# Patient Record
Sex: Female | Born: 1950 | Race: White | Hispanic: No | Smoking: Current every day smoker
Health system: Southern US, Community
[De-identification: ages and names within clinical notes are randomized; demographics above are authoritative.]

## PROBLEM LIST (undated history)

## (undated) DIAGNOSIS — I10 Essential (primary) hypertension: Secondary | ICD-10-CM

## (undated) DIAGNOSIS — E785 Hyperlipidemia, unspecified: Secondary | ICD-10-CM

## (undated) DIAGNOSIS — F419 Anxiety disorder, unspecified: Secondary | ICD-10-CM

## (undated) DIAGNOSIS — F329 Major depressive disorder, single episode, unspecified: Secondary | ICD-10-CM

## (undated) DIAGNOSIS — F32A Depression, unspecified: Secondary | ICD-10-CM

## (undated) DIAGNOSIS — M1990 Unspecified osteoarthritis, unspecified site: Secondary | ICD-10-CM

## (undated) DIAGNOSIS — F4321 Adjustment disorder with depressed mood: Secondary | ICD-10-CM

## (undated) HISTORY — PX: CHOLECYSTECTOMY: SHX55

---

## 2003-03-27 ENCOUNTER — Emergency Department (HOSPITAL_COMMUNITY): Admission: EM | Admit: 2003-03-27 | Discharge: 2003-03-27 | Payer: Self-pay | Admitting: Emergency Medicine

## 2006-02-13 ENCOUNTER — Encounter: Admission: RE | Admit: 2006-02-13 | Discharge: 2006-02-13 | Payer: Self-pay | Admitting: Specialist

## 2006-03-06 ENCOUNTER — Encounter: Admission: RE | Admit: 2006-03-06 | Discharge: 2006-03-06 | Payer: Self-pay | Admitting: Family Medicine

## 2007-01-22 ENCOUNTER — Encounter: Admission: RE | Admit: 2007-01-22 | Discharge: 2007-01-22 | Payer: Self-pay | Admitting: Family Medicine

## 2007-04-12 ENCOUNTER — Encounter: Admission: RE | Admit: 2007-04-12 | Discharge: 2007-04-12 | Payer: Self-pay | Admitting: Family Medicine

## 2007-04-30 ENCOUNTER — Encounter: Admission: RE | Admit: 2007-04-30 | Discharge: 2007-04-30 | Payer: Self-pay | Admitting: Family Medicine

## 2007-08-13 ENCOUNTER — Inpatient Hospital Stay (HOSPITAL_COMMUNITY): Admission: EM | Admit: 2007-08-13 | Discharge: 2007-08-20 | Payer: Self-pay | Admitting: Emergency Medicine

## 2007-09-13 ENCOUNTER — Encounter: Admission: RE | Admit: 2007-09-13 | Discharge: 2007-09-13 | Payer: Self-pay | Admitting: Family Medicine

## 2008-04-24 ENCOUNTER — Emergency Department (HOSPITAL_COMMUNITY): Admission: EM | Admit: 2008-04-24 | Discharge: 2008-04-24 | Payer: Self-pay | Admitting: Emergency Medicine

## 2008-05-06 ENCOUNTER — Encounter: Admission: RE | Admit: 2008-05-06 | Discharge: 2008-05-06 | Payer: Self-pay | Admitting: Family Medicine

## 2008-10-20 ENCOUNTER — Encounter: Admission: RE | Admit: 2008-10-20 | Discharge: 2008-10-20 | Payer: Self-pay | Admitting: Family Medicine

## 2008-11-19 ENCOUNTER — Encounter: Admission: RE | Admit: 2008-11-19 | Discharge: 2008-11-19 | Payer: Self-pay | Admitting: Family Medicine

## 2008-11-19 ENCOUNTER — Inpatient Hospital Stay (HOSPITAL_COMMUNITY): Admission: EM | Admit: 2008-11-19 | Discharge: 2008-11-22 | Payer: Self-pay | Admitting: Emergency Medicine

## 2008-11-19 ENCOUNTER — Ambulatory Visit: Payer: Self-pay | Admitting: Cardiology

## 2008-11-21 ENCOUNTER — Encounter (INDEPENDENT_AMBULATORY_CARE_PROVIDER_SITE_OTHER): Payer: Self-pay | Admitting: Internal Medicine

## 2008-12-12 ENCOUNTER — Encounter: Admission: RE | Admit: 2008-12-12 | Discharge: 2008-12-12 | Payer: Self-pay | Admitting: Family Medicine

## 2009-07-08 ENCOUNTER — Encounter: Admission: RE | Admit: 2009-07-08 | Discharge: 2009-07-08 | Payer: Self-pay | Admitting: Family Medicine

## 2009-11-09 ENCOUNTER — Emergency Department (HOSPITAL_COMMUNITY): Admission: EM | Admit: 2009-11-09 | Discharge: 2009-11-09 | Payer: Self-pay | Admitting: Emergency Medicine

## 2010-02-28 ENCOUNTER — Encounter: Payer: Self-pay | Admitting: Family Medicine

## 2010-03-01 ENCOUNTER — Encounter: Payer: Self-pay | Admitting: Family Medicine

## 2010-05-13 LAB — HEPATIC FUNCTION PANEL
ALT: 28 U/L (ref 0–35)
AST: 40 U/L — ABNORMAL HIGH (ref 0–37)
Albumin: 3.6 g/dL (ref 3.5–5.2)
Alkaline Phosphatase: 20 U/L — ABNORMAL LOW (ref 39–117)
Bilirubin, Direct: <0.1 mg/dL (ref 0.0–0.3)
Total Bilirubin: 0.3 mg/dL (ref 0.3–1.2)
Total Protein: 6.4 g/dL (ref 6.0–8.3)

## 2010-05-13 LAB — COMPREHENSIVE METABOLIC PANEL
ALT: 26 U/L (ref 0–35)
ALT: 28 U/L (ref 0–35)
AST: 32 U/L (ref 0–37)
AST: 41 U/L — ABNORMAL HIGH (ref 0–37)
Albumin: 3.5 g/dL (ref 3.5–5.2)
Albumin: 3.8 g/dL (ref 3.5–5.2)
Alkaline Phosphatase: 17 U/L — ABNORMAL LOW (ref 39–117)
Alkaline Phosphatase: 22 U/L — ABNORMAL LOW (ref 39–117)
BUN: 7 mg/dL (ref 6–23)
BUN: 8 mg/dL (ref 6–23)
CO2: 27 mEq/L (ref 19–32)
CO2: 30 mEq/L (ref 19–32)
Calcium: 9.2 mg/dL (ref 8.4–10.5)
Calcium: 9.2 mg/dL (ref 8.4–10.5)
Chloride: 92 mEq/L — ABNORMAL LOW (ref 96–112)
Chloride: 97 mEq/L (ref 96–112)
Creatinine, Ser: 0.83 mg/dL (ref 0.4–1.2)
Creatinine, Ser: 1.02 mg/dL (ref 0.4–1.2)
GFR calc Af Amer: >60 mL/min (ref >60)
GFR calc Af Amer: >60 mL/min (ref >60)
GFR calc non Af Amer: 55 mL/min — ABNORMAL LOW (ref >60)
GFR calc non Af Amer: >60 mL/min (ref >60)
Glucose, Bld: 111 mg/dL — ABNORMAL HIGH (ref 70–99)
Glucose, Bld: 115 mg/dL — ABNORMAL HIGH (ref 70–99)
Potassium: 4 mEq/L (ref 3.5–5.1)
Potassium: 4.1 mEq/L (ref 3.5–5.1)
Sodium: 129 mEq/L — ABNORMAL LOW (ref 135–145)
Sodium: 135 mEq/L (ref 135–145)
Total Bilirubin: 0.4 mg/dL (ref 0.3–1.2)
Total Bilirubin: 0.4 mg/dL (ref 0.3–1.2)
Total Protein: 6.3 g/dL (ref 6.0–8.3)
Total Protein: 6.6 g/dL (ref 6.0–8.3)

## 2010-05-13 LAB — CBC
HCT: 37.1 % (ref 36.0–46.0)
HCT: 38.7 % (ref 36.0–46.0)
HCT: 39.1 % (ref 36.0–46.0)
Hemoglobin: 12.6 g/dL (ref 12.0–15.0)
Hemoglobin: 13.3 g/dL (ref 12.0–15.0)
Hemoglobin: 13.4 g/dL (ref 12.0–15.0)
MCHC: 34 g/dL (ref 30.0–36.0)
MCHC: 34.3 g/dL (ref 30.0–36.0)
MCHC: 34.3 g/dL (ref 30.0–36.0)
MCV: 93.6 fL (ref 78.0–100.0)
MCV: 94 fL (ref 78.0–100.0)
MCV: 94.5 fL (ref 78.0–100.0)
Platelets: 207 10*3/uL (ref 150–400)
Platelets: 213 10*3/uL (ref 150–400)
Platelets: 238 10*3/uL (ref 150–400)
RBC: 3.93 MIL/uL (ref 3.87–5.11)
RBC: 4.11 MIL/uL (ref 3.87–5.11)
RBC: 4.18 MIL/uL (ref 3.87–5.11)
RDW: 12.6 % (ref 11.5–15.5)
RDW: 12.7 % (ref 11.5–15.5)
RDW: 13.5 % (ref 11.5–15.5)
WBC: 11.1 10*3/uL — ABNORMAL HIGH (ref 4.0–10.5)
WBC: 7.1 10*3/uL (ref 4.0–10.5)
WBC: 7.5 10*3/uL (ref 4.0–10.5)

## 2010-05-13 LAB — GLUCOSE, CAPILLARY
Glucose-Capillary: 102 mg/dL — ABNORMAL HIGH (ref 70–99)
Glucose-Capillary: 97 mg/dL (ref 70–99)
Glucose-Capillary: 110 mg/dL — ABNORMAL HIGH (ref 70–99)
Glucose-Capillary: 114 mg/dL — ABNORMAL HIGH (ref 70–99)
Glucose-Capillary: 117 mg/dL — ABNORMAL HIGH (ref 70–99)
Glucose-Capillary: 118 mg/dL — ABNORMAL HIGH (ref 70–99)
Glucose-Capillary: 119 mg/dL — ABNORMAL HIGH (ref 70–99)
Glucose-Capillary: 125 mg/dL — ABNORMAL HIGH (ref 70–99)
Glucose-Capillary: 126 mg/dL — ABNORMAL HIGH (ref 70–99)
Glucose-Capillary: 127 mg/dL — ABNORMAL HIGH (ref 70–99)
Glucose-Capillary: 127 mg/dL — ABNORMAL HIGH (ref 70–99)
Glucose-Capillary: 92 mg/dL (ref 70–99)

## 2010-05-13 LAB — CK TOTAL AND CKMB (NOT AT ARMC)
CK, MB: 3.5 ng/mL (ref 0.3–4.0)
CK, MB: 4.2 ng/mL — ABNORMAL HIGH (ref 0.3–4.0)
CK, MB: 6.2 ng/mL — ABNORMAL HIGH (ref 0.3–4.0)
Relative Index: 1.6 (ref 0.0–2.5)
Relative Index: 1.9 (ref 0.0–2.5)
Relative Index: 2.1 (ref 0.0–2.5)
Total CK: 214 U/L — ABNORMAL HIGH (ref 7–177)
Total CK: 217 U/L — ABNORMAL HIGH (ref 7–177)
Total CK: 294 U/L — ABNORMAL HIGH (ref 7–177)

## 2010-05-13 LAB — URINALYSIS, ROUTINE W REFLEX MICROSCOPIC
Bilirubin Urine: NEGATIVE
Glucose, UA: NEGATIVE mg/dL
Hgb urine dipstick: NEGATIVE
Ketones, ur: NEGATIVE mg/dL
Nitrite: NEGATIVE
Protein, ur: NEGATIVE mg/dL
Specific Gravity, Urine: 1.008 (ref 1.005–1.030)
Urobilinogen, UA: 0.2 mg/dL (ref 0.0–1.0)
pH: 6 (ref 5.0–8.0)

## 2010-05-13 LAB — BASIC METABOLIC PANEL
BUN: 4 mg/dL — ABNORMAL LOW (ref 6–23)
CO2: 30 mEq/L (ref 19–32)
Calcium: 9 mg/dL (ref 8.4–10.5)
Chloride: 99 mEq/L (ref 96–112)
Creatinine, Ser: 0.8 mg/dL (ref 0.4–1.2)
GFR calc Af Amer: >60 mL/min (ref >60)
GFR calc non Af Amer: >60 mL/min (ref >60)
Glucose, Bld: 115 mg/dL — ABNORMAL HIGH (ref 70–99)
Potassium: 4.6 mEq/L (ref 3.5–5.1)
Sodium: 136 mEq/L (ref 135–145)

## 2010-05-13 LAB — DIFFERENTIAL
Basophils Absolute: 0.1 10*3/uL (ref 0.0–0.1)
Basophils Absolute: 0.1 10*3/uL (ref 0.0–0.1)
Basophils Relative: 1 % (ref 0–1)
Basophils Relative: 1 % (ref 0–1)
Eosinophils Absolute: 0.2 10*3/uL (ref 0.0–0.7)
Eosinophils Absolute: 0.2 10*3/uL (ref 0.0–0.7)
Eosinophils Relative: 2 % (ref 0–5)
Eosinophils Relative: 2 % (ref 0–5)
Lymphocytes Relative: 42 % (ref 12–46)
Lymphocytes Relative: 52 % — ABNORMAL HIGH (ref 12–46)
Lymphs Abs: 3.9 10*3/uL (ref 0.7–4.0)
Lymphs Abs: 4.7 10*3/uL — ABNORMAL HIGH (ref 0.7–4.0)
Monocytes Absolute: 0.5 10*3/uL (ref 0.1–1.0)
Monocytes Absolute: 0.6 10*3/uL (ref 0.1–1.0)
Monocytes Relative: 6 % (ref 3–12)
Monocytes Relative: 6 % (ref 3–12)
Neutro Abs: 2.9 10*3/uL (ref 1.7–7.7)
Neutro Abs: 5.5 10*3/uL (ref 1.7–7.7)
Neutrophils Relative %: 39 % — ABNORMAL LOW (ref 43–77)
Neutrophils Relative %: 50 % (ref 43–77)

## 2010-05-13 LAB — LIPID PANEL
Cholesterol: 68 mg/dL (ref 0–200)
HDL: 25 mg/dL — ABNORMAL LOW (ref >39)
LDL Cholesterol: 22 mg/dL (ref 0–99)
Total CHOL/HDL Ratio: 2.7 RATIO
Triglycerides: 103 mg/dL (ref <150)
VLDL: 21 mg/dL (ref 0–40)

## 2010-05-13 LAB — VITAMIN B12: Vitamin B-12: 502 pg/mL (ref 211–911)

## 2010-05-13 LAB — TSH: TSH: 2.588 u[IU]/mL (ref 0.350–4.500)

## 2010-05-13 LAB — CREATININE, SERUM
Creatinine, Ser: 0.93 mg/dL (ref 0.4–1.2)
GFR calc Af Amer: >60 mL/min (ref >60)
GFR calc non Af Amer: >60 mL/min (ref >60)

## 2010-05-13 LAB — APTT: aPTT: 32 seconds (ref 24–37)

## 2010-05-13 LAB — MAGNESIUM
Magnesium: 1.5 mg/dL (ref 1.5–2.5)
Magnesium: 2.2 mg/dL (ref 1.5–2.5)

## 2010-05-13 LAB — CORTISOL: Cortisol, Plasma: 2 ug/dL

## 2010-05-13 LAB — COPPER, URINE, 24 HOUR
Copper,Urine (24 Hr): 5 mcg/L (ref 2–30)
Creatinine, Urine mg/day-CUURI: 0.83 g/(24.h) (ref 0.63–2.50)
Total Volume - CURRI: 3300 mL

## 2010-05-13 LAB — CERULOPLASMIN: Ceruloplasmin: 30 mg/dL (ref 21–63)

## 2010-05-13 LAB — SODIUM, URINE, RANDOM: Sodium, Ur: <10 mEq/L

## 2010-05-13 LAB — CALCIUM: Calcium: 9 mg/dL (ref 8.4–10.5)

## 2010-05-13 LAB — URINE MICROSCOPIC-ADD ON

## 2010-05-13 LAB — TROPONIN I
Troponin I: 0.01 ng/mL (ref 0.00–0.06)
Troponin I: 0.02 ng/mL (ref 0.00–0.06)
Troponin I: 0.02 ng/mL (ref 0.00–0.06)

## 2010-05-13 LAB — ETHANOL: Alcohol, Ethyl (B): <5 mg/dL (ref 0–10)

## 2010-05-13 LAB — FOLATE RBC: RBC Folate: 806 ng/mL — ABNORMAL HIGH (ref 180–600)

## 2010-05-13 LAB — CREATININE, URINE, RANDOM: Creatinine, Urine: 59.7 mg/dL

## 2010-05-13 LAB — PHOSPHORUS: Phosphorus: 4.6 mg/dL (ref 2.3–4.6)

## 2010-05-13 LAB — PROTIME-INR
INR: 0.96 (ref 0.00–1.49)
Prothrombin Time: 12.7 seconds (ref 11.6–15.2)

## 2010-05-20 LAB — COMPREHENSIVE METABOLIC PANEL
ALT: 25 U/L (ref 0–35)
AST: 28 U/L (ref 0–37)
Albumin: 4 g/dL (ref 3.5–5.2)
Alkaline Phosphatase: 23 U/L — ABNORMAL LOW (ref 39–117)
BUN: 13 mg/dL (ref 6–23)
CO2: 25 mEq/L (ref 19–32)
Calcium: 9.3 mg/dL (ref 8.4–10.5)
Chloride: 98 mEq/L (ref 96–112)
Creatinine, Ser: 1.07 mg/dL (ref 0.4–1.2)
GFR calc Af Amer: >60 mL/min (ref >60)
GFR calc non Af Amer: 52 mL/min — ABNORMAL LOW (ref >60)
Glucose, Bld: 95 mg/dL (ref 70–99)
Potassium: 4 mEq/L (ref 3.5–5.1)
Sodium: 132 mEq/L — ABNORMAL LOW (ref 135–145)
Total Bilirubin: 0.6 mg/dL (ref 0.3–1.2)
Total Protein: 7.4 g/dL (ref 6.0–8.3)

## 2010-05-20 LAB — DIFFERENTIAL
Basophils Absolute: 0.1 10*3/uL (ref 0.0–0.1)
Basophils Relative: 1 % (ref 0–1)
Eosinophils Absolute: 0.3 10*3/uL (ref 0.0–0.7)
Eosinophils Relative: 4 % (ref 0–5)
Lymphocytes Relative: 44 % (ref 12–46)
Lymphs Abs: 3.6 10*3/uL (ref 0.7–4.0)
Monocytes Absolute: 0.4 10*3/uL (ref 0.1–1.0)
Monocytes Relative: 5 % (ref 3–12)
Neutro Abs: 3.8 10*3/uL (ref 1.7–7.7)
Neutrophils Relative %: 46 % (ref 43–77)

## 2010-05-20 LAB — URINALYSIS, ROUTINE W REFLEX MICROSCOPIC
Bilirubin Urine: NEGATIVE
Glucose, UA: NEGATIVE mg/dL
Hgb urine dipstick: NEGATIVE
Ketones, ur: NEGATIVE mg/dL
Nitrite: NEGATIVE
Protein, ur: NEGATIVE mg/dL
Specific Gravity, Urine: 1.011 (ref 1.005–1.030)
Urobilinogen, UA: 0.2 mg/dL (ref 0.0–1.0)
pH: 5.5 (ref 5.0–8.0)

## 2010-05-20 LAB — CBC
HCT: 41 % (ref 36.0–46.0)
Hemoglobin: 13.9 g/dL (ref 12.0–15.0)
MCHC: 33.9 g/dL (ref 30.0–36.0)
MCV: 92.7 fL (ref 78.0–100.0)
Platelets: 213 10*3/uL (ref 150–400)
RBC: 4.42 MIL/uL (ref 3.87–5.11)
RDW: 14 % (ref 11.5–15.5)
WBC: 8.2 10*3/uL (ref 4.0–10.5)

## 2010-05-20 LAB — GLUCOSE, CAPILLARY: Glucose-Capillary: 97 mg/dL (ref 70–99)

## 2010-06-22 NOTE — Discharge Summary (Signed)
NAME:  Alexandra Richardson, Alexandra Richardson NO.:  [id number]   MEDICAL RECORD NO.:  [id number]          PATIENT TYPE:  INP   LOCATION:  5532                         FACILITY:  [facility]   PHYSICIAN:  Hillery Aldo, M.D.   DATE OF BIRTH:  April 08, 1950   DATE OF ADMISSION:  08/13/2007  DATE OF DISCHARGE:  08/20/2007                               DISCHARGE SUMMARY   PRIMARY CARE PHYSICIAN:  Burnell Blanks, MD   DISCHARGE DIAGNOSES:  1. Left lower lobe pneumonia.  2. Diabetes mellitus.  3. Tobacco abuse.  4. Hyponatremia.  5. Obesity  6. Anxiety.   DISCHARGE MEDICATIONS:  1. Avelox 400 mg x3 more days.  2. Albuterol 2 puffs q.4 h p.r.n.  3. Mucinex 600 mg b.i.d. p.r.n.  4. Xanax 2 mg t.i.d., taper as tolerated.  5. Percocet 10 mg t.i.d., taper as tolerated.  6. Furosemide daily at preadmission dose.  7. Tricor 145 mg daily.  8. Metformin at preadmission dose daily.  9. Amitriptyline 75 mg nightly.  10.Klor-Con 10 mg daily.  11.Meloxicam 7.5 mg daily p.r.n.   CONSULTATIONS:  None.   BRIEF ADMISSION HPI:  The patient is a 60 year old female who presented  to the hospital with chief complaint of fever and cough.  She was found  to have evidence of pneumonia on initial evaluation, therefore was  admitted for IV antibiotic therapy.  For the full details, please see  the dictated report done by Dr. Orvan Falconer.   PROCEDURES AND DIAGNOSTIC STUDIES:  1. Chest x-ray on August 13, 2007, showed left lower lobe pneumonia.  2. Two views of the chest on August 13, 2007, showed left lower lobe      consolidation likely representative of pneumonia with a component      of atelectasis.  3. Two views of the chest on August 15, 2007 ,showed left lower lobe      pneumonia and probable small left parapneumonic effusion.  4. Chest x-ray on August 16, 2007, showed continued infiltrative density      and pleural and parenchymal opacities with atelectasis and      infiltrate involving the inferior left  hemithorax.   DISCHARGE LABORATORY VALUES:  H1, N1, PCR testing was negative.  CBC  done on August 18, 2007, showed a white blood cell count of 4.5,  hemoglobin 10.9, hematocrit 31.6, platelets 290.  Chemistries were  completely normal.  Blood cultures were negative.   HOSPITAL COURSE:  1. Left lower lobe pneumonia:  The patient was empirically put on      Avelox.  She was maintained on droplet precautions and H1, N1, PCR      swabs were sent off.  She was empirically treated with Tamiflu.      Nebulized bronchodilator therapy was provided due to her dyspnea.      She rapidly defervesced over the first 24-48 hours of her      admission, but continued to feel extremely lethargic.  She was      transitioned over to p.o. Avelox and gradually improved.  At this      point, she is medically stable and  has completed 6 days of therapy      with Avelox.  She will complete an additional 3 days of treatment      with antibiotics.  It has recommended that she have a follow-up      chest x-ray done as an outpatient to ensure resolution.  2. Diabetes:  The patient was well maintained on insulin during her      hospital stay.  She is instructed to resume her outpatient regimen      after discharge.  A hemoglobin A1c was checked and found to be 7.1%      indicating reasonable outpatient control.  3. Tobacco abuse:  The patient was counseled on the course of      cessation.  Nicotine patch was provided while she was in the      hospital.  4. Hyponatremia:  This resolved without any specific intervention      other than IV fluid rehydration.  5. Obesity:  The patient was maintained on a carbohydrate modified      diet and seen in consultation with the dietician.   DISPOSITION:  The patient is medically stable for discharge home.      Hillery Aldo, M.D.  Electronically Signed     CR/MEDQ  D:  08/20/2007  T:  08/21/2007  Job:  [id number]   cc:   Burnell Blanks, MD

## 2010-06-22 NOTE — H&P (Signed)
NAME:  AMITY, ROES NO.:  [id number]   MEDICAL RECORD NO.:  [id number]          PATIENT TYPE:  EMS   LOCATION:  MAJO                         FACILITY:  [facility]   PHYSICIAN:  Vania Rea, M.D. DATE OF BIRTH:  16-Dec-1950   DATE OF ADMISSION:  08/13/2007  DATE OF DISCHARGE:                              HISTORY & PHYSICAL   PRIMARY CARE PHYSICIAN:  Burnell Blanks, MD   CHIEF COMPLAINT:  Fever, cough, progressing x1 day.   HISTORY OF PRESENT ILLNESS:  This is a 60 year old Caucasian lady with a  history of diabetes type 2, controlled on oral medications, who was in  her baseline state of health until a week ago when she developed fever,  cough, shortness of breath which has been progressing until today she  started having nausea, vomiting, and worsening shortness of breath.  The  patient continues to smoke 1-1/2 packs of cigarettes per day.  The  patient came to the emergency room and was evaluated by the emergency  room physician and hospitalist service called to assist with management.   PAST MEDICAL HISTORY:  1. Diabetes type 2.  2. Depression.   MEDICATIONS:  Elavil unknown dose.  Tablet for her diabetes, which dose  she does not know.   SOCIAL HISTORY:  She smokes 1-1/2 packs of cigarettes per day for the  past 30 years.  Denies alcohol or illicit drug use.   ALLERGIES:  NO KNOWN DRUG ALLERGIES.   FAMILY HISTORY:  Significant for hypertension, diabetes, and coronary  artery disease in several members.   REVIEW OF SYSTEMS:  Other than the fever, cough, shortness of breath,  nausea progressive weakness, a 10-point review of systems was  unremarkable.  The patient says she checks her blood sugar about once  per week and it is usually about 140.   PHYSICAL EXAMINATION:  Ill-looking, obese, middle-aged Caucasian lady,  lying in stretcher.  VITAL SIGNS:  Temperature is 102.2  Pulse is 114.  Respiration 18.  She  is saturating at 90% on 2 L.  Her blood  pressure is 125/73.  HEENT:  Her pupils are round and equal.  Mucous membranes are pink and  anicteric.  She is somewhat dehydrated.  She has no jugular venous  distension, but she does have a thick neck.  No thyromegaly or  lymphadenopathy appreciated.  No carotid bruit.  CHEST:  She has coarse crackles and rhonchi on the left side of her  chest.  CARDIOVASCULAR:  She is tachycardic.  The rhythm is regular.  There are  no murmurs heard.  ABDOMEN:  Her abdomen is obese, soft, and nontender.  There are no  masses.  EXTREMITIES:  Without edema.  She has 3+ pulses bilaterally.  No bony  joint deformity.  SKIN:  No rash is observed.  CENTRAL NERVOUS SYSTEM:  Cranial nerves 2-12 are grossly intact.  She  has no focal neurologic deficit.   LABORATORY DATA:  Her CBC is remarkably normal with white count of 8.5,  hemoglobin 13.9.  Platelets 184.  Her ABGs shows pH of 7.39, pCO2 48,  pO2 89, saturating at  94% on 2 L.  Her sodium is 129, potassium 4.2,  chloride 97.  CO2 22.  BUN 1.3.  Glucose 149.   Chest x-ray shows left lower lobe infiltrate.   ASSESSMENT:  1. Left lower lobe pneumonia, hypocarbia.  2. Hyponatremia.  3. Diabetes type 2, control unknown.  4. Obesity.   PLAN:  1. Will admit this lady for treatment for pneumonia with intravenous      antibiotics after cultures of blood and sputum.  2. Hyponatremia, and lack of white count response suggests may be      viral, also Legionella.  Will check her urine Legionella antigen.      For the time being, will give her sliding scale coverage until her      diabetes control is better known.  Will check her A1c.  Will also      take the opportunity to check a fasting lipid panel and TSH screen.      Vania Rea, M.D.  Electronically Signed     LC/MEDQ  D:  08/13/2007  T:  08/13/2007  Job:  [id number]   cc:   Burnell Blanks, MD

## 2010-10-20 ENCOUNTER — Ambulatory Visit
Admission: RE | Admit: 2010-10-20 | Discharge: 2010-10-20 | Disposition: A | Discharge status: Home / Self Care | Payer: Medicare Other | Source: Ambulatory Visit | Attending: Family Medicine | Admitting: Family Medicine

## 2010-10-20 ENCOUNTER — Other Ambulatory Visit: Payer: Self-pay | Admitting: Family Medicine

## 2010-10-20 DIAGNOSIS — M25562 Pain in left knee: Secondary | ICD-10-CM

## 2010-10-20 DIAGNOSIS — M25561 Pain in right knee: Secondary | ICD-10-CM

## 2010-10-25 ENCOUNTER — Ambulatory Visit: Payer: Medicare Other | Attending: Anesthesiology | Admitting: Rehabilitative and Restorative Service Providers"

## 2010-10-25 DIAGNOSIS — IMO0001 Reserved for inherently not codable concepts without codable children: Secondary | ICD-10-CM | POA: Insufficient documentation

## 2010-10-25 DIAGNOSIS — R293 Abnormal posture: Secondary | ICD-10-CM | POA: Insufficient documentation

## 2010-10-25 DIAGNOSIS — M545 Low back pain, unspecified: Secondary | ICD-10-CM | POA: Insufficient documentation

## 2010-11-04 LAB — POCT I-STAT 3, ART BLOOD GAS (G3+)
Acid-Base Excess: 1
Bicarbonate: 26 — ABNORMAL HIGH
O2 Saturation: 94
Operator id: 287011
Patient temperature: 104
TCO2: 27
pCO2 arterial: 47.8 — ABNORMAL HIGH
pH, Arterial: 7.356
pO2, Arterial: 89

## 2010-11-04 LAB — DIFFERENTIAL
Basophils Absolute: 0
Basophils Absolute: 0
Basophils Absolute: 0
Basophils Relative: 1
Basophils Relative: 1
Basophils Relative: 1
Eosinophils Absolute: 0
Eosinophils Absolute: 0
Eosinophils Absolute: 0
Eosinophils Relative: 0
Eosinophils Relative: 0
Eosinophils Relative: 0
Lymphocytes Relative: 29
Lymphocytes Relative: 31
Lymphocytes Relative: 40
Lymphs Abs: 1.8
Lymphs Abs: 2.1
Lymphs Abs: 2.2
Monocytes Absolute: 0.3
Monocytes Absolute: 0.7
Monocytes Absolute: 0.8
Monocytes Relative: 11
Monocytes Relative: 13 — ABNORMAL HIGH
Monocytes Relative: 6
Neutro Abs: 2.5
Neutro Abs: 3.7
Neutro Abs: 4.1
Neutrophils Relative %: 47
Neutrophils Relative %: 59
Neutrophils Relative %: 62

## 2010-11-04 LAB — BASIC METABOLIC PANEL
BUN: 13
BUN: 17
BUN: 5 — ABNORMAL LOW
BUN: 9
CO2: 24
CO2: 27
CO2: 29
CO2: 29
Calcium: 8.1 — ABNORMAL LOW
Calcium: 8.3 — ABNORMAL LOW
Calcium: 8.5
Calcium: 8.6
Chloride: 103
Chloride: 106
Chloride: 97
Chloride: 98
Creatinine, Ser: 0.7
Creatinine, Ser: 0.91
Creatinine, Ser: 1
Creatinine, Ser: 1.07
GFR calc Af Amer: >60
GFR calc Af Amer: >60
GFR calc Af Amer: >60
GFR calc Af Amer: >60
GFR calc non Af Amer: 52 — ABNORMAL LOW
GFR calc non Af Amer: 57 — ABNORMAL LOW
GFR calc non Af Amer: >60
GFR calc non Af Amer: >60
Glucose, Bld: 108 — ABNORMAL HIGH
Glucose, Bld: 93
Glucose, Bld: 96
Glucose, Bld: 96
Potassium: 3.7
Potassium: 3.8
Potassium: 3.9
Potassium: 4.1
Sodium: 131 — ABNORMAL LOW
Sodium: 132 — ABNORMAL LOW
Sodium: 138
Sodium: 140

## 2010-11-04 LAB — CBC
HCT: 31.6 — ABNORMAL LOW
HCT: 32.4 — ABNORMAL LOW
HCT: 32.7 — ABNORMAL LOW
HCT: 34.4 — ABNORMAL LOW
HCT: 41.1
Hemoglobin: 10.9 — ABNORMAL LOW
Hemoglobin: 11 — ABNORMAL LOW
Hemoglobin: 11.4 — ABNORMAL LOW
Hemoglobin: 11.7 — ABNORMAL LOW
Hemoglobin: 13.9
MCHC: 33.5
MCHC: 33.7
MCHC: 33.9
MCHC: 34.4
MCHC: 35.2
MCV: 91.6
MCV: 91.9
MCV: 92
MCV: 92.1
MCV: 92.2
Platelets: 135 — ABNORMAL LOW
Platelets: 162
Platelets: 182
Platelets: 184
Platelets: 290
RBC: 3.45 — ABNORMAL LOW
RBC: 3.53 — ABNORMAL LOW
RBC: 3.55 — ABNORMAL LOW
RBC: 3.74 — ABNORMAL LOW
RBC: 4.46
RDW: 13.8
RDW: 14.1
RDW: 14.1
RDW: 14.5
RDW: 14.6
WBC: 4.5
WBC: 5.4
WBC: 5.9
WBC: 7
WBC: 8.5

## 2010-11-04 LAB — POCT I-STAT, CHEM 8
BUN: 22
Calcium, Ion: 1.15
Chloride: 97
Creatinine, Ser: 1.3 — ABNORMAL HIGH
Glucose, Bld: 149 — ABNORMAL HIGH
HCT: 44
Hemoglobin: 15
Potassium: 4.2
Sodium: 129 — ABNORMAL LOW
TCO2: 24

## 2010-11-04 LAB — LIPID PANEL
Cholesterol: 67
HDL: 11 — ABNORMAL LOW
LDL Cholesterol: 10
Total CHOL/HDL Ratio: 6.1
Triglycerides: 228 — ABNORMAL HIGH
VLDL: 46 — ABNORMAL HIGH

## 2010-11-04 LAB — MAGNESIUM
Magnesium: 2.2
Magnesium: 2.3
Magnesium: 2.4

## 2010-11-04 LAB — URINALYSIS, ROUTINE W REFLEX MICROSCOPIC
Bilirubin Urine: NEGATIVE
Glucose, UA: NEGATIVE
Hgb urine dipstick: NEGATIVE
Ketones, ur: NEGATIVE
Nitrite: NEGATIVE
Protein, ur: 100 — AB
Specific Gravity, Urine: 1.023
Urobilinogen, UA: 0.2
pH: 6

## 2010-11-04 LAB — CULTURE, BLOOD (ROUTINE X 2)
Culture: NO GROWTH
Culture: NO GROWTH

## 2010-11-04 LAB — URINE MICROSCOPIC-ADD ON

## 2010-11-04 LAB — TSH: TSH: 1.848 (ref 0.350–4.500)

## 2010-11-04 LAB — HEMOGLOBIN A1C
Hgb A1c MFr Bld: 7.1 — ABNORMAL HIGH
Mean Plasma Glucose: 175

## 2010-11-04 LAB — H1N1 SCREEN (PCR): H1N1 Virus Scrn: NOT DETECTED

## 2010-11-04 LAB — LEGIONELLA ANTIGEN, URINE: Legionella Antigen, Urine: NEGATIVE

## 2010-11-08 ENCOUNTER — Encounter: Payer: Medicare Other | Admitting: Rehabilitative and Restorative Service Providers"

## 2010-11-16 ENCOUNTER — Other Ambulatory Visit: Payer: Self-pay | Admitting: Family Medicine

## 2010-11-17 ENCOUNTER — Other Ambulatory Visit: Payer: Self-pay | Admitting: Family Medicine

## 2010-11-17 DIAGNOSIS — Z1231 Encounter for screening mammogram for malignant neoplasm of breast: Secondary | ICD-10-CM

## 2010-11-22 ENCOUNTER — Ambulatory Visit: Payer: Medicare Other | Admitting: Rehabilitative and Restorative Service Providers"

## 2010-11-25 ENCOUNTER — Encounter: Payer: Medicare Other | Admitting: Rehabilitative and Restorative Service Providers"

## 2010-12-02 ENCOUNTER — Ambulatory Visit: Payer: Medicare Other

## 2010-12-02 ENCOUNTER — Other Ambulatory Visit: Payer: Medicare Other

## 2010-12-28 ENCOUNTER — Other Ambulatory Visit: Payer: Medicare Other

## 2010-12-28 ENCOUNTER — Ambulatory Visit: Payer: Medicare Other

## 2011-07-01 ENCOUNTER — Encounter (HOSPITAL_COMMUNITY): Payer: Self-pay | Admitting: Emergency Medicine

## 2011-07-01 ENCOUNTER — Inpatient Hospital Stay (HOSPITAL_COMMUNITY)
Admission: EM | Admit: 2011-07-01 | Discharge: 2011-07-03 | DRG: 917 | Disposition: A | Discharge status: Home / Self Care | Payer: Medicare Other | Attending: Internal Medicine | Admitting: Internal Medicine

## 2011-07-01 DIAGNOSIS — N390 Urinary tract infection, site not specified: Secondary | ICD-10-CM | POA: Diagnosis present

## 2011-07-01 DIAGNOSIS — I10 Essential (primary) hypertension: Secondary | ICD-10-CM | POA: Diagnosis present

## 2011-07-01 DIAGNOSIS — F489 Nonpsychotic mental disorder, unspecified: Secondary | ICD-10-CM

## 2011-07-01 DIAGNOSIS — T426X1A Poisoning by other antiepileptic and sedative-hypnotic drugs, accidental (unintentional), initial encounter: Secondary | ICD-10-CM | POA: Diagnosis present

## 2011-07-01 DIAGNOSIS — Z79899 Other long term (current) drug therapy: Secondary | ICD-10-CM

## 2011-07-01 DIAGNOSIS — G92 Toxic encephalopathy: Secondary | ICD-10-CM | POA: Diagnosis present

## 2011-07-01 DIAGNOSIS — R4182 Altered mental status, unspecified: Secondary | ICD-10-CM | POA: Diagnosis present

## 2011-07-01 DIAGNOSIS — M129 Arthropathy, unspecified: Secondary | ICD-10-CM | POA: Diagnosis present

## 2011-07-01 DIAGNOSIS — G9340 Encephalopathy, unspecified: Secondary | ICD-10-CM | POA: Diagnosis present

## 2011-07-01 DIAGNOSIS — E785 Hyperlipidemia, unspecified: Secondary | ICD-10-CM | POA: Diagnosis present

## 2011-07-01 DIAGNOSIS — F4321 Adjustment disorder with depressed mood: Secondary | ICD-10-CM | POA: Diagnosis present

## 2011-07-01 DIAGNOSIS — F322 Major depressive disorder, single episode, severe without psychotic features: Secondary | ICD-10-CM

## 2011-07-01 DIAGNOSIS — T43201A Poisoning by unspecified antidepressants, accidental (unintentional), initial encounter: Secondary | ICD-10-CM | POA: Diagnosis present

## 2011-07-01 DIAGNOSIS — G929 Unspecified toxic encephalopathy: Secondary | ICD-10-CM | POA: Diagnosis present

## 2011-07-01 DIAGNOSIS — F411 Generalized anxiety disorder: Secondary | ICD-10-CM | POA: Diagnosis present

## 2011-07-01 DIAGNOSIS — E876 Hypokalemia: Secondary | ICD-10-CM | POA: Diagnosis not present

## 2011-07-01 DIAGNOSIS — T43294A Poisoning by other antidepressants, undetermined, initial encounter: Secondary | ICD-10-CM

## 2011-07-01 DIAGNOSIS — T50901A Poisoning by unspecified drugs, medicaments and biological substances, accidental (unintentional), initial encounter: Secondary | ICD-10-CM | POA: Diagnosis present

## 2011-07-01 DIAGNOSIS — F329 Major depressive disorder, single episode, unspecified: Secondary | ICD-10-CM | POA: Diagnosis present

## 2011-07-01 DIAGNOSIS — F32A Depression, unspecified: Secondary | ICD-10-CM | POA: Diagnosis present

## 2011-07-01 DIAGNOSIS — F419 Anxiety disorder, unspecified: Secondary | ICD-10-CM | POA: Diagnosis present

## 2011-07-01 DIAGNOSIS — F17200 Nicotine dependence, unspecified, uncomplicated: Secondary | ICD-10-CM | POA: Diagnosis present

## 2011-07-01 DIAGNOSIS — Y92009 Unspecified place in unspecified non-institutional (private) residence as the place of occurrence of the external cause: Secondary | ICD-10-CM

## 2011-07-01 DIAGNOSIS — G9341 Metabolic encephalopathy: Secondary | ICD-10-CM

## 2011-07-01 HISTORY — DX: Major depressive disorder, single episode, unspecified: F32.9

## 2011-07-01 HISTORY — DX: Unspecified osteoarthritis, unspecified site: M19.90

## 2011-07-01 HISTORY — DX: Anxiety disorder, unspecified: F41.9

## 2011-07-01 HISTORY — DX: Adjustment disorder with depressed mood: F43.21

## 2011-07-01 HISTORY — DX: Essential (primary) hypertension: I10

## 2011-07-01 HISTORY — DX: Depression, unspecified: F32.A

## 2011-07-01 HISTORY — DX: Hyperlipidemia, unspecified: E78.5

## 2011-07-01 LAB — CBC
HCT: 41.1 % (ref 36.0–46.0)
Hemoglobin: 13.7 g/dL (ref 12.0–15.0)
MCH: 29.4 pg (ref 26.0–34.0)
MCHC: 33.3 g/dL (ref 30.0–36.0)
MCV: 88.2 fL (ref 78.0–100.0)
Platelets: 245 10*3/uL (ref 150–400)
RBC: 4.66 MIL/uL (ref 3.87–5.11)
RDW: 13.6 % (ref 11.5–15.5)
WBC: 8.5 10*3/uL (ref 4.0–10.5)

## 2011-07-01 LAB — URINALYSIS, ROUTINE W REFLEX MICROSCOPIC
Bilirubin Urine: NEGATIVE
Glucose, UA: NEGATIVE mg/dL
Ketones, ur: NEGATIVE mg/dL
Nitrite: NEGATIVE
Protein, ur: NEGATIVE mg/dL
Specific Gravity, Urine: 1.011 (ref 1.005–1.030)
Urobilinogen, UA: 0.2 mg/dL (ref 0.0–1.0)
pH: 7.5 (ref 5.0–8.0)

## 2011-07-01 LAB — URINE MICROSCOPIC-ADD ON

## 2011-07-01 LAB — COMPREHENSIVE METABOLIC PANEL
ALT: 19 U/L (ref 0–35)
AST: 25 U/L (ref 0–37)
Albumin: 3.4 g/dL — ABNORMAL LOW (ref 3.5–5.2)
Alkaline Phosphatase: 27 U/L — ABNORMAL LOW (ref 39–117)
BUN: 10 mg/dL (ref 6–23)
CO2: 29 mEq/L (ref 19–32)
Calcium: 9.2 mg/dL (ref 8.4–10.5)
Chloride: 97 mEq/L (ref 96–112)
Creatinine, Ser: 0.75 mg/dL (ref 0.50–1.10)
GFR calc Af Amer: >90 mL/min (ref >90)
GFR calc non Af Amer: 89 mL/min — ABNORMAL LOW (ref >90)
Glucose, Bld: 99 mg/dL (ref 70–99)
Potassium: 4.1 mEq/L (ref 3.5–5.1)
Sodium: 135 mEq/L (ref 135–145)
Total Bilirubin: 0.3 mg/dL (ref 0.3–1.2)
Total Protein: 7 g/dL (ref 6.0–8.3)

## 2011-07-01 LAB — DIFFERENTIAL
Basophils Absolute: 0.1 10*3/uL (ref 0.0–0.1)
Basophils Relative: 1 % (ref 0–1)
Eosinophils Absolute: 0.3 10*3/uL (ref 0.0–0.7)
Eosinophils Relative: 3 % (ref 0–5)
Lymphocytes Relative: 38 % (ref 12–46)
Lymphs Abs: 3.3 10*3/uL (ref 0.7–4.0)
Monocytes Absolute: 0.4 10*3/uL (ref 0.1–1.0)
Monocytes Relative: 5 % (ref 3–12)
Neutro Abs: 4.5 10*3/uL (ref 1.7–7.7)
Neutrophils Relative %: 53 % (ref 43–77)

## 2011-07-01 LAB — ETHANOL: Alcohol, Ethyl (B): <11 mg/dL (ref 0–11)

## 2011-07-01 LAB — RAPID URINE DRUG SCREEN, HOSP PERFORMED
Amphetamines: NOT DETECTED
Barbiturates: NOT DETECTED
Benzodiazepines: POSITIVE — AB
Cocaine: NOT DETECTED
Opiates: NOT DETECTED
Tetrahydrocannabinol: NOT DETECTED

## 2011-07-01 LAB — ACETAMINOPHEN LEVEL: Acetaminophen (Tylenol), Serum: <15 ug/mL (ref 10–30)

## 2011-07-01 LAB — SALICYLATE LEVEL: Salicylate Lvl: <2 mg/dL — ABNORMAL LOW (ref 2.8–20.0)

## 2011-07-01 MED ORDER — ONDANSETRON HCL 4 MG PO TABS: 4.0000 mg | ORAL_TABLET | Freq: Four times a day (QID) | ORAL | Status: DC | PRN

## 2011-07-01 MED ORDER — SODIUM CHLORIDE 0.9 % IV SOLN
INTRAVENOUS | Status: DC
  Administered 2011-07-02: 06:00:00 via INTRAVENOUS

## 2011-07-01 MED ORDER — ONDANSETRON HCL 4 MG/2ML IJ SOLN: 4.0000 mg | Freq: Four times a day (QID) | INTRAMUSCULAR | Status: DC | PRN

## 2011-07-01 MED ORDER — ENOXAPARIN SODIUM 40 MG/0.4ML ~~LOC~~ SOLN
40.0000 mg | SUBCUTANEOUS | Status: DC
  Administered 2011-07-01 – 2011-07-02 (×2): 40 mg via SUBCUTANEOUS
  Filled 2011-07-01 (×3): qty 0.4

## 2011-07-01 MED ORDER — ACETAMINOPHEN 650 MG RE SUPP: 650.0000 mg | Freq: Four times a day (QID) | RECTAL | Status: DC | PRN

## 2011-07-01 MED ORDER — ALUM & MAG HYDROXIDE-SIMETH 200-200-20 MG/5ML PO SUSP: 30.0000 mL | Freq: Four times a day (QID) | ORAL | Status: DC | PRN

## 2011-07-01 MED ORDER — ACETAMINOPHEN 325 MG PO TABS: 650.0000 mg | ORAL_TABLET | Freq: Four times a day (QID) | ORAL | Status: DC | PRN

## 2011-07-01 NOTE — ED Notes (Signed)
Pt. in blue scrubs and red socks.  

## 2011-07-01 NOTE — ED Notes (Signed)
MD at bedside. 

## 2011-07-01 NOTE — H&P (Signed)
DATE OF ADMISSION:  07/01/2011  PCP:   Ailene Ravel, MD, MD   Chief Complaint:  Confusion and Light headed   HPI: Alexandra Richardson is an 61 y.o. female who was brought to the ED by her sister for a possible overdose on her medications,Lyrica and Lexapro.  It is not clear how many the patient has taken, and she is drowsy but reports only taking what she had been prescribed and no more than that.  She reports having feeling of worthlessness and feeling ugly and Obese for a long time.  She has had a depressed mood since the loss of her granddaughter 2 years ago.  She denies suicidal ideation at this time . She also denies homicidal ideation at this time.  She denies any prior attempt at suicide.  She was evaluated in the ED and was somnolent.  Poison Control was contacted and the medicationsiscussed and recommendations were made to monitor patient for cardioascular changes and possible seizure activity, and patient has been placed on suicide precautions until an evaluation by psychiatry once she is medically stable.    Past Medical History  Diagnosis Date  . Hypertension   . Hyperlipidemia   . Depression   . Anxiety   . Arthritis     Past Surgical History  Procedure Date  . Cholecystectomy     Medications:  HOME MEDS: Prior to Admission medications   Medication Sig Start Date End Date Taking? Authorizing Provider  albuterol (PROVENTIL HFA;VENTOLIN HFA) 108 (90 BASE) MCG/ACT inhaler Inhale 2 puffs into the lungs every 6 (six) hours as needed. For shortness of breath.   Yes Historical Provider, MD  alprazolam Prudy Feeler) 2 MG tablet Take 2 mg by mouth 3 (three) times daily as needed. For anxiety.   Yes Historical Provider, MD  atorvastatin (LIPITOR) 40 MG tablet Take 40 mg by mouth daily.   Yes Historical Provider, MD  escitalopram (LEXAPRO) 20 MG tablet Take 40 mg by mouth daily.   Yes Historical Provider, MD  furosemide (LASIX) 40 MG tablet Take 40 mg by mouth daily.   Yes Historical  Provider, MD  omeprazole (PRILOSEC) 20 MG capsule Take 20 mg by mouth daily.   Yes Historical Provider, MD  Oxycodone-Acetaminophen (PERCOCET PO) Take 1 tablet by mouth every 6 (six) hours as needed. For pain.   Yes Historical Provider, MD  risperiDONE (RISPERDAL) 2 MG tablet Take 2 mg by mouth 2 (two) times daily.   Yes Historical Provider, MD    Allergies:  Allergies  Allergen Reactions  . Codeine Nausea And Vomiting    Social History:   reports that she has been smoking.  She does not have any smokeless tobacco history on file. She reports that she drinks alcohol. She reports that she does not use illicit drugs.  Family History: Family History  Problem Relation Age of Onset  . Coronary artery disease Father   . Diabetes type II Sister   . Hypertension Mother     Review of Systems:  The patient denies anorexia, fever, weight loss, vision loss, decreased hearing, hoarseness, chest pain, syncope, dyspnea on exertion, peripheral edema, balance deficits, hemoptysis, abdominal pain, melena, hematochezia, severe indigestion/heartburn, hematuria, incontinence, genital sores, muscle weakness, suspicious skin lesions, transient blindness, difficulty walking, depression, unusual weight change, abnormal bleeding, enlarged lymph nodes, angioedema, and breast masses.   Physical Exam:  GEN:  Depressed and somnolent and lethargic 61 year old Caucasian female examined  and in no acute distress; cooperative with exam Filed Vitals:  07/01/11 1449 07/01/11 1807 07/01/11 2201  BP: 137/71  130/74  Pulse: 71 79 72  Temp: 98.3 F (36.8 C)  98.1 F (36.7 C)  TempSrc: Oral  Oral  Resp: 20 15 12   SpO2: 93% 91% 97%   Blood pressure 130/74, pulse 72, temperature 98.1 F (36.7 C), temperature source Oral, resp. rate 12, SpO2 97.00%. PSYCH: SHe is alert and oriented x4; does not appear anxious does not appear depressed; affect is normal HEENT: Normocephalic and Atraumatic, Mucous membranes pink;  PERRLA; EOM intact; Fundi:  Benign;  No scleral icterus, Nares: Patent, Oropharynx: Clear, Edentulous or Fair Dentition, Neck:  FROM, no cervical lymphadenopathy nor thyromegaly or carotid bruit; no JVD; Breasts:: Not examined CHEST WALL: No tenderness CHEST: Normal respiration, clear to auscultation bilaterally HEART: Regular rate and rhythm; no murmurs rubs or gallops BACK: No kyphosis or scoliosis; no CVA tenderness ABDOMEN: Positive Bowel Sounds, Scaphoid, Obese, soft non-tender; no masses, no organomegaly, no pannus; no intertriginous candida. Rectal Exam: Not done EXTREMITIES: No bone or joint deformity; age-appropriate arthropathy of the hands and knees; no cyanosis, clubbing or edema; no ulcerations. Genitalia: not examined PULSES: 2+ and symmetric SKIN: Normal hydration no rash or ulceration CNS: Cranial nerves 2-12 grossly intact no focal neurologic deficit   Labs & Imaging Results for orders placed during the hospital encounter of 07/01/11 (from the past 48 hour(s))  CBC     Status: Normal   Collection Time   07/01/11  3:44 PM      Component Value Range Comment   WBC 8.5  4.0 - 10.5 (K/uL)    RBC 4.66  3.87 - 5.11 (MIL/uL)    Hemoglobin 13.7  12.0 - 15.0 (g/dL)    HCT 40.9  81.1 - 91.4 (%)    MCV 88.2  78.0 - 100.0 (fL)    MCH 29.4  26.0 - 34.0 (pg)    MCHC 33.3  30.0 - 36.0 (g/dL)    RDW 78.2  95.6 - 21.3 (%)    Platelets 245  150 - 400 (K/uL)   DIFFERENTIAL     Status: Normal   Collection Time   07/01/11  3:44 PM      Component Value Range Comment   Neutrophils Relative 53  43 - 77 (%)    Neutro Abs 4.5  1.7 - 7.7 (K/uL)    Lymphocytes Relative 38  12 - 46 (%)    Lymphs Abs 3.3  0.7 - 4.0 (K/uL)    Monocytes Relative 5  3 - 12 (%)    Monocytes Absolute 0.4  0.1 - 1.0 (K/uL)    Eosinophils Relative 3  0 - 5 (%)    Eosinophils Absolute 0.3  0.0 - 0.7 (K/uL)    Basophils Relative 1  0 - 1 (%)    Basophils Absolute 0.1  0.0 - 0.1 (K/uL)   COMPREHENSIVE METABOLIC  PANEL     Status: Abnormal   Collection Time   07/01/11  3:44 PM      Component Value Range Comment   Sodium 135  135 - 145 (mEq/L)    Potassium 4.1  3.5 - 5.1 (mEq/L)    Chloride 97  96 - 112 (mEq/L)    CO2 29  19 - 32 (mEq/L)    Glucose, Bld 99  70 - 99 (mg/dL)    BUN 10  6 - 23 (mg/dL)    Creatinine, Ser 0.86  0.50 - 1.10 (mg/dL)    Calcium 9.2  8.4 - 10.5 (  mg/dL)    Total Protein 7.0  6.0 - 8.3 (g/dL)    Albumin 3.4 (*) 3.5 - 5.2 (g/dL)    AST 25  0 - 37 (U/L)    ALT 19  0 - 35 (U/L)    Alkaline Phosphatase 27 (*) 39 - 117 (U/L)    Total Bilirubin 0.3  0.3 - 1.2 (mg/dL)    GFR calc non Af Amer 89 (*) >90 (mL/min)    GFR calc Af Amer >90  >90 (mL/min)   ETHANOL     Status: Normal   Collection Time   07/01/11  3:44 PM      Component Value Range Comment   Alcohol, Ethyl (B) <11  0 - 11 (mg/dL)   ACETAMINOPHEN LEVEL     Status: Normal   Collection Time   07/01/11  3:44 PM      Component Value Range Comment   Acetaminophen (Tylenol), Serum <15.0  10 - 30 (ug/mL)   SALICYLATE LEVEL     Status: Abnormal   Collection Time   07/01/11  3:44 PM      Component Value Range Comment   Salicylate Lvl <2.0 (*) 2.8 - 20.0 (mg/dL)   URINALYSIS, ROUTINE W REFLEX MICROSCOPIC     Status: Abnormal   Collection Time   07/01/11  4:07 PM      Component Value Range Comment   Color, Urine YELLOW  YELLOW     APPearance CLOUDY (*) CLEAR     Specific Gravity, Urine 1.011  1.005 - 1.030     pH 7.5  5.0 - 8.0     Glucose, UA NEGATIVE  NEGATIVE (mg/dL)    Hgb urine dipstick SMALL (*) NEGATIVE     Bilirubin Urine NEGATIVE  NEGATIVE     Ketones, ur NEGATIVE  NEGATIVE (mg/dL)    Protein, ur NEGATIVE  NEGATIVE (mg/dL)    Urobilinogen, UA 0.2  0.0 - 1.0 (mg/dL)    Nitrite NEGATIVE  NEGATIVE     Leukocytes, UA LARGE (*) NEGATIVE    URINE RAPID DRUG SCREEN (HOSP PERFORMED)     Status: Abnormal   Collection Time   07/01/11  4:07 PM      Component Value Range Comment   Opiates NONE DETECTED  NONE DETECTED      Cocaine NONE DETECTED  NONE DETECTED     Benzodiazepines POSITIVE (*) NONE DETECTED     Amphetamines NONE DETECTED  NONE DETECTED     Tetrahydrocannabinol NONE DETECTED  NONE DETECTED     Barbiturates NONE DETECTED  NONE DETECTED    URINE MICROSCOPIC-ADD ON     Status: Abnormal   Collection Time   07/01/11  4:07 PM      Component Value Range Comment   Squamous Epithelial / LPF FEW (*) RARE     WBC, UA TOO NUMEROUS TO COUNT  <3 (WBC/hpf)    RBC / HPF 3-6  <3 (RBC/hpf)    No results found.    Assessment: Present on Admission:  .Overdose .Altered mental status .Hypertension .Depression   UTI  Plan:    Admit to Stepdown Bed Suicide Precautions Cardiovascular monitoring, and Neurologic checks NPO while Obtunded IVFs Evaluate Home Meds Tobacco Cessation Counseling. Pyschiatry Evaluation once medically stable for Options and Care Urine C+S ordered, and IV Rocephin daily until results of Culture.   Other plans as per orders.    CODE STATUS:      FULL CODE       Critical care time:  60 minutes.   Ayrton Mcvay C 07/01/2011, 10:40 PM

## 2011-07-01 NOTE — ED Notes (Signed)
Pt is more lethargic at this time. Pt placed on oxygen 2 L [place]. Sitter is at bedside. Md Pickering at bedside notified and aware. Pt awaiting telepsych once she's more alert per md

## 2011-07-01 NOTE — ED Notes (Signed)
Pt cbg via ems 143

## 2011-07-01 NOTE — ED Notes (Signed)
Pt presenting to ed with c/o generalized weakness per ems pt's sister states pt took her (sister's) lyrica an unknown amount last night and unknown amount this am. Pt is alert and lethargic. Pt denies suicidal/homicidal ideation at this time. Pt is alert and oriented at this time. Pt states she did take her sister's lyrica she doesn't know why she states she didn't know she had it.

## 2011-07-01 NOTE — ED Provider Notes (Signed)
History     CSN: [id number]  Arrival date & time 07/01/11  1440   First MD Initiated Contact with Patient 07/01/11 1515      Chief Complaint  Patient presents with  . Medical Clearance  . Weakness   Level V caveat due to psychiatric disorder (Consider location/radiation/quality/duration/timing/severity/associated sxs/prior treatment) Patient is a 61 y.o. female presenting with weakness. The history is provided by the patient.  Weakness  Additional symptoms include weakness.   patient is here for generalized weakness per EMS. The patient reportedly took her sister's medicines. His been reported both that it was Lexapro and Lyrica. Patient initially told me Lexapro diagnosis workup. The patient states she doesn't know what she took. She states that she took it because she is on Percocet she ran out of them. She states ejection is to left because she needs to have 2 left for the pill count from her primary care doctor so she can keep getting them. She states she was not trying to hurt her self. No chest pain. No abdominal pain. No dysuria. No headache. She states that she took the pills both this morning and last night.  Past Medical History  Diagnosis Date  . Hypertension   . Hyperlipidemia   . Depression   . Anxiety   . Arthritis     Past Surgical History  Procedure Date  . Cholecystectomy     Family History  Problem Relation Age of Onset  . Coronary artery disease Father   . Diabetes type II Sister   . Hypertension Mother     History  Substance Use Topics  . Smoking status: Current Everyday Smoker -- 1.0 packs/day  . Smokeless tobacco: Not on file  . Alcohol Use: Yes     occasionally    OB History    Grav Para Term Preterm Abortions TAB SAB Ect Mult Living                  Review of Systems  Unable to perform ROS: Psychiatric disorder  Constitutional: Negative for chills.  Cardiovascular: Negative for chest pain.  Gastrointestinal: Negative for abdominal  pain.  Genitourinary: Negative for flank pain.  Musculoskeletal: Positive for back pain.  Neurological: Positive for weakness. Negative for numbness.  Psychiatric/Behavioral: Negative for behavioral problems.    Allergies  Codeine  Home Medications   No current outpatient prescriptions on file.  BP 158/81  Pulse 87  Temp(Src) 98.9 F (37.2 C) (Oral)  Resp 18  Ht 5\' 4"  (1.626 m)  Wt 194 lb 0.1 oz (88 kg)  BMI 33.30 kg/m2  SpO2 93%  Physical Exam  Nursing note and vitals reviewed. Constitutional: She appears well-developed and well-nourished.  HENT:  Head: Normocephalic and atraumatic.  Eyes: EOM are normal. Pupils are equal, round, and reactive to light.  Neck: Normal range of motion. Neck supple.  Cardiovascular: Normal rate, regular rhythm and normal heart sounds.   No murmur heard. Pulmonary/Chest: Effort normal and breath sounds normal. No respiratory distress. She has no wheezes. She has no rales.  Abdominal: Soft. Bowel sounds are normal. She exhibits no distension. There is no tenderness. There is no rebound and no guarding.  Musculoskeletal: Normal range of motion.  Neurological: She is alert. No cranial nerve deficit.       Patient is awake but somewhat sleepy.  Skin: Skin is warm and dry.  Psychiatric: Her speech is normal.    ED Course  Procedures (including critical care time)  Labs Reviewed  COMPREHENSIVE METABOLIC PANEL - Abnormal; Notable for the following:    Albumin 3.4 (*)    Alkaline Phosphatase 27 (*)    GFR calc non Af Amer 89 (*)    All other components within normal limits  URINALYSIS, ROUTINE W REFLEX MICROSCOPIC - Abnormal; Notable for the following:    APPearance CLOUDY (*)    Hgb urine dipstick SMALL (*)    Leukocytes, UA LARGE (*)    All other components within normal limits  URINE RAPID DRUG SCREEN (HOSP PERFORMED) - Abnormal; Notable for the following:    Benzodiazepines POSITIVE (*)    All other components within normal limits    SALICYLATE LEVEL - Abnormal; Notable for the following:    Salicylate Lvl <2.0 (*)    All other components within normal limits  URINE MICROSCOPIC-ADD ON - Abnormal; Notable for the following:    Squamous Epithelial / LPF FEW (*)    All other components within normal limits  BASIC METABOLIC PANEL - Abnormal; Notable for the following:    GFR calc non Af Amer 89 (*)    All other components within normal limits  CBC  DIFFERENTIAL  ETHANOL  ACETAMINOPHEN LEVEL  CBC  MRSA PCR SCREENING  MAGNESIUM  URINE CULTURE  MAGNESIUM  BASIC METABOLIC PANEL   No results found.   1. Overdose       MDM  Patient presents with an unknown overdose. Patient states it is either Lyrica or Lexapro. She states it was just because she was out of her pain medicine. Patient continues to be somewhat sleepy on reevaluation. Lab work is reassuring, although she does have white cells the urine. Urine culture was sent. Patient to telepsychiatry evaluation when she is more awake.        Juliet Rude. Rubin Payor, MD 07/02/11 2308

## 2011-07-01 NOTE — ED Notes (Signed)
[id number]> Expected date:<BR> Expected time: 2:29 PM<BR> Means of arrival:<BR> Comments:<BR> RAND [id number] - 60yoF General weakness all day

## 2011-07-01 NOTE — BH Assessment (Signed)
Assessment Note   Alexandra Richardson is an 61 y.o. female. Pt presents with ems after taking an overdose of 4 larica to deal with pain. Pt states he has arthritis all over body & cant handle it. Pt admits to increase depression & tend to abuse pain meds to deal with stresses especially pain. Pt says she is still grieving over the death of grand daughter who died about a year ago. Pt states she has went for outpt tx but It has not helped. Pt denies any ideation & says if she was going to kill herself she would have done it long time ago. Pt is emotional  & tearful during assessment. Pt had insisted that she was not trying to kill self but was taking the pain pills to kill self. Sister & Mom denies pt trying to kill self & are able to contract pt for  Safety. Pt is able to contract for safety & has refused any tx or hospitalization. EDP has ordered a Telepsych once cleared & is pending.    Axis I: Substance Induced Mood Disorder and Opioid Dependence Axis II: Deferred Axis III:  Past Medical History  Diagnosis Date  . Hypertension   . Hyperlipidemia   . Depression   . Anxiety   . Arthritis    Axis IV: Grief & medical Issues Axis V: 41-50 serious symptoms  Past Medical History:  Past Medical History  Diagnosis Date  . Hypertension   . Hyperlipidemia   . Depression   . Anxiety   . Arthritis     History reviewed. No pertinent past surgical history.  Family History: No family history on file.  Social History:  reports that she has been smoking.  She does not have any smokeless tobacco history on file. She reports that she drinks alcohol. She reports that she does not use illicit drugs.  Additional Social History:     CIWA: CIWA-Ar BP: 137/71 mmHg Pulse Rate: 79  COWS:    Allergies:  Allergies  Allergen Reactions  . Codeine Nausea And Vomiting    Home Medications:  (Not in a hospital admission)  OB/GYN Status:  No LMP recorded. Patient is postmenopausal.  General  Assessment Data Location of Assessment: [facility] ACT Assessment: Yes Living Arrangements: Other relatives Can pt return to current living arrangement?: Yes Admission Status: Voluntary Is patient capable of signing voluntary admission?: Yes Transfer from: Acute Hospital Referral Source: MD     Risk to self Suicidal Ideation: No Suicidal Intent: No Is patient at risk for suicide?: No Suicidal Plan?: No Access to Means: No What has been your use of drugs/alcohol within the last 12 months?: pt admits been dependent on pain pills cause she is constantly in pain Previous Attempts/Gestures: No How many times?: 0  Other Self Harm Risks: na Triggers for Past Attempts: Unpredictable Intentional Self Injurious Behavior: None Family Suicide History: No Recent stressful life event(s): Other (Comment) (sa) Persecutory voices/beliefs?: No Depression: No Depression Symptoms: Loss of interest in usual pleasures Substance abuse history and/or treatment for substance abuse?: No Suicide prevention information given to non-admitted patients: Not applicable  Risk to Others Homicidal Ideation: No Thoughts of Harm to Others: No Current Homicidal Intent: No Current Homicidal Plan: No Access to Homicidal Means: No Identified Victim: na History of harm to others?: No Assessment of Violence: None Noted Violent Behavior Description: agitated but cooperative Does patient have access to weapons?: No Criminal Charges Pending?: No Does patient have a court date: No  Psychosis  Hallucinations: None noted Delusions: None noted  Mental Status Report Appear/Hygiene: Improved Eye Contact: Poor Motor Activity: Freedom of movement Speech: Logical/coherent Level of Consciousness: Alert;Irritable;Sleeping Mood: Depressed;Anhedonia;Irritable;Angry Affect: Appropriate to circumstance;Irritable;Depressed Anxiety Level: None Thought Processes: Coherent;Relevant Judgement: Unimpaired Orientation:  Person;Place;Time;Situation Obsessive Compulsive Thoughts/Behaviors: None  Cognitive Functioning Concentration: Decreased Memory: Recent Intact;Remote Intact IQ: Average Insight: Poor Impulse Control: Poor Appetite: Fair Weight Loss: 0  Weight Gain: 0  Sleep: No Change Total Hours of Sleep: 8  Vegetative Symptoms: None  ADLScreening [facility] Assessment Services) Patient's cognitive ability adequate to safely complete daily activities?: Yes Patient able to express need for assistance with ADLs?: Yes Independently performs ADLs?: Yes  Abuse/Neglect [facility]) Physical Abuse: Denies Verbal Abuse: Denies Sexual Abuse: Denies  Prior Inpatient Therapy Prior Inpatient Therapy: Yes Prior Therapy Dates: unk Prior Therapy Facilty/Provider(s): dorthea dix Reason for Treatment: stabilization  Prior Outpatient Therapy Prior Outpatient Therapy: No Prior Therapy Dates: na Prior Therapy Facilty/Provider(s): na Reason for Treatment: na  ADL Screening (condition at time of admission) Patient's cognitive ability adequate to safely complete daily activities?: Yes Patient able to express need for assistance with ADLs?: Yes Independently performs ADLs?: Yes       Abuse/Neglect Assessment (Assessment to be complete while patient is alone) Physical Abuse: Denies Verbal Abuse: Denies Sexual Abuse: Denies          Additional Information 1:1 In Past 12 Months?: No CIRT Risk: No Elopement Risk: No Does patient have medical clearance?: No     Disposition:  Disposition Disposition of Patient: Treatment offered and refused;Referred to (pending telepsych) Type of treatment offered and refused: In-patient  On Site Evaluation by:   Reviewed with Physician:     Waldron Session 07/01/2011 9:38 PM

## 2011-07-01 NOTE — ED Notes (Signed)
Pt. has 1 bag of belongings located behind nurses station

## 2011-07-01 NOTE — ED Notes (Signed)
Sitter at bedside.

## 2011-07-01 NOTE — ED Notes (Signed)
Pt. and belongings both wanded by security 

## 2011-07-02 ENCOUNTER — Encounter (HOSPITAL_COMMUNITY): Payer: Self-pay

## 2011-07-02 DIAGNOSIS — G9340 Encephalopathy, unspecified: Secondary | ICD-10-CM | POA: Diagnosis present

## 2011-07-02 DIAGNOSIS — F322 Major depressive disorder, single episode, severe without psychotic features: Secondary | ICD-10-CM

## 2011-07-02 DIAGNOSIS — F489 Nonpsychotic mental disorder, unspecified: Secondary | ICD-10-CM

## 2011-07-02 DIAGNOSIS — F329 Major depressive disorder, single episode, unspecified: Secondary | ICD-10-CM

## 2011-07-02 DIAGNOSIS — T43294A Poisoning by other antidepressants, undetermined, initial encounter: Secondary | ICD-10-CM

## 2011-07-02 DIAGNOSIS — F419 Anxiety disorder, unspecified: Secondary | ICD-10-CM | POA: Diagnosis present

## 2011-07-02 DIAGNOSIS — F3289 Other specified depressive episodes: Secondary | ICD-10-CM

## 2011-07-02 DIAGNOSIS — G9341 Metabolic encephalopathy: Secondary | ICD-10-CM

## 2011-07-02 DIAGNOSIS — E785 Hyperlipidemia, unspecified: Secondary | ICD-10-CM | POA: Diagnosis present

## 2011-07-02 LAB — CBC
HCT: 40.2 % (ref 36.0–46.0)
Hemoglobin: 13.1 g/dL (ref 12.0–15.0)
MCH: 28.7 pg (ref 26.0–34.0)
MCHC: 32.6 g/dL (ref 30.0–36.0)
MCV: 88.2 fL (ref 78.0–100.0)
Platelets: 249 10*3/uL (ref 150–400)
RBC: 4.56 MIL/uL (ref 3.87–5.11)
RDW: 13.7 % (ref 11.5–15.5)
WBC: 9.2 10*3/uL (ref 4.0–10.5)

## 2011-07-02 LAB — BASIC METABOLIC PANEL
BUN: 11 mg/dL (ref 6–23)
CO2: 29 mEq/L (ref 19–32)
Calcium: 8.9 mg/dL (ref 8.4–10.5)
Chloride: 99 mEq/L (ref 96–112)
Creatinine, Ser: 0.75 mg/dL (ref 0.50–1.10)
GFR calc Af Amer: >90 mL/min (ref >90)
GFR calc non Af Amer: 89 mL/min — ABNORMAL LOW (ref >90)
Glucose, Bld: 99 mg/dL (ref 70–99)
Potassium: 3.8 mEq/L (ref 3.5–5.1)
Sodium: 137 mEq/L (ref 135–145)

## 2011-07-02 LAB — MAGNESIUM: Magnesium: 1.7 mg/dL (ref 1.5–2.5)

## 2011-07-02 LAB — MRSA PCR SCREENING: MRSA by PCR: NEGATIVE

## 2011-07-02 MED ORDER — MAGNESIUM SULFATE 40 MG/ML IJ SOLN
2.0000 g | Freq: Once | INTRAMUSCULAR | Status: AC
  Administered 2011-07-02: 2 g via INTRAVENOUS
  Filled 2011-07-02: qty 50

## 2011-07-02 MED ORDER — ATORVASTATIN CALCIUM 40 MG PO TABS
40.0000 mg | ORAL_TABLET | Freq: Every day | ORAL | Status: DC
  Administered 2011-07-02: 40 mg via ORAL
  Filled 2011-07-02 (×2): qty 1

## 2011-07-02 MED ORDER — FUROSEMIDE 40 MG PO TABS
40.0000 mg | ORAL_TABLET | Freq: Every day | ORAL | Status: DC
  Administered 2011-07-02 – 2011-07-03 (×2): 40 mg via ORAL
  Filled 2011-07-02 (×2): qty 1

## 2011-07-02 MED ORDER — LORAZEPAM 1 MG PO TABS
1.0000 mg | ORAL_TABLET | Freq: Three times a day (TID) | ORAL | Status: DC | PRN
  Administered 2011-07-02 – 2011-07-03 (×2): 1 mg via ORAL
  Filled 2011-07-02 (×2): qty 1

## 2011-07-02 MED ORDER — TRAMADOL HCL 50 MG PO TABS
50.0000 mg | ORAL_TABLET | Freq: Four times a day (QID) | ORAL | Status: DC | PRN
  Administered 2011-07-02: 50 mg via ORAL
  Filled 2011-07-02: qty 1

## 2011-07-02 MED ORDER — DEXTROSE 5 % IV SOLN
1.0000 g | Freq: Every day | INTRAVENOUS | Status: DC
  Administered 2011-07-02 – 2011-07-03 (×2): 1 g via INTRAVENOUS
  Filled 2011-07-02 (×3): qty 10

## 2011-07-02 MED ORDER — PANTOPRAZOLE SODIUM 40 MG PO TBEC
40.0000 mg | DELAYED_RELEASE_TABLET | Freq: Every day | ORAL | Status: DC
  Administered 2011-07-02 – 2011-07-03 (×2): 40 mg via ORAL
  Filled 2011-07-02 (×3): qty 1

## 2011-07-02 NOTE — Consult Note (Signed)
Reason for Consult: Depression and possible overdose on medication Referring Physician: Dr. [name] is an 61 y.o. female.  HPI: Patient was seen and chart reviewed. Patient was presented to the [facility] long emergency department with her sister for possible overdose on her medications feeling dizzy and sleepy at the presentation. Patient denied taking overdose on her medications. Patient reported she has been staying with her sister for the last 2 years and has been doing well without significant emotional or behavioral problems. Patient reported her grand daughter was murdered about 2 years ago and she has a limited contact with her daughter since then. She has been emotional from time to time and has been receiving medication management from primary care physician Dr. Nathanial Richardson. Patient denied current suicidal ideation, homicidal ideation, intentions and plan. Patient has no previous history of acute psychiatric hospitalizations are such suicidal attempts. Reportedly patient received counseling at hospice 2 years ago  Past Medical History  Diagnosis Date  . Hypertension   . Hyperlipidemia   . Depression   . Anxiety   . Arthritis     Past Surgical History  Procedure Date  . Cholecystectomy     Family History  Problem Relation Age of Onset  . Coronary artery disease Father   . Diabetes type II Sister   . Hypertension Mother     Social History:  reports that she has been smoking.  She does not have any smokeless tobacco history on file. She reports that she drinks alcohol. She reports that she does not use illicit drugs.  Allergies:  Allergies  Allergen Reactions  . Codeine Nausea And Vomiting    Medications: I have reviewed the patient's current medications.  Results for orders placed during the hospital encounter of 07/01/11 (from the past 48 hour(s))  CBC     Status: Normal   Collection Time   07/01/11  3:44 PM      Component Value Range Comment   WBC 8.5   4.0 - 10.5 (K/uL)    RBC 4.66  3.87 - 5.11 (MIL/uL)    Hemoglobin 13.7  12.0 - 15.0 (g/dL)    HCT 96.0  45.4 - 09.8 (%)    MCV 88.2  78.0 - 100.0 (fL)    MCH 29.4  26.0 - 34.0 (pg)    MCHC 33.3  30.0 - 36.0 (g/dL)    RDW 11.9  14.7 - 82.9 (%)    Platelets 245  150 - 400 (K/uL)   DIFFERENTIAL     Status: Normal   Collection Time   07/01/11  3:44 PM      Component Value Range Comment   Neutrophils Relative 53  43 - 77 (%)    Neutro Abs 4.5  1.7 - 7.7 (K/uL)    Lymphocytes Relative 38  12 - 46 (%)    Lymphs Abs 3.3  0.7 - 4.0 (K/uL)    Monocytes Relative 5  3 - 12 (%)    Monocytes Absolute 0.4  0.1 - 1.0 (K/uL)    Eosinophils Relative 3  0 - 5 (%)    Eosinophils Absolute 0.3  0.0 - 0.7 (K/uL)    Basophils Relative 1  0 - 1 (%)    Basophils Absolute 0.1  0.0 - 0.1 (K/uL)   COMPREHENSIVE METABOLIC PANEL     Status: Abnormal   Collection Time   07/01/11  3:44 PM      Component Value Range Comment   Sodium 135  135 -  145 (mEq/L)    Potassium 4.1  3.5 - 5.1 (mEq/L)    Chloride 97  96 - 112 (mEq/L)    CO2 29  19 - 32 (mEq/L)    Glucose, Bld 99  70 - 99 (mg/dL)    BUN 10  6 - 23 (mg/dL)    Creatinine, Ser 4.09  0.50 - 1.10 (mg/dL)    Calcium 9.2  8.4 - 10.5 (mg/dL)    Total Protein 7.0  6.0 - 8.3 (g/dL)    Albumin 3.4 (*) 3.5 - 5.2 (g/dL)    AST 25  0 - 37 (U/L)    ALT 19  0 - 35 (U/L)    Alkaline Phosphatase 27 (*) 39 - 117 (U/L)    Total Bilirubin 0.3  0.3 - 1.2 (mg/dL)    GFR calc non Af Amer 89 (*) >90 (mL/min)    GFR calc Af Amer >90  >90 (mL/min)   ETHANOL     Status: Normal   Collection Time   07/01/11  3:44 PM      Component Value Range Comment   Alcohol, Ethyl (B) <11  0 - 11 (mg/dL)   ACETAMINOPHEN LEVEL     Status: Normal   Collection Time   07/01/11  3:44 PM      Component Value Range Comment   Acetaminophen (Tylenol), Serum <15.0  10 - 30 (ug/mL)   SALICYLATE LEVEL     Status: Abnormal   Collection Time   07/01/11  3:44 PM      Component Value Range Comment    Salicylate Lvl <2.0 (*) 2.8 - 20.0 (mg/dL)   URINALYSIS, ROUTINE W REFLEX MICROSCOPIC     Status: Abnormal   Collection Time   07/01/11  4:07 PM      Component Value Range Comment   Color, Urine YELLOW  YELLOW     APPearance CLOUDY (*) CLEAR     Specific Gravity, Urine 1.011  1.005 - 1.030     pH 7.5  5.0 - 8.0     Glucose, UA NEGATIVE  NEGATIVE (mg/dL)    Hgb urine dipstick SMALL (*) NEGATIVE     Bilirubin Urine NEGATIVE  NEGATIVE     Ketones, ur NEGATIVE  NEGATIVE (mg/dL)    Protein, ur NEGATIVE  NEGATIVE (mg/dL)    Urobilinogen, UA 0.2  0.0 - 1.0 (mg/dL)    Nitrite NEGATIVE  NEGATIVE     Leukocytes, UA LARGE (*) NEGATIVE    URINE RAPID DRUG SCREEN (HOSP PERFORMED)     Status: Abnormal   Collection Time   07/01/11  4:07 PM      Component Value Range Comment   Opiates NONE DETECTED  NONE DETECTED     Cocaine NONE DETECTED  NONE DETECTED     Benzodiazepines POSITIVE (*) NONE DETECTED     Amphetamines NONE DETECTED  NONE DETECTED     Tetrahydrocannabinol NONE DETECTED  NONE DETECTED     Barbiturates NONE DETECTED  NONE DETECTED    URINE MICROSCOPIC-ADD ON     Status: Abnormal   Collection Time   07/01/11  4:07 PM      Component Value Range Comment   Squamous Epithelial / LPF FEW (*) RARE     WBC, UA TOO NUMEROUS TO COUNT  <3 (WBC/hpf)    RBC / HPF 3-6  <3 (RBC/hpf)   MRSA PCR SCREENING     Status: Normal   Collection Time   07/01/11 11:38 PM  Component Value Range Comment   MRSA by PCR NEGATIVE  NEGATIVE    BASIC METABOLIC PANEL     Status: Abnormal   Collection Time   07/02/11  3:38 AM      Component Value Range Comment   Sodium 137  135 - 145 (mEq/L)    Potassium 3.8  3.5 - 5.1 (mEq/L)    Chloride 99  96 - 112 (mEq/L)    CO2 29  19 - 32 (mEq/L)    Glucose, Bld 99  70 - 99 (mg/dL)    BUN 11  6 - 23 (mg/dL)    Creatinine, Ser 1.61  0.50 - 1.10 (mg/dL)    Calcium 8.9  8.4 - 10.5 (mg/dL)    GFR calc non Af Amer 89 (*) >90 (mL/min)    GFR calc Af Amer >90  >90 (mL/min)    CBC     Status: Normal   Collection Time   07/02/11  3:38 AM      Component Value Range Comment   WBC 9.2  4.0 - 10.5 (K/uL)    RBC 4.56  3.87 - 5.11 (MIL/uL)    Hemoglobin 13.1  12.0 - 15.0 (g/dL)    HCT 09.6  04.5 - 40.9 (%)    MCV 88.2  78.0 - 100.0 (fL)    MCH 28.7  26.0 - 34.0 (pg)    MCHC 32.6  30.0 - 36.0 (g/dL)    RDW 81.1  91.4 - 78.2 (%)    Platelets 249  150 - 400 (K/uL)   MAGNESIUM     Status: Normal   Collection Time   07/02/11  3:38 AM      Component Value Range Comment   Magnesium 1.7  1.5 - 2.5 (mg/dL)     No results found.  Positive for anxiety, bad mood, depression and Loss of grandchild and poor communication with the daughter Blood pressure 149/66, pulse 79, temperature 98.6 F (37 C), temperature source Oral, resp. rate 13, height 5\' 4"  (1.626 m), weight 195 lb 5.2 oz (88.6 kg), SpO2 95.00%.   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 Recommended individual/family and grief counseling as outpatient may discharge sitter as patient is contracting for safety. No medication changes were made during this visit. Thank you for psychiatric consultation  Kamarah Bilotta,JANARDHAHA R. 07/02/2011, 1:50 PM

## 2011-07-02 NOTE — Progress Notes (Signed)
Nursing Report called to Dwana Curd RN on [facility] [employer] RN CCRN

## 2011-07-02 NOTE — Progress Notes (Signed)
Subjective: Patient asleep but easily arousable. No complaints. Patient states she took 3 tablets each of her lyrica and lexapro, and not sure why she took them. Patient denies any SI or HI. Patient however states granddaughter diead 2 years ago and having a hard time getting over that. Patient also states lost 2 brothers and another brother who is sick and probably going to die soon sercondary to ETOH complications. Patient tearful.  Objective: Vital signs in last 24 hours: Filed Vitals:   07/02/11 0500 07/02/11 0600 07/02/11 0700 07/02/11 0800  BP: 136/71 149/83 131/77   Pulse: 76 71 74   Temp:    98.3 F (36.8 C)  TempSrc:    Oral  Resp: 16 16 11    Height:      Weight:      SpO2: 97% 95% 93% 93%    Intake/Output Summary (Last 24 hours) at 07/02/11 0847 Last data filed at 07/02/11 0700  Gross per 24 hour  Intake   1100 ml  Output    351 ml  Net    749 ml    Weight change:   General:  Slleping but easily arousable, Alert, awake, oriented x3, in no acute distress. HEENT: No bruits, no goiter. Heart: Regular rate and rhythm, without murmurs, rubs, gallops. Lungs: Clear to auscultation bilaterally. Abdomen: Soft, nontender, nondistended, positive bowel sounds. Extremities: No clubbing cyanosis or edema with positive pedal pulses. Neuro: Grossly intact, nonfocal.    Lab Results:  [facility] 07/02/11 0338 07/01/11 1544  NA 137 135  K 3.8 4.1  CL 99 97  CO2 29 29  GLUCOSE 99 99  BUN 11 10  CREATININE 0.75 0.75  CALCIUM 8.9 9.2  MG -- --  PHOS -- --    Basename 07/01/11 1544  AST 25  ALT 19  ALKPHOS 27*  BILITOT 0.3  PROT 7.0  ALBUMIN 3.4*   No results found for this basename: LIPASE:2,AMYLASE:2 in the last 72 hours  Basename 07/02/11 0338 07/01/11 1544  WBC 9.2 8.5  NEUTROABS -- 4.5  HGB 13.1 13.7  HCT 40.2 41.1  MCV 88.2 88.2  PLT 249 245   No results found for this basename: CKTOTAL:3,CKMB:3,CKMBINDEX:3,TROPONINI:3 in the last 72 hours No components  found with this basename: POCBNP:3 No results found for this basename: DDIMER:2 in the last 72 hours No results found for this basename: HGBA1C:2 in the last 72 hours No results found for this basename: CHOL:2,HDL:2,LDLCALC:2,TRIG:2,CHOLHDL:2,LDLDIRECT:2 in the last 72 hours No results found for this basename: TSH,T4TOTAL,FREET3,T3FREE,THYROIDAB in the last 72 hours No results found for this basename: VITAMINB12:2,FOLATE:2,FERRITIN:2,TIBC:2,IRON:2,RETICCTPCT:2 in the last 72 hours  Micro Results: Recent Results (from the past 240 hour(s))  MRSA PCR SCREENING     Status: Normal   Collection Time   07/01/11 11:38 PM      Component Value Range Status Comment   MRSA by PCR NEGATIVE  NEGATIVE  Final     Studies/Results: No results found.  Medications:     . cefTRIAXone (ROCEPHIN)  IV  1 g Intravenous Daily  . enoxaparin  40 mg Subcutaneous Q24H    Assessment: Principal Problem:  *Encephalopathy Active Problems:  Overdose  Altered mental status  Hypertension  Depression  UTI (lower urinary tract infection)  Hyperlipidemia  Anxiety   Plan: #1. Acute encephalopathy Secondary to overdose on lyrica and lexapro. Patient easily arousable and tearful. No seizures noted. No arrythmias noted. Continue supportive care. Follow.  #2 Overdose Patient not sure why she took 3 tablets each of  her lyrica and lexapro. Patient denies SI, however very tearful and finding it difficult to deal with death of granddaughter 2 years ago, and death of brothers and another brother whom she states is sick and probably going to die. Will consult with psychiatry for further evaluation and rxcs. Continue suicide precautions and sitter until evaluated by psychiatry.  #3. UTI Urine cultures pending. IV Rocephin.  #4.Depression/Anxiety Patient was on lexapro and risperdal but secondary to #1 and 2 will hold off until seen by psych. Ativan 1mg  PO TID prn. Psych consult pending.  #5. Hyperlipidemia Check  FLP. Resume home dose lipitor  #6. HTN BP stable. Resume home dose lasix.  #7 Prophylaxis PPI for GI, Lovenox for DVT.    LOS: 1 day   [facility] 07/02/2011, 8:47 AM

## 2011-07-02 NOTE — Progress Notes (Signed)
Pt. Requested medication for pain but refused it when she was told that tylenol  was all that was ordered for her at present. She has history of arthritic pain per earlier evaluation . Emotional support provided, pt continues to exhibit flat affect. Barkley Bruns RN CCRN

## 2011-07-03 ENCOUNTER — Encounter (HOSPITAL_COMMUNITY): Payer: Self-pay | Admitting: Internal Medicine

## 2011-07-03 DIAGNOSIS — E876 Hypokalemia: Secondary | ICD-10-CM | POA: Diagnosis not present

## 2011-07-03 DIAGNOSIS — F4321 Adjustment disorder with depressed mood: Secondary | ICD-10-CM

## 2011-07-03 DIAGNOSIS — T43294A Poisoning by other antidepressants, undetermined, initial encounter: Secondary | ICD-10-CM

## 2011-07-03 DIAGNOSIS — G9341 Metabolic encephalopathy: Secondary | ICD-10-CM

## 2011-07-03 DIAGNOSIS — F322 Major depressive disorder, single episode, severe without psychotic features: Secondary | ICD-10-CM

## 2011-07-03 DIAGNOSIS — F489 Nonpsychotic mental disorder, unspecified: Secondary | ICD-10-CM

## 2011-07-03 HISTORY — DX: Adjustment disorder with depressed mood: F43.21

## 2011-07-03 LAB — URINE CULTURE
Colony Count: >=100000
Culture  Setup Time: 201305250204

## 2011-07-03 LAB — BASIC METABOLIC PANEL
BUN: 10 mg/dL (ref 6–23)
CO2: 28 mEq/L (ref 19–32)
Calcium: 8.6 mg/dL (ref 8.4–10.5)
Chloride: 97 mEq/L (ref 96–112)
Creatinine, Ser: 0.77 mg/dL (ref 0.50–1.10)
GFR calc Af Amer: >90 mL/min (ref >90)
GFR calc non Af Amer: 88 mL/min — ABNORMAL LOW (ref >90)
Glucose, Bld: 145 mg/dL — ABNORMAL HIGH (ref 70–99)
Potassium: 3.3 mEq/L — ABNORMAL LOW (ref 3.5–5.1)
Sodium: 133 mEq/L — ABNORMAL LOW (ref 135–145)

## 2011-07-03 LAB — MAGNESIUM: Magnesium: 1.7 mg/dL (ref 1.5–2.5)

## 2011-07-03 MED ORDER — ALPRAZOLAM 1 MG PO TABS
2.0000 mg | ORAL_TABLET | Freq: Three times a day (TID) | ORAL | Status: DC | PRN
  Administered 2011-07-03: 2 mg via ORAL
  Filled 2011-07-03 (×2): qty 1

## 2011-07-03 MED ORDER — ESCITALOPRAM OXALATE 20 MG PO TABS
40.0000 mg | ORAL_TABLET | Freq: Every day | ORAL | Status: DC
  Administered 2011-07-03: 40 mg via ORAL
  Filled 2011-07-03: qty 2

## 2011-07-03 MED ORDER — POTASSIUM CHLORIDE CRYS ER 20 MEQ PO TBCR
40.0000 meq | EXTENDED_RELEASE_TABLET | ORAL | Status: DC
  Administered 2011-07-03: 40 meq via ORAL
  Filled 2011-07-03 (×2): qty 2

## 2011-07-03 MED ORDER — DEXTROSE 5 % IV SOLN
3.0000 g | Freq: Once | INTRAVENOUS | Status: AC
  Administered 2011-07-03: 3 g via INTRAVENOUS
  Filled 2011-07-03: qty 6

## 2011-07-03 MED ORDER — RISPERIDONE 2 MG PO TABS
2.0000 mg | ORAL_TABLET | Freq: Two times a day (BID) | ORAL | Status: DC
  Administered 2011-07-03: 2 mg via ORAL
  Filled 2011-07-03 (×2): qty 1

## 2011-07-03 NOTE — Progress Notes (Signed)
CSW was contacted by Dr. Janee Morn concerning  Resources for OPT and Grief counseling services in the community.   CSW provided a list of referrals for [facility] and OPT facilities in the local area.  No further assistance needed.  Pt to be d/c to home.   Leron Croak, LCSWA [employer] [phone]

## 2011-07-03 NOTE — Discharge Summary (Signed)
Discharge Summary  Alexandra Richardson MR#: [id number]  DOB:[birth year]  Date of Admission: 07/01/2011 Date of Discharge: 07/03/2011  Patient's PCP: Ailene Ravel, MD, MD  Attending Physician:Michaeljames Milnes  Consults: Treatment Team:  #1 psychiatry: Nehemiah Settle, MD   Discharge Diagnoses: Encephalopathy Present on Admission:  .Overdose .Altered mental status .Hypertension .Depression .UTI (lower urinary tract infection) .Encephalopathy .Hyperlipidemia .Anxiety .Grief   Brief Admitting History and Physical Alexandra Richardson is an 61 y.o. female who was brought to the ED by her sister for a possible overdose on her medications,Lyrica and Lexapro. It is not clear how many the patient has taken, and she is drowsy but reports only taking what she had been prescribed and no more than that. She reports having feeling of worthlessness and feeling ugly and Obese for a long time. She has had a depressed mood since the loss of her granddaughter 2 years ago. She denies suicidal ideation at this time . She also denies homicidal ideation at this time. She denies any prior attempt at suicide. She was evaluated in the ED and was somnolent. Poison Control was contacted and the medicationsiscussed and recommendations were made to monitor patient for cardioascular changes and possible seizure activity, and patient has been placed on suicide precautions until an evaluation by psychiatry once she is medically stable. For the rest of admission history and physical please see H&P dictated by Dr. Lovell Sheehan.  Discharge Medications Medication List  As of 07/03/2011 11:39 AM   CONTINUE taking these medications         albuterol 108 (90 BASE) MCG/ACT inhaler   Commonly known as: PROVENTIL HFA;VENTOLIN HFA      alprazolam 2 MG tablet   Commonly known as: XANAX      atorvastatin 40 MG tablet   Commonly known as: LIPITOR      escitalopram 20 MG tablet   Commonly known as: LEXAPRO     furosemide 40 MG tablet   Commonly known as: LASIX      omeprazole 20 MG capsule   Commonly known as: PRILOSEC      PERCOCET PO      risperiDONE 2 MG tablet   Commonly known as: [facility]           Hospital Course: #1: Encephalopathy/Overdose Patient was admitted with altered mental status and increased lethargy and drowsiness secondary to drug overdose. Patient was initially placed in the step down unit monitored for cardiovascular and neurological checks for seizures recommendations from poison control. Patient improved clinically became more alert and oriented and denied any suicidal or homicidal ideations. Patient did state however that has had several losses in the family including the granddaughter 2 years ago which have been difficult for her to get over. Patient was still grieving. Patient's diet was subsequently advanced which she tolerated patient did not have any arrhythmias noted on telemetry and patient did not have any seizures during this hospitalization. A psychiatric consultation was obtained and patient was seen in consultation by Dr. Elsie Saas on 07/02/2011. Per psychiatry was felt that patient had a major depressive disorder next grief reactions versus depressive disorder. It was recommended that patient receive outpatient psychiatric services and patient did not meet any acute psychiatric hospitalization. It was also recommended that patient undergo grief counseling. No changes in her medications were recommended. Patient improved clinically and was back to baseline by day of discharge. Social work consultation was obtained and it would provide patient with information concerning outpatient psychiatric services as well as grief counseling.  Patient will be discharged in stable and improved condition.  #2 probable urinary tract infection On admission urinalysis which was done was consistent with a urinary tract infection. Patient was placed empirically on IV Rocephin. Urine  cultures which were obtained did grow greater than 100,000 colonies of multiple bacterial morphotypes none predominant. Patient remained asymptomatic. Patient has received approximately one to 2 doses of IV Rocephin. No further antibiotics are needed at this time.  The rest of patient's chronic medical issues remained stable throughout the hospitalization. Patient will followup with PCP as outpatient one week post discharge.   Present on Admission:  .Overdose .Altered mental status .Hypertension .Depression .UTI (lower urinary tract infection) .Encephalopathy .Hyperlipidemia .Anxiety .Grief   Day of Discharge BP 156/83  Pulse 73  Temp(Src) 98.6 F (37 C) (Oral)  Resp 18  Ht 5\' 4"  (1.626 m)  Wt 88 kg (194 lb 0.1 oz)  BMI 33.30 kg/m2  SpO2 95% Subjective: No complaints. General: Alert, awake, oriented x3, in no acute distress. HEENT: No bruits, no goiter. Heart: Regular rate and rhythm, without murmurs, rubs, gallops. Lungs: Clear to auscultation bilaterally. Abdomen: Soft, nontender, nondistended, positive bowel sounds. Extremities: No clubbing cyanosis or edema with positive pedal pulses. Neuro: Grossly intact, nonfocal.   Results for orders placed during the hospital encounter of 07/01/11 (from the past 48 hour(s))  CBC     Status: Normal   Collection Time   07/01/11  3:44 PM      Component Value Range Comment   WBC 8.5  4.0 - 10.5 (K/uL)    RBC 4.66  3.87 - 5.11 (MIL/uL)    Hemoglobin 13.7  12.0 - 15.0 (g/dL)    HCT 16.1  09.6 - 04.5 (%)    MCV 88.2  78.0 - 100.0 (fL)    MCH 29.4  26.0 - 34.0 (pg)    MCHC 33.3  30.0 - 36.0 (g/dL)    RDW 40.9  81.1 - 91.4 (%)    Platelets 245  150 - 400 (K/uL)   DIFFERENTIAL     Status: Normal   Collection Time   07/01/11  3:44 PM      Component Value Range Comment   Neutrophils Relative 53  43 - 77 (%)    Neutro Abs 4.5  1.7 - 7.7 (K/uL)    Lymphocytes Relative 38  12 - 46 (%)    Lymphs Abs 3.3  0.7 - 4.0 (K/uL)    Monocytes  Relative 5  3 - 12 (%)    Monocytes Absolute 0.4  0.1 - 1.0 (K/uL)    Eosinophils Relative 3  0 - 5 (%)    Eosinophils Absolute 0.3  0.0 - 0.7 (K/uL)    Basophils Relative 1  0 - 1 (%)    Basophils Absolute 0.1  0.0 - 0.1 (K/uL)   COMPREHENSIVE METABOLIC PANEL     Status: Abnormal   Collection Time   07/01/11  3:44 PM      Component Value Range Comment   Sodium 135  135 - 145 (mEq/L)    Potassium 4.1  3.5 - 5.1 (mEq/L)    Chloride 97  96 - 112 (mEq/L)    CO2 29  19 - 32 (mEq/L)    Glucose, Bld 99  70 - 99 (mg/dL)    BUN 10  6 - 23 (mg/dL)    Creatinine, Ser 7.82  0.50 - 1.10 (mg/dL)    Calcium 9.2  8.4 - 10.5 (mg/dL)    Total Protein  7.0  6.0 - 8.3 (g/dL)    Albumin 3.4 (*) 3.5 - 5.2 (g/dL)    AST 25  0 - 37 (U/L)    ALT 19  0 - 35 (U/L)    Alkaline Phosphatase 27 (*) 39 - 117 (U/L)    Total Bilirubin 0.3  0.3 - 1.2 (mg/dL)    GFR calc non Af Amer 89 (*) >90 (mL/min)    GFR calc Af Amer >90  >90 (mL/min)   ETHANOL     Status: Normal   Collection Time   07/01/11  3:44 PM      Component Value Range Comment   Alcohol, Ethyl (B) <11  0 - 11 (mg/dL)   ACETAMINOPHEN LEVEL     Status: Normal   Collection Time   07/01/11  3:44 PM      Component Value Range Comment   Acetaminophen (Tylenol), Serum <15.0  10 - 30 (ug/mL)   SALICYLATE LEVEL     Status: Abnormal   Collection Time   07/01/11  3:44 PM      Component Value Range Comment   Salicylate Lvl <2.0 (*) 2.8 - 20.0 (mg/dL)   URINALYSIS, ROUTINE W REFLEX MICROSCOPIC     Status: Abnormal   Collection Time   07/01/11  4:07 PM      Component Value Range Comment   Color, Urine YELLOW  YELLOW     APPearance CLOUDY (*) CLEAR     Specific Gravity, Urine 1.011  1.005 - 1.030     pH 7.5  5.0 - 8.0     Glucose, UA NEGATIVE  NEGATIVE (mg/dL)    Hgb urine dipstick SMALL (*) NEGATIVE     Bilirubin Urine NEGATIVE  NEGATIVE     Ketones, ur NEGATIVE  NEGATIVE (mg/dL)    Protein, ur NEGATIVE  NEGATIVE (mg/dL)    Urobilinogen, UA 0.2  0.0 -  1.0 (mg/dL)    Nitrite NEGATIVE  NEGATIVE     Leukocytes, UA LARGE (*) NEGATIVE    URINE RAPID DRUG SCREEN (HOSP PERFORMED)     Status: Abnormal   Collection Time   07/01/11  4:07 PM      Component Value Range Comment   Opiates NONE DETECTED  NONE DETECTED     Cocaine NONE DETECTED  NONE DETECTED     Benzodiazepines POSITIVE (*) NONE DETECTED     Amphetamines NONE DETECTED  NONE DETECTED     Tetrahydrocannabinol NONE DETECTED  NONE DETECTED     Barbiturates NONE DETECTED  NONE DETECTED    URINE MICROSCOPIC-ADD ON     Status: Abnormal   Collection Time   07/01/11  4:07 PM      Component Value Range Comment   Squamous Epithelial / LPF FEW (*) RARE     WBC, UA TOO NUMEROUS TO COUNT  <3 (WBC/hpf)    RBC / HPF 3-6  <3 (RBC/hpf)   URINE CULTURE     Status: Normal   Collection Time   07/01/11  6:04 PM      Component Value Range Comment   Specimen Description URINE, CLEAN CATCH      Special Requests NONE      Culture  Setup Time [id number]      Colony Count >=100,000 COLONIES/ML      Culture        Value: Multiple bacterial morphotypes present, none predominant. Suggest appropriate recollection if clinically indicated.   Report Status 07/03/2011 FINAL     MRSA PCR SCREENING  Status: Normal   Collection Time   07/01/11 11:38 PM      Component Value Range Comment   MRSA by PCR NEGATIVE  NEGATIVE    BASIC METABOLIC PANEL     Status: Abnormal   Collection Time   07/02/11  3:38 AM      Component Value Range Comment   Sodium 137  135 - 145 (mEq/L)    Potassium 3.8  3.5 - 5.1 (mEq/L)    Chloride 99  96 - 112 (mEq/L)    CO2 29  19 - 32 (mEq/L)    Glucose, Bld 99  70 - 99 (mg/dL)    BUN 11  6 - 23 (mg/dL)    Creatinine, Ser 4.09  0.50 - 1.10 (mg/dL)    Calcium 8.9  8.4 - 10.5 (mg/dL)    GFR calc non Af Amer 89 (*) >90 (mL/min)    GFR calc Af Amer >90  >90 (mL/min)   CBC     Status: Normal   Collection Time   07/02/11  3:38 AM      Component Value Range Comment   WBC 9.2  4.0 -  10.5 (K/uL)    RBC 4.56  3.87 - 5.11 (MIL/uL)    Hemoglobin 13.1  12.0 - 15.0 (g/dL)    HCT 81.1  91.4 - 78.2 (%)    MCV 88.2  78.0 - 100.0 (fL)    MCH 28.7  26.0 - 34.0 (pg)    MCHC 32.6  30.0 - 36.0 (g/dL)    RDW 95.6  21.3 - 08.6 (%)    Platelets 249  150 - 400 (K/uL)   MAGNESIUM     Status: Normal   Collection Time   07/02/11  3:38 AM      Component Value Range Comment   Magnesium 1.7  1.5 - 2.5 (mg/dL)   MAGNESIUM     Status: Normal   Collection Time   07/03/11  6:10 AM      Component Value Range Comment   Magnesium 1.7  1.5 - 2.5 (mg/dL)   BASIC METABOLIC PANEL     Status: Abnormal   Collection Time   07/03/11  6:10 AM      Component Value Range Comment   Sodium 133 (*) 135 - 145 (mEq/L)    Potassium 3.3 (*) 3.5 - 5.1 (mEq/L)    Chloride 97  96 - 112 (mEq/L)    CO2 28  19 - 32 (mEq/L)    Glucose, Bld 145 (*) 70 - 99 (mg/dL)    BUN 10  6 - 23 (mg/dL)    Creatinine, Ser 5.78  0.50 - 1.10 (mg/dL)    Calcium 8.6  8.4 - 10.5 (mg/dL)    GFR calc non Af Amer 88 (*) >90 (mL/min)    GFR calc Af Amer >90  >90 (mL/min)     No results found.   Disposition: Home  Diet: Regular diet  Activity: Increase activity slowly   Follow-up Appts: Discharge Orders    Future Orders Please Complete By Expires   Diet general      Increase activity slowly      Discharge instructions      Comments:   Follow up with [facility], MD, in 1 week Follow up with psychiatry as outpatient Follow up outpatient grief counseling.       TESTS THAT NEED FOLLOW-UP Patient will need a BMET a followup with PCP to monitor electrolytes and renal function  Time spent on  discharge, talking to the patient, and coordinating care: 45 mins.   Signed: Omare Bilotta [phone] 07/03/2011, 11:39 AM

## 2011-10-14 ENCOUNTER — Other Ambulatory Visit: Payer: Self-pay | Admitting: Family Medicine

## 2011-10-14 DIAGNOSIS — Z1231 Encounter for screening mammogram for malignant neoplasm of breast: Secondary | ICD-10-CM

## 2011-10-24 ENCOUNTER — Ambulatory Visit: Payer: Medicare Other

## 2011-12-01 ENCOUNTER — Other Ambulatory Visit: Payer: Medicare Other

## 2011-12-01 ENCOUNTER — Ambulatory Visit: Payer: Medicare Other

## 2012-03-05 ENCOUNTER — Other Ambulatory Visit: Payer: Self-pay | Admitting: Family Medicine

## 2012-03-05 DIAGNOSIS — Z1231 Encounter for screening mammogram for malignant neoplasm of breast: Secondary | ICD-10-CM

## 2012-03-05 DIAGNOSIS — E2839 Other primary ovarian failure: Secondary | ICD-10-CM

## 2012-04-03 ENCOUNTER — Ambulatory Visit
Admission: RE | Admit: 2012-04-03 | Discharge: 2012-04-03 | Disposition: A | Discharge status: Home / Self Care | Payer: PRIVATE HEALTH INSURANCE | Source: Ambulatory Visit | Attending: Family Medicine | Admitting: Family Medicine

## 2012-04-03 DIAGNOSIS — Z1231 Encounter for screening mammogram for malignant neoplasm of breast: Secondary | ICD-10-CM

## 2012-04-03 DIAGNOSIS — E2839 Other primary ovarian failure: Secondary | ICD-10-CM

## 2013-02-07 ENCOUNTER — Emergency Department (HOSPITAL_COMMUNITY): Payer: Medicare Other

## 2013-02-07 ENCOUNTER — Inpatient Hospital Stay (HOSPITAL_COMMUNITY)
Admission: EM | Admit: 2013-02-07 | Discharge: 2013-02-12 | DRG: 871 | Disposition: A | Discharge status: Home Health | Payer: Medicare Other | Attending: Pulmonary Disease | Admitting: Pulmonary Disease

## 2013-02-07 ENCOUNTER — Encounter (HOSPITAL_COMMUNITY): Payer: Self-pay | Admitting: Emergency Medicine

## 2013-02-07 DIAGNOSIS — I9589 Other hypotension: Secondary | ICD-10-CM | POA: Diagnosis not present

## 2013-02-07 DIAGNOSIS — E785 Hyperlipidemia, unspecified: Secondary | ICD-10-CM

## 2013-02-07 DIAGNOSIS — F4321 Adjustment disorder with depressed mood: Secondary | ICD-10-CM

## 2013-02-07 DIAGNOSIS — F32A Depression, unspecified: Secondary | ICD-10-CM

## 2013-02-07 DIAGNOSIS — J4489 Other specified chronic obstructive pulmonary disease: Secondary | ICD-10-CM | POA: Diagnosis present

## 2013-02-07 DIAGNOSIS — F17200 Nicotine dependence, unspecified, uncomplicated: Secondary | ICD-10-CM | POA: Diagnosis present

## 2013-02-07 DIAGNOSIS — E876 Hypokalemia: Secondary | ICD-10-CM | POA: Diagnosis not present

## 2013-02-07 DIAGNOSIS — F419 Anxiety disorder, unspecified: Secondary | ICD-10-CM

## 2013-02-07 DIAGNOSIS — J9600 Acute respiratory failure, unspecified whether with hypoxia or hypercapnia: Secondary | ICD-10-CM

## 2013-02-07 DIAGNOSIS — B952 Enterococcus as the cause of diseases classified elsewhere: Secondary | ICD-10-CM | POA: Diagnosis present

## 2013-02-07 DIAGNOSIS — J13 Pneumonia due to Streptococcus pneumoniae: Secondary | ICD-10-CM | POA: Diagnosis present

## 2013-02-07 DIAGNOSIS — Z833 Family history of diabetes mellitus: Secondary | ICD-10-CM

## 2013-02-07 DIAGNOSIS — J189 Pneumonia, unspecified organism: Secondary | ICD-10-CM | POA: Diagnosis present

## 2013-02-07 DIAGNOSIS — I10 Essential (primary) hypertension: Secondary | ICD-10-CM

## 2013-02-07 DIAGNOSIS — F3289 Other specified depressive episodes: Secondary | ICD-10-CM | POA: Diagnosis present

## 2013-02-07 DIAGNOSIS — F411 Generalized anxiety disorder: Secondary | ICD-10-CM | POA: Diagnosis present

## 2013-02-07 DIAGNOSIS — G9340 Encephalopathy, unspecified: Secondary | ICD-10-CM

## 2013-02-07 DIAGNOSIS — Z79899 Other long term (current) drug therapy: Secondary | ICD-10-CM

## 2013-02-07 DIAGNOSIS — N390 Urinary tract infection, site not specified: Secondary | ICD-10-CM | POA: Diagnosis present

## 2013-02-07 DIAGNOSIS — R4182 Altered mental status, unspecified: Secondary | ICD-10-CM

## 2013-02-07 DIAGNOSIS — Z889 Allergy status to unspecified drugs, medicaments and biological substances status: Secondary | ICD-10-CM

## 2013-02-07 DIAGNOSIS — F329 Major depressive disorder, single episode, unspecified: Secondary | ICD-10-CM | POA: Diagnosis present

## 2013-02-07 DIAGNOSIS — G8929 Other chronic pain: Secondary | ICD-10-CM | POA: Diagnosis present

## 2013-02-07 DIAGNOSIS — M129 Arthropathy, unspecified: Secondary | ICD-10-CM | POA: Diagnosis present

## 2013-02-07 DIAGNOSIS — Z8249 Family history of ischemic heart disease and other diseases of the circulatory system: Secondary | ICD-10-CM

## 2013-02-07 DIAGNOSIS — J449 Chronic obstructive pulmonary disease, unspecified: Secondary | ICD-10-CM

## 2013-02-07 DIAGNOSIS — R7309 Other abnormal glucose: Secondary | ICD-10-CM | POA: Diagnosis present

## 2013-02-07 DIAGNOSIS — A419 Sepsis, unspecified organism: Principal | ICD-10-CM | POA: Diagnosis present

## 2013-02-07 LAB — COMPREHENSIVE METABOLIC PANEL
ALT: 24 U/L (ref 0–35)
AST: 43 U/L — ABNORMAL HIGH (ref 0–37)
Albumin: 2.2 g/dL — ABNORMAL LOW (ref 3.5–5.2)
Alkaline Phosphatase: 25 U/L — ABNORMAL LOW (ref 39–117)
BUN: 24 mg/dL — ABNORMAL HIGH (ref 6–23)
CO2: 23 mEq/L (ref 19–32)
Calcium: 8.9 mg/dL (ref 8.4–10.5)
Chloride: 94 mEq/L — ABNORMAL LOW (ref 96–112)
Creatinine, Ser: 0.7 mg/dL (ref 0.50–1.10)
GFR calc Af Amer: >90 mL/min (ref >90)
GFR calc non Af Amer: 90 mL/min — ABNORMAL LOW (ref >90)
Glucose, Bld: 213 mg/dL — ABNORMAL HIGH (ref 70–99)
Potassium: 3.9 mEq/L (ref 3.7–5.3)
Sodium: 134 mEq/L — ABNORMAL LOW (ref 137–147)
Total Bilirubin: 1.4 mg/dL — ABNORMAL HIGH (ref 0.3–1.2)
Total Protein: 6.7 g/dL (ref 6.0–8.3)

## 2013-02-07 LAB — URINALYSIS, ROUTINE W REFLEX MICROSCOPIC
Glucose, UA: NEGATIVE mg/dL
Ketones, ur: 15 mg/dL — AB
Nitrite: NEGATIVE
Protein, ur: 100 mg/dL — AB
Specific Gravity, Urine: 1.023 (ref 1.005–1.030)
Urobilinogen, UA: 2 mg/dL — ABNORMAL HIGH (ref 0.0–1.0)
pH: 5.5 (ref 5.0–8.0)

## 2013-02-07 LAB — CBC WITH DIFFERENTIAL/PLATELET
Basophils Absolute: 0 10*3/uL (ref 0.0–0.1)
Basophils Relative: 0 % (ref 0–1)
Eosinophils Absolute: 0 10*3/uL (ref 0.0–0.7)
Eosinophils Relative: 0 % (ref 0–5)
HCT: 33.2 % — ABNORMAL LOW (ref 36.0–46.0)
Hemoglobin: 11.4 g/dL — ABNORMAL LOW (ref 12.0–15.0)
Lymphocytes Relative: 6 % — ABNORMAL LOW (ref 12–46)
Lymphs Abs: 1.6 10*3/uL (ref 0.7–4.0)
MCH: 29.9 pg (ref 26.0–34.0)
MCHC: 34.3 g/dL (ref 30.0–36.0)
MCV: 87.1 fL (ref 78.0–100.0)
Monocytes Absolute: 1.3 10*3/uL — ABNORMAL HIGH (ref 0.1–1.0)
Monocytes Relative: 5 % (ref 3–12)
Neutro Abs: 23.2 10*3/uL — ABNORMAL HIGH (ref 1.7–7.7)
Neutrophils Relative %: 89 % — ABNORMAL HIGH (ref 43–77)
Platelets: 218 10*3/uL (ref 150–400)
RBC: 3.81 MIL/uL — ABNORMAL LOW (ref 3.87–5.11)
RDW: 14.5 % (ref 11.5–15.5)
WBC: 26.1 10*3/uL — ABNORMAL HIGH (ref 4.0–10.5)

## 2013-02-07 LAB — POCT I-STAT 3, ART BLOOD GAS (G3+)
Bicarbonate: 26.4 mEq/L — ABNORMAL HIGH (ref 20.0–24.0)
O2 Saturation: 100 %
Patient temperature: 38
TCO2: 28 mmol/L (ref 0–100)
pCO2 arterial: 50.5 mmHg — ABNORMAL HIGH (ref 35.0–45.0)
pH, Arterial: 7.331 — ABNORMAL LOW (ref 7.350–7.450)
pO2, Arterial: 187 mmHg — ABNORMAL HIGH (ref 80.0–100.0)

## 2013-02-07 LAB — PROTIME-INR
INR: 1.48 (ref 0.00–1.49)
Prothrombin Time: 17.5 seconds — ABNORMAL HIGH (ref 11.6–15.2)

## 2013-02-07 LAB — CBC
HCT: 32 % — ABNORMAL LOW (ref 36.0–46.0)
Hemoglobin: 11 g/dL — ABNORMAL LOW (ref 12.0–15.0)
MCH: 30.2 pg (ref 26.0–34.0)
MCHC: 34.4 g/dL (ref 30.0–36.0)
MCV: 87.9 fL (ref 78.0–100.0)
Platelets: 182 10*3/uL (ref 150–400)
RBC: 3.64 MIL/uL — ABNORMAL LOW (ref 3.87–5.11)
RDW: 14.9 % (ref 11.5–15.5)
WBC: 23.3 10*3/uL — ABNORMAL HIGH (ref 4.0–10.5)

## 2013-02-07 LAB — LACTIC ACID, PLASMA
Lactic Acid, Venous: 3.7 mmol/L — ABNORMAL HIGH (ref 0.5–2.2)
Lactic Acid, Venous: 1.8 mmol/L (ref 0.5–2.2)

## 2013-02-07 LAB — GLUCOSE, CAPILLARY
Glucose-Capillary: 224 mg/dL — ABNORMAL HIGH (ref 70–99)
Glucose-Capillary: 191 mg/dL — ABNORMAL HIGH (ref 70–99)
Glucose-Capillary: 185 mg/dL — ABNORMAL HIGH (ref 70–99)

## 2013-02-07 LAB — PROCALCITONIN: Procalcitonin: 12.7 ng/mL

## 2013-02-07 LAB — URINE MICROSCOPIC-ADD ON

## 2013-02-07 LAB — CREATININE, SERUM
Creatinine, Ser: 0.75 mg/dL (ref 0.50–1.10)
GFR calc Af Amer: >90 mL/min (ref >90)
GFR calc non Af Amer: 88 mL/min — ABNORMAL LOW (ref >90)

## 2013-02-07 LAB — APTT: aPTT: 39 seconds — ABNORMAL HIGH (ref 24–37)

## 2013-02-07 LAB — STREP PNEUMONIAE URINARY ANTIGEN: Strep Pneumo Urinary Antigen: POSITIVE — AB

## 2013-02-07 LAB — TRIGLYCERIDES: Triglycerides: 259 mg/dL — ABNORMAL HIGH (ref <150)

## 2013-02-07 MED ORDER — ALBUTEROL SULFATE (2.5 MG/3ML) 0.083% IN NEBU
2.5000 mg | INHALATION_SOLUTION | Freq: Four times a day (QID) | RESPIRATORY_TRACT | Status: DC
  Administered 2013-02-08 – 2013-02-10 (×9): 2.5 mg via RESPIRATORY_TRACT
  Filled 2013-02-07 (×20): qty 3

## 2013-02-07 MED ORDER — LIDOCAINE HCL (CARDIAC) 20 MG/ML IV SOLN
INTRAVENOUS | Status: AC
  Filled 2013-02-07: qty 5

## 2013-02-07 MED ORDER — PROPOFOL 10 MG/ML IV EMUL
INTRAVENOUS | Status: AC
  Filled 2013-02-07: qty 100

## 2013-02-07 MED ORDER — PROPOFOL 10 MG/ML IV BOLUS
INTRAVENOUS | Status: AC
  Administered 2013-02-07: 50 mg via INTRAVENOUS
  Filled 2013-02-07: qty 20

## 2013-02-07 MED ORDER — ALBUTEROL SULFATE (2.5 MG/3ML) 0.083% IN NEBU: 2.5000 mg | INHALATION_SOLUTION | RESPIRATORY_TRACT | Status: DC | PRN

## 2013-02-07 MED ORDER — MIDAZOLAM HCL 2 MG/2ML IJ SOLN
2.0000 mg | INTRAMUSCULAR | Status: DC | PRN
  Administered 2013-02-08 – 2013-02-09 (×2): 2 mg via INTRAVENOUS
  Filled 2013-02-07 (×2): qty 2

## 2013-02-07 MED ORDER — SODIUM CHLORIDE 0.9 % IV BOLUS (SEPSIS)
1000.0000 mL | Freq: Once | INTRAVENOUS | Status: AC
  Administered 2013-02-07: 1000 mL via INTRAVENOUS

## 2013-02-07 MED ORDER — CHLORHEXIDINE GLUCONATE 0.12 % MT SOLN
15.0000 mL | Freq: Two times a day (BID) | OROMUCOSAL | Status: DC
  Administered 2013-02-08 – 2013-02-09 (×4): 15 mL via OROMUCOSAL
  Filled 2013-02-07 (×4): qty 15

## 2013-02-07 MED ORDER — NOREPINEPHRINE BITARTRATE 1 MG/ML IJ SOLN
2.0000 ug/min | INTRAVENOUS | Status: DC
  Administered 2013-02-07: 4 ug/min via INTRAVENOUS
  Filled 2013-02-07: qty 4

## 2013-02-07 MED ORDER — VANCOMYCIN HCL 10 G IV SOLR
1250.0000 mg | Freq: Once | INTRAVENOUS | Status: AC
  Administered 2013-02-07: 1250 mg via INTRAVENOUS
  Filled 2013-02-07: qty 1250

## 2013-02-07 MED ORDER — ACETAMINOPHEN 650 MG RE SUPP
650.0000 mg | Freq: Once | RECTAL | Status: AC
  Administered 2013-02-07: 650 mg via RECTAL
  Filled 2013-02-07: qty 1

## 2013-02-07 MED ORDER — PIPERACILLIN-TAZOBACTAM 3.375 G IVPB: 3.3750 g | Freq: Three times a day (TID) | INTRAVENOUS | Status: DC

## 2013-02-07 MED ORDER — SODIUM CHLORIDE 0.9 % IV SOLN
250.0000 mL | INTRAVENOUS | Status: DC | PRN
  Administered 2013-02-08: 250 mL via INTRAVENOUS

## 2013-02-07 MED ORDER — PROPOFOL 10 MG/ML IV EMUL
5.0000 ug/kg/min | Freq: Once | INTRAVENOUS | Status: AC
  Administered 2013-02-07: 50 mg via INTRAVENOUS

## 2013-02-07 MED ORDER — HEPARIN SODIUM (PORCINE) 5000 UNIT/ML IJ SOLN
5000.0000 [IU] | Freq: Three times a day (TID) | INTRAMUSCULAR | Status: DC
  Administered 2013-02-07 – 2013-02-12 (×15): 5000 [IU] via SUBCUTANEOUS
  Filled 2013-02-07 (×17): qty 1

## 2013-02-07 MED ORDER — OSELTAMIVIR PHOSPHATE 6 MG/ML PO SUSR
75.0000 mg | Freq: Two times a day (BID) | ORAL | Status: DC
  Administered 2013-02-07 – 2013-02-08 (×2): 75 mg
  Filled 2013-02-07 (×3): qty 12.5

## 2013-02-07 MED ORDER — PROPOFOL 10 MG/ML IV EMUL: 0.0000 ug/kg/min | INTRAVENOUS | Status: DC

## 2013-02-07 MED ORDER — INSULIN ASPART 100 UNIT/ML ~~LOC~~ SOLN
0.0000 [IU] | SUBCUTANEOUS | Status: DC
  Administered 2013-02-07: 3 [IU] via SUBCUTANEOUS
  Administered 2013-02-08 (×2): 2 [IU] via SUBCUTANEOUS
  Administered 2013-02-08 (×2): 3 [IU] via SUBCUTANEOUS
  Administered 2013-02-08: 2 [IU] via SUBCUTANEOUS
  Administered 2013-02-08: 3 [IU] via SUBCUTANEOUS
  Administered 2013-02-08: 2 [IU] via SUBCUTANEOUS
  Administered 2013-02-09 (×5): 3 [IU] via SUBCUTANEOUS
  Administered 2013-02-10 (×2): 2 [IU] via SUBCUTANEOUS

## 2013-02-07 MED ORDER — PANTOPRAZOLE SODIUM 40 MG IV SOLR
40.0000 mg | Freq: Every day | INTRAVENOUS | Status: DC
  Administered 2013-02-07: 40 mg via INTRAVENOUS
  Filled 2013-02-07 (×2): qty 40

## 2013-02-07 MED ORDER — SODIUM CHLORIDE 0.9 % IV BOLUS (SEPSIS): 1000.0000 mL | Freq: Once | INTRAVENOUS | Status: DC

## 2013-02-07 MED ORDER — SUCCINYLCHOLINE CHLORIDE 20 MG/ML IJ SOLN
INTRAMUSCULAR | Status: AC
  Filled 2013-02-07: qty 1

## 2013-02-07 MED ORDER — PROPOFOL 10 MG/ML IV EMUL
5.0000 ug/kg/min | Freq: Once | INTRAVENOUS | Status: AC
  Administered 2013-02-07: 5 ug/kg/min via INTRAVENOUS

## 2013-02-07 MED ORDER — LEVOFLOXACIN IN D5W 750 MG/150ML IV SOLN
750.0000 mg | INTRAVENOUS | Status: DC
  Administered 2013-02-07 – 2013-02-08 (×2): 750 mg via INTRAVENOUS
  Filled 2013-02-07 (×3): qty 150

## 2013-02-07 MED ORDER — VANCOMYCIN HCL IN DEXTROSE 750-5 MG/150ML-% IV SOLN
750.0000 mg | Freq: Three times a day (TID) | INTRAVENOUS | Status: DC
  Administered 2013-02-08 – 2013-02-10 (×7): 750 mg via INTRAVENOUS
  Filled 2013-02-07 (×9): qty 150

## 2013-02-07 MED ORDER — ROCURONIUM BROMIDE 50 MG/5ML IV SOLN
INTRAVENOUS | Status: AC
  Administered 2013-02-07: 100 mg
  Filled 2013-02-07: qty 2

## 2013-02-07 MED ORDER — ETOMIDATE 2 MG/ML IV SOLN
INTRAVENOUS | Status: AC
  Filled 2013-02-07: qty 20

## 2013-02-07 MED ORDER — PIPERACILLIN-TAZOBACTAM 3.375 G IVPB 30 MIN
3.3750 g | Freq: Once | INTRAVENOUS | Status: AC
  Administered 2013-02-07: 3.375 g via INTRAVENOUS
  Filled 2013-02-07: qty 50

## 2013-02-07 MED ORDER — DEXTROSE 5 % IV SOLN
1.0000 g | INTRAVENOUS | Status: DC
  Administered 2013-02-07 – 2013-02-11 (×5): 1 g via INTRAVENOUS
  Filled 2013-02-07 (×6): qty 10

## 2013-02-07 MED ORDER — FENTANYL CITRATE 0.05 MG/ML IJ SOLN
100.0000 ug | INTRAMUSCULAR | Status: DC | PRN
  Administered 2013-02-08 (×2): 100 ug via INTRAVENOUS
  Filled 2013-02-07 (×2): qty 2

## 2013-02-07 MED ORDER — SODIUM CHLORIDE 0.9 % IV SOLN
INTRAVENOUS | Status: DC
  Administered 2013-02-07: 21:00:00 via INTRAVENOUS

## 2013-02-07 MED ORDER — IPRATROPIUM BROMIDE 0.02 % IN SOLN
0.5000 mg | Freq: Four times a day (QID) | RESPIRATORY_TRACT | Status: DC
  Administered 2013-02-08 – 2013-02-10 (×9): 0.5 mg via RESPIRATORY_TRACT
  Filled 2013-02-07 (×8): qty 2.5

## 2013-02-07 MED ORDER — BIOTENE DRY MOUTH MT LIQD
15.0000 mL | Freq: Two times a day (BID) | OROMUCOSAL | Status: DC
  Administered 2013-02-08 – 2013-02-09 (×4): 15 mL via OROMUCOSAL

## 2013-02-07 MED ORDER — VANCOMYCIN HCL IN DEXTROSE 1-5 GM/200ML-% IV SOLN: 1000.0000 mg | Freq: Two times a day (BID) | INTRAVENOUS | Status: DC

## 2013-02-07 MED ORDER — MIDAZOLAM HCL 2 MG/2ML IJ SOLN: 2.0000 mg | INTRAMUSCULAR | Status: DC | PRN

## 2013-02-07 NOTE — ED Notes (Signed)
MD at bedside. 

## 2013-02-07 NOTE — ED Notes (Signed)
Family called ems today d/t lethargy.  Pt had not been seen x 1 week.  When EMS pt had kussmal rr and per ems smelled like ketones and was not responding appropriately.  Cbg 244.  Original sats 80 on RA that increased to 94 on nrb.  EMS gave 500 ns bolus.  Pt alert to name and knows she is in hospital.  Rectal temp of 103.

## 2013-02-07 NOTE — ED Notes (Signed)
Levophed started at 30mcg.

## 2013-02-07 NOTE — ED Notes (Signed)
BP 111/81. Leophen decreased to 20 mcg

## 2013-02-07 NOTE — Significant Event (Signed)
Pt hypotensive after intubation in spite of fluid bolus.  Started on levophed, and diprivan d/c'ed.  Blood pressure improved, and now weaning levophed.  Will give additional fluid bolus >> defer CVL placement unless unable to wean off pressors.  Coralyn HellingVineet Raushanah Osmundson, MD [facility] 02/07/2013, 9:28 PM Pager:  [phone] After 3pm call: [phone]

## 2013-02-07 NOTE — ED Notes (Addendum)
Family at bedside. 

## 2013-02-07 NOTE — ED Provider Notes (Signed)
CSN: [id number]     Arrival date & time 02/07/13  1752 History   First MD Initiated Contact with Patient 02/07/13 1754     Chief Complaint  Patient presents with  . Altered Mental Status  . Shortness of Breath   HPI  63 y/o female with history as noted below who presents via EMS for altered mental status. Per EMS, the patient hadn't been seen by family member for over a week. EMS states the patient's Ex-husband called them when she was found lethargic. The patient states she has been "sick" for the last week. She states she has had nausea and diarrhea. She had initial O2 sats of 80 on RA and they subsequently increased to normal after being placed on a non-re breather. Additional history and ROS is limited secondary to the condition of the patient.   Past Medical History  Diagnosis Date  . Hypertension   . Hyperlipidemia   . Depression   . Anxiety   . Arthritis   . Grief 07/03/2011   Past Surgical History  Procedure Laterality Date  . Cholecystectomy     Family History  Problem Relation Age of Onset  . Coronary artery disease Father   . Diabetes type II Sister   . Hypertension Mother    History  Substance Use Topics  . Smoking status: Current Every Day Smoker -- 1.00 packs/day  . Smokeless tobacco: Not on file  . Alcohol Use: Yes     Comment: occasionally   OB History   Grav Para Term Preterm Abortions TAB SAB Ect Mult Living                 Review of Systems  Unable to perform ROS: Acuity of condition   Allergies  Codeine  Home Medications   No current outpatient prescriptions on file. BP 108/67  Pulse 94  Temp(Src) 97.5 F (36.4 C) (Axillary)  Resp 22  Ht 5' 4.17" (1.63 m)  Wt 194 lb 0.1 oz (88 kg)  BMI 33.12 kg/m2  SpO2 96% Physical Exam  Nursing note and vitals reviewed. Constitutional: She is oriented to person, place, and time. She appears ill.  HENT:  Head: Normocephalic and atraumatic.  Mouth/Throat: Mucous membranes are dry. No oropharyngeal  exudate.  Eyes: Conjunctivae are normal. Pupils are equal, round, and reactive to light.  Neck: Normal range of motion. Neck supple.  Cardiovascular: Tachycardia present.  Exam reveals no gallop and no friction rub.   No murmur heard. Pulmonary/Chest: Breath sounds normal. Tachypnea noted.  Abdominal: Soft. She exhibits no distension. There is no tenderness.  Musculoskeletal: Normal range of motion. She exhibits no edema and no tenderness.  Neurological: She is alert and oriented to person, place, and time. She has normal strength. No cranial nerve deficit or sensory deficit.  Skin: Skin is warm and dry.  Psychiatric: She has a normal mood and affect.   ED Course  INTUBATION Date/Time: 02/07/2013 8:38 PM Performed by: Shanon Ace Authorized by: Shanon Ace Consent: The procedure was performed in an emergent situation. Risks and benefits: risks, benefits and alternatives were discussed Consent given by: patient Patient identity confirmed: arm band Time out: Immediately prior to procedure a "time out" was called to verify the correct patient, procedure, equipment, support staff and site/side marked as required. Indications: respiratory distress and respiratory failure Intubation method: video-assisted Patient status: paralyzed (RSI) Sedatives: propofol Paralytic: rocuronium Laryngoscope size: glidescope size 3 blade. Tube size: 7.5 mm Tube type: cuffed Number of attempts: 1 Post-procedure  assessment: chest rise Breath sounds: equal Cuff inflated: yes ETT to lip: 22 cm ETT to teeth: 21 cm Tube secured with: ETT holder Chest x-ray interpreted by me. Chest x-ray findings: endotracheal tube in appropriate position Patient tolerance: Patient tolerated the procedure well with no immediate complications.   (including critical care time) Labs Review Labs Reviewed  GLUCOSE, CAPILLARY - Abnormal; Notable for the following:    Glucose-Capillary 224 (*)    All other  components within normal limits  URINALYSIS, ROUTINE W REFLEX MICROSCOPIC - Abnormal; Notable for the following:    Color, Urine AMBER (*)    APPearance CLOUDY (*)    Hgb urine dipstick MODERATE (*)    Bilirubin Urine SMALL (*)    Ketones, ur 15 (*)    Protein, ur 100 (*)    Urobilinogen, UA 2.0 (*)    Leukocytes, UA SMALL (*)    All other components within normal limits  LACTIC ACID, PLASMA - Abnormal; Notable for the following:    Lactic Acid, Venous 3.7 (*)    All other components within normal limits  CBC WITH DIFFERENTIAL - Abnormal; Notable for the following:    WBC 26.1 (*)    RBC 3.81 (*)    Hemoglobin 11.4 (*)    HCT 33.2 (*)    Neutrophils Relative % 89 (*)    Lymphocytes Relative 6 (*)    Neutro Abs 23.2 (*)    Monocytes Absolute 1.3 (*)    All other components within normal limits  COMPREHENSIVE METABOLIC PANEL - Abnormal; Notable for the following:    Sodium 134 (*)    Chloride 94 (*)    Glucose, Bld 213 (*)    BUN 24 (*)    Albumin 2.2 (*)    AST 43 (*)    Alkaline Phosphatase 25 (*)    Total Bilirubin 1.4 (*)    GFR calc non Af Amer 90 (*)    All other components within normal limits  URINE MICROSCOPIC-ADD ON - Abnormal; Notable for the following:    Bacteria, UA FEW (*)    All other components within normal limits  GLUCOSE, CAPILLARY - Abnormal; Notable for the following:    Glucose-Capillary 191 (*)    All other components within normal limits  CBC - Abnormal; Notable for the following:    WBC 23.3 (*)    RBC 3.64 (*)    Hemoglobin 11.0 (*)    HCT 32.0 (*)    All other components within normal limits  CREATININE, SERUM - Abnormal; Notable for the following:    GFR calc non Af Amer 88 (*)    All other components within normal limits  TRIGLYCERIDES - Abnormal; Notable for the following:    Triglycerides 259 (*)    All other components within normal limits  STREP PNEUMONIAE URINARY ANTIGEN - Abnormal; Notable for the following:    Strep Pneumo  Urinary Antigen POSITIVE (*)    All other components within normal limits  PROTIME-INR - Abnormal; Notable for the following:    Prothrombin Time 17.5 (*)    All other components within normal limits  APTT - Abnormal; Notable for the following:    aPTT 39 (*)    All other components within normal limits  GLUCOSE, CAPILLARY - Abnormal; Notable for the following:    Glucose-Capillary 185 (*)    All other components within normal limits  GLUCOSE, CAPILLARY - Abnormal; Notable for the following:    Glucose-Capillary 213 (*)  All other components within normal limits  POCT I-STAT 3, BLOOD GAS (G3+) - Abnormal; Notable for the following:    pH, Arterial 7.331 (*)    pCO2 arterial 50.5 (*)    pO2, Arterial 187.0 (*)    Bicarbonate 26.4 (*)    All other components within normal limits  URINE CULTURE  CULTURE, BLOOD (ROUTINE X 2)  CULTURE, BLOOD (ROUTINE X 2)  CULTURE, RESPIRATORY (NON-EXPECTORATED)  MRSA PCR SCREENING  LACTIC ACID, PLASMA  PROCALCITONIN  INFLUENZA PANEL BY PCR  CBC  BASIC METABOLIC PANEL  BLOOD GAS, ARTERIAL  MAGNESIUM  PHOSPHORUS  LEGIONELLA ANTIGEN, URINE   Imaging Review Dg Chest Portable 1 View  02/07/2013   CLINICAL DATA:  Shortness of breath. Altered mental status. Acute respiratory failure.  EXAM: PORTABLE CHEST - 1 VIEW  COMPARISON:  02/07/2013  FINDINGS: There has been placement of an endotracheal tube, with tip approximately 4 cm above the chronic. On orogastric tube is seen coursing into the stomach.  Low lung volumes again demonstrated. Left retrocardiac opacity is unchanged. Right lung remains clear. Heart size is stable.  IMPRESSION: Endotracheal tube and orogastric tube in appropriate position. Opacification of left retrocardiac lung base, without significant change.   Electronically Signed   By: Myles RosenthalJohn  Stahl M.D.   On: 02/07/2013 20:44   Dg Chest Portable 1 View  02/07/2013   CLINICAL DATA:  Altered mental status.  Shortness of breath.  EXAM: PORTABLE  CHEST - 1 VIEW  COMPARISON:  Chest x-ray 04/19/2010.  FINDINGS: Lung volumes are low. Retrocardiac opacity at the base of the left hemithorax completely obscuring the left hemidiaphragm and blunting the left costophrenic sulcus suspicious for left lower lobe consolidation, likely with a small left pleural effusion. Right lung appears clear. Mild crowding of the pulmonary vasculature, likely accentuated by low lung volumes. Heart size is upper limits of normal. Upper mediastinal contours are within normal limits. Atherosclerosis in the thoracic aorta.  IMPRESSION: 1. Findings concerning for potential left lower lobe pneumonia, likely with a small left parapneumonic pleural effusion. Repeat radiographs within 2-3 weeks following appropriate trial of antimicrobial therapy is recommended to ensure resolution of these findings. 2. Atherosclerosis.   Electronically Signed   By: Trudie Reedaniel  Entrikin M.D.   On: 02/07/2013 18:47    EKG Interpretation    Date/Time:  Thursday February 07 2013 17:57:19 EST Ventricular Rate:  126 PR Interval:  134 QRS Duration: 98 QT Interval:  399 QTC Calculation: 578 R Axis:   -11 Text Interpretation:  Sinus tachycardia Borderline low voltage, extremity leads Prolonged QT interval Confirmed by BEATON  MD, ROBERT ([id number]) on 02/07/2013 6:09:03 PM           MDM   Febrile to 103. CXR c/w pneumonia. Initial BP of 102 systolic. Given IVF with improvement. Was placed on a NRB to maintain Oxygen saturations. Broad spectrum antibiotics given after obtaining cultures. Initial lactate of 3.7. UA not c/w UTI. The patient had an episode of decreased mental status noted by nursing at approximately 19:20. Patient was seen and evaluated. At that time she was a GCS of 14 after getting an ammonia stick by nursing. She was able to follow commands and appeared to be protecting her airway. PCCM was consulted and after they evaluated here recommended intubation. The patient was intubated without  difficulty. The patient was admitted to the ICU for continued workup and care.   1. Pneumonia   2. Encephalopathy   3. Sepsis   4. Acute respiratory failure  5. COPD (chronic obstructive pulmonary disease)        Shanon Ace, MD 02/08/13 [phone]

## 2013-02-07 NOTE — ED Notes (Signed)
Dr. Craige CottaSood made aware of pt's blood pressure

## 2013-02-07 NOTE — ED Notes (Signed)
Pt became diaphoretic, unresponsive to sternal rub with sluggish pupils.  MD notified.  CBG 191.  Blood gas ordered.  Pt responded slightly to ammonia.  GCS now 10.

## 2013-02-07 NOTE — ED Notes (Signed)
Dr.Sood at bedside  

## 2013-02-07 NOTE — ED Notes (Signed)
Pt placed on nrb for sats of 92 and labored breathing on 3L o2

## 2013-02-07 NOTE — ED Notes (Signed)
Levophed decreased to 5 mcg

## 2013-02-07 NOTE — Progress Notes (Addendum)
ANTIBIOTIC CONSULT NOTE - INITIAL  Pharmacy Consult for vancomycin and Zosyn (addendum added Levaquin) Indication: rule out sepsis  Allergies  Allergen Reactions  . Codeine Nausea And Vomiting    Patient Measurements: Height: 5' 4.17" (163 cm) Weight: 155 lb (70.308 kg) IBW/kg (Calculated) : 55.1  Vital Signs: Temp: 100.4 F (38 C) (01/01 1915) Temp src: Core (Comment) (01/01 1915) BP: 103/54 mmHg (01/01 1915) Pulse Rate: 111 (01/01 1915)  Intake/Output from this shift: Total I/O In: 1000 [I.V.:1000] Out: -   Labs:  Recent Labs  02/07/13 1809  WBC 26.1*  HGB 11.4*  PLT 218  CREATININE 0.70   Estimated Creatinine Clearance: 68.6 ml/min (by C-G formula based on Cr of 0.7). No results found for this basename: VANCOTROUGH, VANCOPEAK, VANCORANDOM, GENTTROUGH, GENTPEAK, GENTRANDOM, TOBRATROUGH, TOBRAPEAK, TOBRARND, AMIKACINPEAK, AMIKACINTROU, AMIKACIN,  in the last 72 hours   Microbiology: No results found for this or any previous visit (from the past 720 hour(s)).  Medical History: Past Medical History  Diagnosis Date  . Hypertension   . Hyperlipidemia   . Depression   . Anxiety   . Arthritis   . Grief 07/03/2011    Medications:  Infusions:  . vancomycin 1,250 mg (02/07/13 1922)   Assessment: 63 yo F presenting to ED after family called d/t patient being lethargic.  She reports n/v and feeling "sick" for prior week, but poor historian with AMS.  Pharmacy consulted to start vancomycin and Zosyn for sepsis.   Patient received one dose each of vancomycin 1250mg  and Zosyn 3.375gm in ER.  She is febrile with a Tmax of 103 and WBC is elevated at 26.1. Initial SCr is 0.7 with estimated CrCl ~69 (CrCl ~63 if round SCr up to 1).  Goal of Therapy:  Resolution of infection Vancomycin trough level 15-20 mcg/ml  Plan:  - continue Zosyn 3.375gm at q8h, extended infusion - begin 12h after loading dose, maintenance dose of vancomycin 1g q12h - plan to draw vancomycin  trough at SS - f/u WBC, temp curve, cultures, and clinical progression  Harrold DonathNathan E. Achilles Dunkope, PharmD Clinical Pharmacist - Resident Pager: [phone] Pharmacy: [phone] 02/07/2013 7:53 PM    Addendum ==================================== Patient now intubated.  Zosyn has been discontinued. Pharmacy consulted to dose Levaquin for PNA.  Patient also started on ceftriaxone and Tamiflu.  Of note, weight of patient has been corrected to 88 kg. New calculated CrCl ~77 (if SCr rounded to 1, then CrCl~69)  Plan: - start Levaquin IV 750mg  q24h - change vancomycin IV to 750mg  q8h - plan to draw vancomycin trough at SS - f/u WBC, temp curve, cultures, and clinical progression  Harrold DonathNathan E. Achilles Dunkope, PharmD Clinical Pharmacist - Resident Pager: [phone] Pharmacy: [phone] 02/07/2013 8:58 PM

## 2013-02-07 NOTE — ED Notes (Signed)
E-Link at bedside. Dr. Delford FieldWright recommends immediate intubation. EDP made aware and preparing for intubation.

## 2013-02-07 NOTE — H&P (Signed)
Name: Alexandra Richardson MRN: [id number] DOB: 08/12/1950    ADMISSION DATE:  02/07/2013  REFERRING MD :  ER  CHIEF COMPLAINT: Altered mental status  BRIEF PATIENT DESCRIPTION:  63 yo female smoker brought to ED after family noted decreased mental status, and had not been seen for 1 week.  Intubated in ER, and PCCM asked to admit to ICU.  SIGNIFICANT EVENTS: 1/01 Admit  STUDIES:   LINES / TUBES: ETT 1/01 >>  CULTURES: Sputum 1/01 >> Blood 1/01 >> Influenza PCR 1/01 >> Pneumococcal ag 1/01 >> Legionella Ag 1/01 >>  ANTIBIOTICS: Zosyn 1/01 >> 1/01  Vancomycin 1/01 >> Rocephin 1/01 >> Levaquin 1/01 >> Tamiflu 1/01 >>  HISTORY OF PRESENT ILLNESS:   63 yo female with hx of smoking and COPD was last seen by her family around Christmas.  Family was concerned they had not heard from her, and went to her house.  She was able to open door, but was not herself.  She was very sleepy, confused, and had trouble recognizing her family.  She was noted to have fever of 101F, and told her family she was coughing up sputum.  Family reports she did not get flu shot this year.  She also reported episodes of nausea and vomiting.  She was brought to the ED, and noted to be lethargic an hypoxic.  As a result she required intubation.  CXR showed LLL infiltrate and she was started on antibiotics.  Hx obtained from discussion with family, ER staff, and review of medical records.  PAST MEDICAL HISTORY :  Past Medical History  Diagnosis Date  . Hypertension   . Hyperlipidemia   . Depression   . Anxiety   . Arthritis   . Grief 07/03/2011   Past Surgical History  Procedure Laterality Date  . Cholecystectomy     Prior to Admission medications   Medication Sig Start Date End Date Taking? Authorizing Provider  albuterol (PROVENTIL HFA;VENTOLIN HFA) 108 (90 BASE) MCG/ACT inhaler Inhale 2 puffs into the lungs every 6 (six) hours as needed. For shortness of breath.    Historical Provider, MD    alprazolam Prudy Feeler) 2 MG tablet Take 2 mg by mouth 3 (three) times daily as needed. For anxiety.    Historical Provider, MD  atorvastatin (LIPITOR) 40 MG tablet Take 40 mg by mouth daily.    Historical Provider, MD  escitalopram (LEXAPRO) 20 MG tablet Take 40 mg by mouth daily.    Historical Provider, MD  furosemide (LASIX) 40 MG tablet Take 40 mg by mouth daily.    Historical Provider, MD  omeprazole (PRILOSEC) 20 MG capsule Take 20 mg by mouth daily.    Historical Provider, MD  Oxycodone-Acetaminophen (PERCOCET PO) Take 1 tablet by mouth every 6 (six) hours as needed. For pain.    Historical Provider, MD  risperiDONE (RISPERDAL) 2 MG tablet Take 2 mg by mouth 2 (two) times daily.    Historical Provider, MD   Allergies  Allergen Reactions  . Codeine Nausea And Vomiting    FAMILY HISTORY:  Family History  Problem Relation Age of Onset  . Coronary artery disease Father   . Diabetes type II Sister   . Hypertension Mother    SOCIAL HISTORY:  reports that she has been smoking.  She does not have any smokeless tobacco history on file. She reports that she drinks alcohol. She reports that she does not use illicit drugs.  REVIEW OF SYSTEMS:   Unable to obtain.  SUBJECTIVE:   VITAL SIGNS: Temp:  [100.4 F (38 C)-103 F (39.4 C)] 100.4 F (38 C) (01/01 1915) Pulse Rate:  [103-125] 103 (01/01 2025) Resp:  [20-28] 20 (01/01 2025) BP: (102-128)/(42-99) 126/68 mmHg (01/01 2025) SpO2:  [78 %-97 %] 95 % (01/01 2025) FiO2 (%):  [100 %] 100 % (01/01 2025) Weight:  [155 lb (70.308 kg)-194 lb 0.1 oz (88 kg)] 194 lb 0.1 oz (88 kg) (01/01 2012) HEMODYNAMICS:   VENTILATOR SETTINGS: Vent Mode:  [-] PRVC FiO2 (%):  [100 %] 100 % Set Rate:  [22 bmp] 22 bmp Vt Set:  [520 mL] 520 mL PEEP:  [5 cmH20] 5 cmH20 Plateau Pressure:  [25 cmH20] 25 cmH20 INTAKE / OUTPUT: Intake/Output     01/01 0701 - 01/02 0700   I.V. (mL/kg) 1000 (11.4)   Total Intake(mL/kg) 1000 (11.4)   Net +1000          PHYSICAL EXAMINATION: General:  Paralyzed Neuro:  Sedated HEENT:  Pupils reactive, ETT, OG tube in place Cardiovascular:  Regular, no murmur Lungs:  Prolonged exhalation, rales Lt base, scattered rhonchi Abdomen:  Soft, RUQ scar, no masses Musculoskeletal:  No edema Skin:  No rashes  LABS:  CBC  Recent Labs Lab 02/07/13 1809  WBC 26.1*  HGB 11.4*  HCT 33.2*  PLT 218   Coag's No results found for this basename: APTT, INR,  in the last 168 hours BMET  Recent Labs Lab 02/07/13 1809  NA 134*  K 3.9  CL 94*  CO2 23  BUN 24*  CREATININE 0.70  GLUCOSE 213*   Electrolytes  Recent Labs Lab 02/07/13 1809  CALCIUM 8.9   Sepsis Markers  Recent Labs Lab 02/07/13 1809  LATICACIDVEN 3.7*   ABG  Recent Labs Lab 02/07/13 1940  PHART 7.331*  PCO2ART 50.5*  PO2ART 187.0*   Liver Enzymes  Recent Labs Lab 02/07/13 1809  AST 43*  ALT 24  ALKPHOS 25*  BILITOT 1.4*  ALBUMIN 2.2*   Cardiac Enzymes No results found for this basename: TROPONINI, PROBNP,  in the last 168 hours Glucose  Recent Labs Lab 02/07/13 1756 02/07/13 1926  GLUCAP 224* 191*    Imaging Dg Chest Portable 1 View  02/07/2013   CLINICAL DATA:  Altered mental status.  Shortness of breath.  EXAM: PORTABLE CHEST - 1 VIEW  COMPARISON:  Chest x-ray 04/19/2010.  FINDINGS: Lung volumes are low. Retrocardiac opacity at the base of the left hemithorax completely obscuring the left hemidiaphragm and blunting the left costophrenic sulcus suspicious for left lower lobe consolidation, likely with a small left pleural effusion. Right lung appears clear. Mild crowding of the pulmonary vasculature, likely accentuated by low lung volumes. Heart size is upper limits of normal. Upper mediastinal contours are within normal limits. Atherosclerosis in the thoracic aorta.  IMPRESSION: 1. Findings concerning for potential left lower lobe pneumonia, likely with a small left parapneumonic pleural effusion. Repeat  radiographs within 2-3 weeks following appropriate trial of antimicrobial therapy is recommended to ensure resolution of these findings. 2. Atherosclerosis.   Electronically Signed   By: Trudie Reedaniel  Entrikin M.D.   On: 02/07/2013 18:47   ASSESSMENT / PLAN:  PULMONARY A: Acute respiratory failure 2nd to pneumonia. Hx of COPD with continue tobacco abuse. P:   -full vent support -monitor for PEEPi -f/u CXR, ABG -scheduled BD's  CARDIOVASCULAR A:  Hypotension after intubation. Hx of HTN, hyperlipidemia. P:  -give fluid bolus, and f/u lactic acid >> if no improvement, then place CVL and  start pressors -monitor hemodynamics  RENAL A:   No acute issues. P:   -monitor renal fx, urine outpt, electrolytes  GASTROINTESTINAL A:   Nutrition. P:   -NPO -tube feeds if unable to wean vent soon -Protonix for SUP  HEMATOLOGIC A:   Leukocytosis. P:  -f/u CBC -SQ heparin for DVT prevention  INFECTIOUS A:   Sepsis from pneumonia. P:   -vancomycin, rocephin, levaquin, tamiflu pending cx results  ENDOCRINE A:   Hx of borderline DM with hyperglycemia.   P:   -SSI  NEUROLOGIC A:   Acute encephalopathy 2nd to sepsis, respiratory failure. Hx of chronic pain, anxiety. P:   -sedation protocol while on vent >> goal RASS -1  Updated pt's family about plan.  CC time 60 minutes.  Coralyn Helling, MD [facility] Pulmonary/Critical Care 02/07/2013, 8:52 PM Pager:  [phone] After 3pm call: [phone]

## 2013-02-08 ENCOUNTER — Inpatient Hospital Stay (HOSPITAL_COMMUNITY): Payer: Medicare Other

## 2013-02-08 LAB — BLOOD GAS, ARTERIAL
Acid-base deficit: 4 mmol/L — ABNORMAL HIGH (ref 0.0–2.0)
Bicarbonate: 19 mEq/L — ABNORMAL LOW (ref 20.0–24.0)
Drawn by: 39899
FIO2: 0.6 %
MECHVT: 520 mL
O2 Saturation: 99.6 %
PEEP: 5 cmH2O
Patient temperature: 98.6
RATE: 22 resp/min
TCO2: 19.8 mmol/L (ref 0–100)
pCO2 arterial: 26.3 mmHg — ABNORMAL LOW (ref 35.0–45.0)
pH, Arterial: 7.472 — ABNORMAL HIGH (ref 7.350–7.450)
pO2, Arterial: 178 mmHg — ABNORMAL HIGH (ref 80.0–100.0)

## 2013-02-08 LAB — BASIC METABOLIC PANEL
BUN: 29 mg/dL — ABNORMAL HIGH (ref 6–23)
CO2: 22 mEq/L (ref 19–32)
Calcium: 8.3 mg/dL — ABNORMAL LOW (ref 8.4–10.5)
Chloride: 102 mEq/L (ref 96–112)
Creatinine, Ser: 0.78 mg/dL (ref 0.50–1.10)
GFR calc Af Amer: >90 mL/min (ref >90)
GFR calc non Af Amer: 87 mL/min — ABNORMAL LOW (ref >90)
Glucose, Bld: 157 mg/dL — ABNORMAL HIGH (ref 70–99)
Potassium: 3.4 mEq/L — ABNORMAL LOW (ref 3.7–5.3)
Sodium: 137 mEq/L (ref 137–147)

## 2013-02-08 LAB — GLUCOSE, CAPILLARY
Glucose-Capillary: 137 mg/dL — ABNORMAL HIGH (ref 70–99)
Glucose-Capillary: 143 mg/dL — ABNORMAL HIGH (ref 70–99)
Glucose-Capillary: 144 mg/dL — ABNORMAL HIGH (ref 70–99)
Glucose-Capillary: 149 mg/dL — ABNORMAL HIGH (ref 70–99)
Glucose-Capillary: 151 mg/dL — ABNORMAL HIGH (ref 70–99)
Glucose-Capillary: 193 mg/dL — ABNORMAL HIGH (ref 70–99)
Glucose-Capillary: 213 mg/dL — ABNORMAL HIGH (ref 70–99)

## 2013-02-08 LAB — INFLUENZA PANEL BY PCR (TYPE A & B)
H1N1 flu by pcr: NOT DETECTED
Influenza A By PCR: NEGATIVE
Influenza B By PCR: NEGATIVE

## 2013-02-08 LAB — MAGNESIUM: Magnesium: 2.2 mg/dL (ref 1.5–2.5)

## 2013-02-08 LAB — LEGIONELLA ANTIGEN, URINE: Legionella Antigen, Urine: NEGATIVE

## 2013-02-08 LAB — CBC
HCT: 33.6 % — ABNORMAL LOW (ref 36.0–46.0)
Hemoglobin: 11.2 g/dL — ABNORMAL LOW (ref 12.0–15.0)
MCH: 29.8 pg (ref 26.0–34.0)
MCHC: 33.3 g/dL (ref 30.0–36.0)
MCV: 89.4 fL (ref 78.0–100.0)
Platelets: 196 10*3/uL (ref 150–400)
RBC: 3.76 MIL/uL — ABNORMAL LOW (ref 3.87–5.11)
RDW: 15.1 % (ref 11.5–15.5)
WBC: 23.9 10*3/uL — ABNORMAL HIGH (ref 4.0–10.5)

## 2013-02-08 LAB — PHOSPHORUS: Phosphorus: 1.9 mg/dL — ABNORMAL LOW (ref 2.3–4.6)

## 2013-02-08 LAB — MRSA PCR SCREENING: MRSA by PCR: NEGATIVE

## 2013-02-08 MED ORDER — PANTOPRAZOLE SODIUM 40 MG PO PACK
40.0000 mg | PACK | Freq: Every day | ORAL | Status: DC
  Administered 2013-02-08: 40 mg
  Filled 2013-02-08 (×4): qty 20

## 2013-02-08 MED ORDER — VITAL HIGH PROTEIN PO LIQD
1000.0000 mL | ORAL | Status: DC
  Administered 2013-02-08: 1000 mL
  Filled 2013-02-08 (×7): qty 1000

## 2013-02-08 MED ORDER — POTASSIUM PHOSPHATE DIBASIC 3 MMOLE/ML IV SOLN
20.0000 mmol | Freq: Once | INTRAVENOUS | Status: AC
  Administered 2013-02-08: 20 mmol via INTRAVENOUS
  Filled 2013-02-08: qty 6.67

## 2013-02-08 MED ORDER — VITAL AF 1.2 CAL PO LIQD
1000.0000 mL | ORAL | Status: DC
  Administered 2013-02-08: 1000 mL
  Filled 2013-02-08 (×3): qty 1000

## 2013-02-08 MED ORDER — RISPERIDONE 1 MG PO TABS
1.0000 mg | ORAL_TABLET | Freq: Two times a day (BID) | ORAL | Status: DC
  Administered 2013-02-08 – 2013-02-12 (×9): 1 mg via ORAL
  Filled 2013-02-08 (×10): qty 1

## 2013-02-08 MED ORDER — FENTANYL CITRATE 0.05 MG/ML IJ SOLN
100.0000 ug | INTRAMUSCULAR | Status: DC | PRN
  Administered 2013-02-08 – 2013-02-09 (×7): 100 ug via INTRAVENOUS
  Filled 2013-02-08 (×7): qty 2

## 2013-02-08 MED ORDER — FUROSEMIDE 40 MG PO TABS
40.0000 mg | ORAL_TABLET | Freq: Every day | ORAL | Status: DC
Start: 2013-02-08 — End: 2013-02-12
  Administered 2013-02-08 – 2013-02-12 (×5): 40 mg via ORAL
  Filled 2013-02-08 (×5): qty 1

## 2013-02-08 MED ORDER — HYDROMORPHONE HCL 1 MG/ML PO LIQD: 1.0000 mg | ORAL | Status: DC | PRN

## 2013-02-08 MED FILL — Medication: Qty: 1 | Status: AC

## 2013-02-08 NOTE — Progress Notes (Signed)
INITIAL NUTRITION ASSESSMENT  DOCUMENTATION CODES Per approved criteria  -Obesity Unspecified   INTERVENTION: 1. D/C Vital AF 1.2  2. Start Vital High Protein @ 35 ml/hr and increase by 10 ml every 4 hours to goal rate of 55 ml/hr.   TF regimen provides: 1320 kcal (24 kcal/kg IBW), 115 grams protein, and 1003 ml H2O.   NUTRITION DIAGNOSIS: Inadequate oral intake related to inability to eat as evidenced by NPO status.  Goal: Enteral nutrition to provide 60-70% of estimated calorie needs (22-25 kcals/kg ideal body weight) and 100% of estimated protein needs, based on ASPEN guidelines for permissive underfeeding in critically ill obese individuals.  Monitor:  Vent status, TF initiation and tolerance, weight trend, labs   Reason for Assessment: Consult received to initiate and manage enteral nutrition support.  63 y.o. female  Admitting Dx: <principal problem not specified>  ASSESSMENT: Pt admitted with altered mental status and intubated. Pt with acute respiratory failure secondary to pneumococcal PNA. Pt with hx of COPD.  Patient is currently intubated on ventilator support.  MV: 12 L/min Temp (24hrs), Avg:98.8 F (37.1 C), Min:97.4 F (36.3 C), Max:103 F (39.4 C)  Nutrition-focused physical exam WNL.  Height: Ht Readings from Last 1 Encounters:  02/07/13 5' 4.17" (1.63 m)    Weight: Wt Readings from Last 1 Encounters:  02/08/13 201 lb 4.5 oz (91.3 kg)    Ideal Body Weight: 54.5 kg   % Ideal Body Weight: 168%  Wt Readings from Last 10 Encounters:  02/08/13 201 lb 4.5 oz (91.3 kg)  07/02/11 194 lb 0.1 oz (88 kg)    Usual Body Weight: 194 lb   % Usual Body Weight: > 100%  BMI:  Body mass index is 34.36 kg/(m^2).  Estimated Nutritional Needs: Kcal: 1869 Protein: >/= 109 grams protein Fluid: > 1.8 L/day  Skin: no issues   Diet Order:    EDUCATION NEEDS: -No education needs identified at this time   Intake/Output Summary (Last 24 hours) at  02/08/13 1334 Last data filed at 02/08/13 1300  Gross per 24 hour  Intake 2707.2 ml  Output    775 ml  Net 1932.2 ml    Last BM: PTA   Labs:   Recent Labs Lab 02/07/13 1809 02/07/13 2109 02/08/13 0324  NA 134*  --  137  K 3.9  --  3.4*  CL 94*  --  102  CO2 23  --  22  BUN 24*  --  29*  CREATININE 0.70 0.75 0.78  CALCIUM 8.9  --  8.3*  MG  --   --  2.2  PHOS  --   --  1.9*  GLUCOSE 213*  --  157*    CBG (last 3)   Recent Labs  02/08/13 0448 02/08/13 0747 02/08/13 1211  GLUCAP 143* 137* 144*    Scheduled Meds: . albuterol  2.5 mg Nebulization Q6H  . antiseptic oral rinse  15 mL Mouth Rinse q12n4p  . cefTRIAXone (ROCEPHIN)  IV  1 g Intravenous Q24H  . chlorhexidine  15 mL Mouth Rinse BID  . furosemide  40 mg Oral Daily  . heparin  5,000 Units Subcutaneous Q8H  . insulin aspart  0-15 Units Subcutaneous Q4H  . ipratropium  0.5 mg Nebulization Q6H  . levofloxacin (LEVAQUIN) IV  750 mg Intravenous Q24H  . oseltamivir  75 mg Per Tube BID  . pantoprazole sodium  40 mg Per Tube Q1200  . risperiDONE  1 mg Oral BID  . vancomycin  750 mg Intravenous Q8H    Continuous Infusions: . feeding supplement (VITAL AF 1.2 CAL) 1,000 mL (02/08/13 1300)  . norepinephrine (LEVOPHED) Adult infusion Stopped (02/07/13 2320)  . propofol Stopped (02/07/13 2045)    Past Medical History  Diagnosis Date  . Hypertension   . Hyperlipidemia   . Depression   . Anxiety   . Arthritis   . Grief 07/03/2011    Past Surgical History  Procedure Laterality Date  . Cholecystectomy      Kendell BaneHeather Yahsir Wickens RD, LDN, CNSC [phone] Pager [phone] After Hours Pager

## 2013-02-08 NOTE — Progress Notes (Addendum)
Name: Alexandra Richardson MRN: [id number] DOB: April 13, 1950    ADMISSION DATE:  02/07/2013  REFERRING MD :  ER  CHIEF COMPLAINT: Altered mental status  BRIEF PATIENT DESCRIPTION:  63 yo female smoker brought to ED after family noted decreased mental status, and had not been seen for 1 week.  Intubated in ER, and PCCM asked to admit to ICU.  SIGNIFICANT EVENTS: 1/01 Admit  STUDIES:   LINES / TUBES: ETT 1/01 >>  CULTURES: Sputum 1/01 >> Blood 1/01 >> Influenza PCR 1/01 >> Pneumococcal ag 1/01 >>POS Legionella Ag 1/01 >>  ANTIBIOTICS: Zosyn 1/01 >> 1/01  Vancomycin 1/01 >> Rocephin 1/01 >> Levaquin 1/01 >> Tamiflu 1/01 >>  SUBJECTIVE:  Febrile Unresponsive , off sedation intubated  VITAL SIGNS: Temp:  [97.4 F (36.3 C)-103 F (39.4 C)] 97.6 F (36.4 C) (01/02 0750) Pulse Rate:  [87-125] 100 (01/02 0700) Resp:  [18-28] 24 (01/02 0700) BP: (76-131)/(36-99) 102/61 mmHg (01/02 0700) SpO2:  [90 %-98 %] 93 % (01/02 0700) FiO2 (%):  [60 %-100 %] 60 % (01/02 0327) Weight:  [70.308 kg (155 lb)-91.3 kg (201 lb 4.5 oz)] 91.3 kg (201 lb 4.5 oz) (01/02 0500) HEMODYNAMICS:   VENTILATOR SETTINGS: Vent Mode:  [-] PRVC FiO2 (%):  [60 %-100 %] 60 % Set Rate:  [22 bmp] 22 bmp Vt Set:  [520 mL] 520 mL PEEP:  [5 cmH20] 5 cmH20 Plateau Pressure:  [21 cmH20-25 cmH20] 21 cmH20 INTAKE / OUTPUT: Intake/Output     01/01 0701 - 01/02 0700 01/02 0701 - 01/03 0700   I.V. (mL/kg) 2048.2 (22.4)    IV Piggyback 350    Total Intake(mL/kg) 2398.2 (26.3)    Urine (mL/kg/hr) 270    Total Output 270     Net +2128.2            PHYSICAL EXAMINATION: General: unresponsive Neuro:  Does not follow commands HEENT:  Pupils reactive, ETT, OG tube in place Cardiovascular:  Regular, no murmur Lungs:  Prolonged exhalation, rales Lt base, scattered rhonchi Abdomen:  Soft, RUQ scar, no masses Musculoskeletal:  No edema Skin:  No rashes  LABS:  CBC  Recent Labs Lab 02/07/13 1809  02/07/13 2109 02/08/13 0324  WBC 26.1* 23.3* 23.9*  HGB 11.4* 11.0* 11.2*  HCT 33.2* 32.0* 33.6*  PLT 218 182 196   Coag's  Recent Labs Lab 02/07/13 2109  APTT 39*  INR 1.48   BMET  Recent Labs Lab 02/07/13 1809 02/07/13 2109 02/08/13 0324  NA 134*  --  137  K 3.9  --  3.4*  CL 94*  --  102  CO2 23  --  22  BUN 24*  --  29*  CREATININE 0.70 0.75 0.78  GLUCOSE 213*  --  157*   Electrolytes  Recent Labs Lab 02/07/13 1809 02/08/13 0324  CALCIUM 8.9 8.3*  MG  --  2.2  PHOS  --  1.9*   Sepsis Markers  Recent Labs Lab 02/07/13 1809 02/07/13 2109  LATICACIDVEN 3.7* 1.8  PROCALCITON  --  12.70   ABG  Recent Labs Lab 02/07/13 1940 02/08/13 0445  PHART 7.331* 7.472*  PCO2ART 50.5* 26.3*  PO2ART 187.0* 178.0*   Liver Enzymes  Recent Labs Lab 02/07/13 1809  AST 43*  ALT 24  ALKPHOS 25*  BILITOT 1.4*  ALBUMIN 2.2*   Cardiac Enzymes No results found for this basename: TROPONINI, PROBNP,  in the last 168 hours Glucose  Recent Labs Lab 02/07/13 1756 02/07/13 1926 02/07/13 2153  02/07/13 2333 02/08/13 0111 02/08/13 0448  GLUCAP 224* 191* 185* 213* 193* 143*    Imaging Dg Chest Portable 1 View  02/07/2013   CLINICAL DATA:  Shortness of breath. Altered mental status. Acute respiratory failure.  EXAM: PORTABLE CHEST - 1 VIEW  COMPARISON:  02/07/2013  FINDINGS: There has been placement of an endotracheal tube, with tip approximately 4 cm above the chronic. On orogastric tube is seen coursing into the stomach.  Low lung volumes again demonstrated. Left retrocardiac opacity is unchanged. Right lung remains clear. Heart size is stable.  IMPRESSION: Endotracheal tube and orogastric tube in appropriate position. Opacification of left retrocardiac lung base, without significant change.   Electronically Signed   By: Myles Rosenthal M.D.   On: 02/07/2013 20:44   Dg Chest Portable 1 View  02/07/2013   CLINICAL DATA:  Altered mental status.  Shortness of breath.   EXAM: PORTABLE CHEST - 1 VIEW  COMPARISON:  Chest x-ray 04/19/2010.  FINDINGS: Lung volumes are low. Retrocardiac opacity at the base of the left hemithorax completely obscuring the left hemidiaphragm and blunting the left costophrenic sulcus suspicious for left lower lobe consolidation, likely with a small left pleural effusion. Right lung appears clear. Mild crowding of the pulmonary vasculature, likely accentuated by low lung volumes. Heart size is upper limits of normal. Upper mediastinal contours are within normal limits. Atherosclerosis in the thoracic aorta.  IMPRESSION: 1. Findings concerning for potential left lower lobe pneumonia, likely with a small left parapneumonic pleural effusion. Repeat radiographs within 2-3 weeks following appropriate trial of antimicrobial therapy is recommended to ensure resolution of these findings. 2. Atherosclerosis.   Electronically Signed   By: Trudie Reed M.D.   On: 02/07/2013 18:47   ASSESSMENT / PLAN:  PULMONARY A: Acute respiratory failure 2nd to pneumococal pneumonia. Hx of COPD with continue tobacco abuse. P:   -SBTs -lower RR for PEEPi -scheduled BD's  CARDIOVASCULAR A:  Hypotension after intubation. Hx of HTN, hyperlipidemia. P:  -monitor hemodynamics  RENAL A:   No acute issues. P:   -monitor renal fx, urine outpt, electrolytes -resume lasix  GASTROINTESTINAL A:   Nutrition. P:   -tube feeds if unable to wean vent soon -Protonix for SUP  HEMATOLOGIC A:   Leukocytosis. P:  -f/u CBC -SQ heparin for DVT prevention  INFECTIOUS A:   Sepsis from pneumonia. P:   -vancomycin, rocephin, levaquin, tamiflu pending cx results  ENDOCRINE A:   Hx of borderline DM with hyperglycemia.   P:   -SSI  NEUROLOGIC A:   Acute encephalopathy 2nd to sepsis, respiratory failure. Hx of chronic pain, anxiety. P:   -sedation protocol while on vent >> goal RASS 0 -resume risperdal 1/2 home dose & titrate  Summary - Pneumococal  pna, await flu test Start wean   The patient is critically ill with multiple organ systems failure and requires high complexity decision making for assessment and support, frequent evaluation and titration of therapies, application of advanced monitoring technologies and extensive interpretation of multiple databases. Critical Care Time devoted to patient care services described in this note is 35 minutes.    Cyril Mourning MD. Tonny Bollman. [place] [facility] Pager [phone] If no response call [phone]       02/08/2013, 8:09 AM

## 2013-02-08 NOTE — ED Provider Notes (Addendum)
I saw and evaluated the patient, reviewed the resident's note and I agree with the findings and plan.   .Face to face Exam:  General:  Lethargic.  Ill appearing HEENT:  Atraumatic Resp:  Tacypneic Abd:  Nondistended Neuro:Unable to complete  CRITICAL CARE Performed by: Nelva NayBEATON,Shenequa Howse L Total critical care time: 60 min Critical care time was exclusive of separately billable procedures and treating other patients. Critical care was necessary to treat or prevent imminent or life-threatening deterioration. Critical care was time spent personally by me on the following activities: development of treatment plan with patient and/or surrogate as well as nursing, discussions with consultants, evaluation of patient's response to treatment, examination of patient, obtaining history from patient or surrogate, ordering and performing treatments and interventions, ordering and review of laboratory studies, ordering and review of radiographic studies, pulse oximetry and re-evaluation of patient's condition.   I reviewed and interpreted the EKG with resident.  I was present and supervised patient intubation.  See resident note for details.    Nelia Shiobert L Amanat Hackel, MD 04/13/13 [phone]

## 2013-02-08 NOTE — Progress Notes (Signed)
UR completed.  Jazzelle Zhang, RN BSN MHA CCM Trauma/Neuro ICU Case Manager [phone]  

## 2013-02-08 NOTE — Progress Notes (Signed)
70 mg of propofol wasted in black box with Selena BattenKim, Charity fundraiserN. Jacqulynn CadetPotter, Ahnya Akre A

## 2013-02-08 NOTE — Progress Notes (Signed)
eLink Physician-Brief Progress Note Patient Name: Alexandra BlamerDorothy L Leone DOB: [birth year] MRN: [id number]  Date of Service  02/08/2013   HPI/Events of Note   K and Phosph low  eICU Interventions  Kphos IV given   Intervention Category Major Interventions: Electrolyte abnormality - evaluation and management  Shan Levansatrick Javelle Donigan 02/08/2013, 5:21 AM

## 2013-02-08 NOTE — Progress Notes (Signed)
[facility] lab called to notify of patient's positive blood culture-aerobic gram + cocci and chains. [facility]. MD was in code at the time. Stated she would pass along.

## 2013-02-08 NOTE — Progress Notes (Signed)
eLink Physician-Brief Progress Note Patient Name: Alexandra BlamerDorothy L Blake DOB: 07-24-1950 MRN: [id number]  Date of Service  02/08/2013   HPI/Events of Note     eICU Interventions  Flu PCR negative - tamiflu d/c'd   Intervention Category Minor Interventions: Routine modifications to care plan (e.g. PRN medications for pain, fever)  Karli Wickizer S. 02/08/2013, 8:55 PM

## 2013-02-09 ENCOUNTER — Inpatient Hospital Stay (HOSPITAL_COMMUNITY): Payer: Medicare Other

## 2013-02-09 DIAGNOSIS — A419 Sepsis, unspecified organism: Secondary | ICD-10-CM

## 2013-02-09 DIAGNOSIS — J449 Chronic obstructive pulmonary disease, unspecified: Secondary | ICD-10-CM

## 2013-02-09 DIAGNOSIS — J189 Pneumonia, unspecified organism: Secondary | ICD-10-CM

## 2013-02-09 DIAGNOSIS — J9600 Acute respiratory failure, unspecified whether with hypoxia or hypercapnia: Secondary | ICD-10-CM

## 2013-02-09 DIAGNOSIS — R4182 Altered mental status, unspecified: Secondary | ICD-10-CM

## 2013-02-09 LAB — BASIC METABOLIC PANEL
BUN: 25 mg/dL — ABNORMAL HIGH (ref 6–23)
BUN: 22 mg/dL (ref 6–23)
CO2: 24 mEq/L (ref 19–32)
CO2: 27 mEq/L (ref 19–32)
Calcium: 8.5 mg/dL (ref 8.4–10.5)
Calcium: 8.7 mg/dL (ref 8.4–10.5)
Chloride: 105 mEq/L (ref 96–112)
Chloride: 99 mEq/L (ref 96–112)
Creatinine, Ser: 0.67 mg/dL (ref 0.50–1.10)
Creatinine, Ser: 0.65 mg/dL (ref 0.50–1.10)
GFR calc Af Amer: >90 mL/min (ref >90)
GFR calc Af Amer: >90 mL/min (ref >90)
GFR calc non Af Amer: >90 mL/min (ref >90)
GFR calc non Af Amer: >90 mL/min (ref >90)
Glucose, Bld: 170 mg/dL — ABNORMAL HIGH (ref 70–99)
Glucose, Bld: 178 mg/dL — ABNORMAL HIGH (ref 70–99)
Potassium: 3.1 mEq/L — ABNORMAL LOW (ref 3.7–5.3)
Potassium: 3.3 mEq/L — ABNORMAL LOW (ref 3.7–5.3)
Sodium: 142 mEq/L (ref 137–147)
Sodium: 138 mEq/L (ref 137–147)

## 2013-02-09 LAB — GLUCOSE, CAPILLARY
Glucose-Capillary: 103 mg/dL — ABNORMAL HIGH (ref 70–99)
Glucose-Capillary: 156 mg/dL — ABNORMAL HIGH (ref 70–99)
Glucose-Capillary: 162 mg/dL — ABNORMAL HIGH (ref 70–99)
Glucose-Capillary: 165 mg/dL — ABNORMAL HIGH (ref 70–99)
Glucose-Capillary: 177 mg/dL — ABNORMAL HIGH (ref 70–99)
Glucose-Capillary: 193 mg/dL — ABNORMAL HIGH (ref 70–99)
Glucose-Capillary: 195 mg/dL — ABNORMAL HIGH (ref 70–99)

## 2013-02-09 LAB — CBC
HCT: 31.7 % — ABNORMAL LOW (ref 36.0–46.0)
Hemoglobin: 10.7 g/dL — ABNORMAL LOW (ref 12.0–15.0)
MCH: 29.9 pg (ref 26.0–34.0)
MCHC: 33.8 g/dL (ref 30.0–36.0)
MCV: 88.5 fL (ref 78.0–100.0)
Platelets: 193 10*3/uL (ref 150–400)
RBC: 3.58 MIL/uL — ABNORMAL LOW (ref 3.87–5.11)
RDW: 15.1 % (ref 11.5–15.5)
WBC: 20 10*3/uL — ABNORMAL HIGH (ref 4.0–10.5)

## 2013-02-09 MED ORDER — ACETAMINOPHEN 325 MG PO TABS
650.0000 mg | ORAL_TABLET | Freq: Four times a day (QID) | ORAL | Status: DC | PRN
  Administered 2013-02-09 – 2013-02-10 (×2): 650 mg via ORAL
  Filled 2013-02-09 (×2): qty 2

## 2013-02-09 MED ORDER — ESCITALOPRAM OXALATE 20 MG PO TABS
40.0000 mg | ORAL_TABLET | Freq: Every day | ORAL | Status: DC
  Administered 2013-02-09 – 2013-02-12 (×4): 40 mg via ORAL
  Filled 2013-02-09 (×4): qty 2

## 2013-02-09 MED ORDER — POTASSIUM CHLORIDE 20 MEQ/15ML (10%) PO LIQD
30.0000 meq | ORAL | Status: AC
  Administered 2013-02-09 (×2): 30 meq
  Filled 2013-02-09 (×2): qty 22.5

## 2013-02-09 MED ORDER — BIOTENE DRY MOUTH MT LIQD
15.0000 mL | Freq: Two times a day (BID) | OROMUCOSAL | Status: DC
  Administered 2013-02-09 – 2013-02-12 (×5): 15 mL via OROMUCOSAL

## 2013-02-09 NOTE — Progress Notes (Signed)
[facility]OC[facility] FOR AM LAB REPLACEMENT ONLY  The [facility] does [facility] Protocol based on the criteria listed below:   1. Is GFR >/= 40 ml/min? yes  Patient's GFR today is >90 2. Is urine output >/= 0.5 ml/kg/hr for the last 6 hours? yes Patient's UOP is .5 ml/kg/hr 3. Is BUN < 60 mg/dL? yes  Patient's BUN today is 25 4. Abnormal electrolyte(s):Potassium 5. Ordered repletion with: Potassium per Protocol  Alexandra Richardson P 02/09/2013 5:31 AM

## 2013-02-09 NOTE — Procedures (Signed)
Extubation Procedure Note  Patient Details:   Name: Alexandra Richardson DOB: [birth year] MRN: [id number]   Airway Documentation:     Evaluation  O2 sats: stable throughout Complications: No apparent complications Patient did tolerate procedure well. Bilateral Breath Sounds: Diminished;Rhonchi Suctioning: Airway Yes  Patient laced on 5 LNC, SAT 93%. Able to vocalize and clear secretions  Alexandra Richardson, Alexandra Richardson 02/09/2013, 11:26 AM

## 2013-02-09 NOTE — Progress Notes (Signed)
Name: Alexandra Richardson MRN: [id number] DOB: Feb 19, 1950    ADMISSION DATE:  02/07/2013  REFERRING MD :  ER  CHIEF COMPLAINT: Altered mental status  BRIEF PATIENT DESCRIPTION:  63 yo female smoker brought to ED after family noted decreased mental status, and had not been seen for 1 week.  Intubated in ER, and PCCM asked to admit to ICU.  SIGNIFICANT EVENTS: 1/01 Admit  STUDIES:   LINES / TUBES: ETT 1/01 >>  CULTURES: Sputum 1/01 >> Blood 1/01 >> strep species >>  Influenza PCR 1/01 >> Pneumococcal ag 1/01 >>POS Legionella Ag 1/01 >>  ANTIBIOTICS: Zosyn 1/01 >> 1/01  Vancomycin 1/01 >> Rocephin 1/01 >> Levaquin 1/01 >> 1/3 Tamiflu 1/01 >> 1/2  SUBJECTIVE:   Awake and tolerating SBT tamiflu d/c'd last night  VITAL SIGNS: Temp:  [99.2 F (37.3 C)-99.8 F (37.7 C)] 99.7 F (37.6 C) (01/03 0700) Pulse Rate:  [96-119] 104 (01/03 0700) Resp:  [19-31] 22 (01/03 0700) BP: (89-129)/(54-115) 113/68 mmHg (01/03 0824) SpO2:  [89 %-99 %] 94 % (01/03 0700) FiO2 (%):  [40 %-60 %] 40 % (01/03 0824) Weight:  [92.1 kg (203 lb 0.7 oz)] 92.1 kg (203 lb 0.7 oz) (01/03 0500) HEMODYNAMICS:   VENTILATOR SETTINGS: Vent Mode:  [-] CPAP;PSV FiO2 (%):  [40 %-60 %] 40 % Set Rate:  [18 bmp] 18 bmp Vt Set:  [520 mL] 520 mL PEEP:  [5 cmH20] 5 cmH20 Pressure Support:  [5 cmH20] 5 cmH20 Plateau Pressure:  [12 cmH20-18 cmH20] 12 cmH20 INTAKE / OUTPUT: Intake/Output     01/02 0701 - 01/03 0700 01/03 0701 - 01/04 0700   I.V. (mL/kg) 490.7 (5.3)    NG/GT 844.1    IV Piggyback 650    Total Intake(mL/kg) 1984.8 (21.6)    Urine (mL/kg/hr) 1735 (0.8)    Total Output 1735     Net +249.8            PHYSICAL EXAMINATION: General: awake, obese woman, on MV Neuro:  A&Ox3, follows commands, moves all ext HEENT:  Pupils reactive, ETT, OG tube in place Cardiovascular:  Regular, no murmur Lungs:  Prolonged exhalation, rales Lt base, scattered rhonchi, no wheezes Abdomen:  Soft, RUQ scar,  no masses Musculoskeletal:  No edema Skin:  No rashes  LABS:  CBC  Recent Labs Lab 02/07/13 2109 02/08/13 0324 02/09/13 0410  WBC 23.3* 23.9* 20.0*  HGB 11.0* 11.2* 10.7*  HCT 32.0* 33.6* 31.7*  PLT 182 196 193   Coag's  Recent Labs Lab 02/07/13 2109  APTT 39*  INR 1.48   BMET  Recent Labs Lab 02/07/13 1809 02/07/13 2109 02/08/13 0324 02/09/13 0410  NA 134*  --  137 142  K 3.9  --  3.4* 3.1*  CL 94*  --  102 105  CO2 23  --  22 24  BUN 24*  --  29* 25*  CREATININE 0.70 0.75 0.78 0.67  GLUCOSE 213*  --  157* 170*   Electrolytes  Recent Labs Lab 02/07/13 1809 02/08/13 0324 02/09/13 0410  CALCIUM 8.9 8.3* 8.5  MG  --  2.2  --   PHOS  --  1.9*  --    Sepsis Markers  Recent Labs Lab 02/07/13 1809 02/07/13 2109  LATICACIDVEN 3.7* 1.8  PROCALCITON  --  12.70   ABG  Recent Labs Lab 02/07/13 1940 02/08/13 0445  PHART 7.331* 7.472*  PCO2ART 50.5* 26.3*  PO2ART 187.0* 178.0*   Liver Enzymes  Recent Labs Lab 02/07/13  1809  AST 43*  ALT 24  ALKPHOS 25*  BILITOT 1.4*  ALBUMIN 2.2*   Cardiac Enzymes No results found for this basename: TROPONINI, PROBNP,  in the last 168 hours Glucose  Recent Labs Lab 02/08/13 1211 02/08/13 1553 02/08/13 2001 02/08/13 2337 02/09/13 0335 02/09/13 0746  GLUCAP 144* 149* 151* 165* 162* 195*    Imaging Dg Chest Port 1 View  02/09/2013   CLINICAL DATA:  Pneumonia.  EXAM: PORTABLE CHEST - 1 VIEW  COMPARISON:  02/08/2013  FINDINGS: Endotracheal tube is 2.4 cm above the carina. Nasogastric tube extends into the abdomen. There is persistent opacification at the left lung base with hazy densities throughout the left lung. Slightly increased densities in the right lung may represent volume loss. There is no evidence for a pneumothorax. Heart size is grossly stable.  IMPRESSION: Persistent left lung densities are suggestive for pleural fluid and possibly airspace disease.  Support apparatuses as described.    Electronically Signed   By: Richarda Overlie M.D.   On: 02/09/2013 07:58   Dg Chest Port 1 View  02/08/2013   CLINICAL DATA:  Pneumonia follow-up.  EXAM: PORTABLE CHEST - 1 VIEW  COMPARISON:  02/07/2013  FINDINGS: Endotracheal tube tip 3.2 cm above the carina.  Nasogastric tube has been retracted with the tip at the level of the aortic arch and side hole at the thoracic inlet level. This needs to be repositioned/ removed.  Further consolidation left lung may represent posteriorly layering pleural effusion with left lower lobe atelectasis/ infiltrate. This limits detection for possible of underlying mass. Recommend followup until clearance.  No gross pneumothorax.  Pulmonary vascular prominence most notable centrally.  Cardiomegaly.  Calcified aorta.  IMPRESSION: Nasogastric tube has been retracted with the tip at the level of the aortic arch and side hole at the thoracic inlet level. This needs to be repositioned/ removed.  Further consolidation left lung may represent posteriorly layering pleural effusion with left lower lobe atelectasis/ infiltrate.  Pulmonary vascular prominence most notable centrally.  Cardiomegaly.  This is a call report.   Electronically Signed   By: Bridgett Larsson M.D.   On: 02/08/2013 08:22   Dg Chest Portable 1 View  02/07/2013   CLINICAL DATA:  Shortness of breath. Altered mental status. Acute respiratory failure.  EXAM: PORTABLE CHEST - 1 VIEW  COMPARISON:  02/07/2013  FINDINGS: There has been placement of an endotracheal tube, with tip approximately 4 cm above the chronic. On orogastric tube is seen coursing into the stomach.  Low lung volumes again demonstrated. Left retrocardiac opacity is unchanged. Right lung remains clear. Heart size is stable.  IMPRESSION: Endotracheal tube and orogastric tube in appropriate position. Opacification of left retrocardiac lung base, without significant change.   Electronically Signed   By: Myles Rosenthal M.D.   On: 02/07/2013 20:44   Dg Chest Portable 1  View  02/07/2013   CLINICAL DATA:  Altered mental status.  Shortness of breath.  EXAM: PORTABLE CHEST - 1 VIEW  COMPARISON:  Chest x-ray 04/19/2010.  FINDINGS: Lung volumes are low. Retrocardiac opacity at the base of the left hemithorax completely obscuring the left hemidiaphragm and blunting the left costophrenic sulcus suspicious for left lower lobe consolidation, likely with a small left pleural effusion. Right lung appears clear. Mild crowding of the pulmonary vasculature, likely accentuated by low lung volumes. Heart size is upper limits of normal. Upper mediastinal contours are within normal limits. Atherosclerosis in the thoracic aorta.  IMPRESSION: 1. Findings concerning for  potential left lower lobe pneumonia, likely with a small left parapneumonic pleural effusion. Repeat radiographs within 2-3 weeks following appropriate trial of antimicrobial therapy is recommended to ensure resolution of these findings. 2. Atherosclerosis.   Electronically Signed   By: Trudie Reed M.D.   On: 02/07/2013 18:47   ASSESSMENT / PLAN:  PULMONARY A: Acute respiratory failure 2nd to pneumococal pneumonia. Hx of COPD with continue tobacco abuse. P:   -plan extubate today 1/3 -scheduled BD's  CARDIOVASCULAR A:  Hypotension after intubation. Hx of HTN, hyperlipidemia. P:  -monitor hemodynamics, pressors weaned to off  RENAL A:   Hypokalemia P:   -monitor renal fx, urine outpt, electrolytes -resume lasix -potassium replacement > recheck BMP pm 1/3  GASTROINTESTINAL A:   Nutrition. P:   -Protonix for SUP -start diet post-extubation  HEMATOLOGIC A:   Leukocytosis. P:  -f/u CBC -SQ heparin for DVT prevention  INFECTIOUS A:   Sepsis from pneumonia, presumed pneumococcal with single blood cx positive P:   -d/c levaquin -d/c'd tamiflu -continue ceftriaxone and vanco -probably d/c vanco on 1/4 when blood cx further speciated  ENDOCRINE A:   Hx of borderline DM with hyperglycemia.    P:   -SSI  NEUROLOGIC A:   Acute encephalopathy 2nd to sepsis, respiratory failure. Hx of chronic pain, anxiety. P:   -continue risperdal 1/2 home dose & titrate - restart lexapro    The patient is critically ill with multiple organ systems failure and requires high complexity decision making for assessment and support, frequent evaluation and titration of therapies, application of advanced monitoring technologies and extensive interpretation of multiple databases. Critical Care Time devoted to patient care services described in this note is 45 minutes.    Levy Pupa, MD, PhD 02/09/2013, 11:32 AM [place] [place] [facility] [phone] or if no answer [phone]

## 2013-02-09 NOTE — Progress Notes (Signed)
E-link MD paged and made aware of tachycardia even at rest. No new orders at this time. Will continue to monitor. Jacqulynn CadetPotter, Wreatha Sturgeon A

## 2013-02-10 LAB — CULTURE, BLOOD (ROUTINE X 2)

## 2013-02-10 LAB — BASIC METABOLIC PANEL
BUN: 19 mg/dL (ref 6–23)
CO2: 27 mEq/L (ref 19–32)
Calcium: 8.6 mg/dL (ref 8.4–10.5)
Chloride: 102 mEq/L (ref 96–112)
Creatinine, Ser: 0.59 mg/dL (ref 0.50–1.10)
GFR calc Af Amer: >90 mL/min (ref >90)
GFR calc non Af Amer: >90 mL/min (ref >90)
Glucose, Bld: 138 mg/dL — ABNORMAL HIGH (ref 70–99)
Potassium: 3.5 mEq/L — ABNORMAL LOW (ref 3.7–5.3)
Sodium: 141 mEq/L (ref 137–147)

## 2013-02-10 LAB — URINE CULTURE: Colony Count: >=100000

## 2013-02-10 LAB — CULTURE, RESPIRATORY W GRAM STAIN: Culture: NO GROWTH

## 2013-02-10 LAB — GLUCOSE, CAPILLARY
Glucose-Capillary: 131 mg/dL — ABNORMAL HIGH (ref 70–99)
Glucose-Capillary: 121 mg/dL — ABNORMAL HIGH (ref 70–99)
Glucose-Capillary: 132 mg/dL — ABNORMAL HIGH (ref 70–99)
Glucose-Capillary: 117 mg/dL — ABNORMAL HIGH (ref 70–99)
Glucose-Capillary: 136 mg/dL — ABNORMAL HIGH (ref 70–99)

## 2013-02-10 LAB — CBC
HCT: 31.7 % — ABNORMAL LOW (ref 36.0–46.0)
Hemoglobin: 10.8 g/dL — ABNORMAL LOW (ref 12.0–15.0)
MCH: 29.9 pg (ref 26.0–34.0)
MCHC: 34.1 g/dL (ref 30.0–36.0)
MCV: 87.8 fL (ref 78.0–100.0)
Platelets: 210 10*3/uL (ref 150–400)
RBC: 3.61 MIL/uL — ABNORMAL LOW (ref 3.87–5.11)
RDW: 15.5 % (ref 11.5–15.5)
WBC: 16.6 10*3/uL — ABNORMAL HIGH (ref 4.0–10.5)

## 2013-02-10 LAB — PHOSPHORUS: Phosphorus: 4.1 mg/dL (ref 2.3–4.6)

## 2013-02-10 LAB — MAGNESIUM: Magnesium: 1.4 mg/dL — ABNORMAL LOW (ref 1.5–2.5)

## 2013-02-10 LAB — CULTURE, RESPIRATORY

## 2013-02-10 LAB — TRIGLYCERIDES: Triglycerides: 274 mg/dL — ABNORMAL HIGH (ref <150)

## 2013-02-10 MED ORDER — INSULIN ASPART 100 UNIT/ML ~~LOC~~ SOLN
0.0000 [IU] | Freq: Three times a day (TID) | SUBCUTANEOUS | Status: DC
  Administered 2013-02-10: 3 [IU] via SUBCUTANEOUS
  Administered 2013-02-11 – 2013-02-12 (×4): 4 [IU] via SUBCUTANEOUS

## 2013-02-10 MED ORDER — PANTOPRAZOLE SODIUM 40 MG PO TBEC
40.0000 mg | DELAYED_RELEASE_TABLET | Freq: Every day | ORAL | Status: DC
  Administered 2013-02-10 – 2013-02-12 (×3): 40 mg via ORAL
  Filled 2013-02-10 (×3): qty 1

## 2013-02-10 MED ORDER — ALBUTEROL SULFATE (2.5 MG/3ML) 0.083% IN NEBU
2.5000 mg | INHALATION_SOLUTION | Freq: Four times a day (QID) | RESPIRATORY_TRACT | Status: DC
  Administered 2013-02-10 (×2): 2.5 mg via RESPIRATORY_TRACT
  Filled 2013-02-10 (×2): qty 3

## 2013-02-10 MED ORDER — INSULIN ASPART 100 UNIT/ML ~~LOC~~ SOLN: 0.0000 [IU] | Freq: Every day | SUBCUTANEOUS | Status: DC

## 2013-02-10 MED ORDER — SODIUM CHLORIDE 0.9 % IV SOLN
6.0000 g | Freq: Once | INTRAVENOUS | Status: AC
  Administered 2013-02-10: 6 g via INTRAVENOUS
  Filled 2013-02-10: qty 12

## 2013-02-10 MED ORDER — ATORVASTATIN CALCIUM 40 MG PO TABS
40.0000 mg | ORAL_TABLET | Freq: Every day | ORAL | Status: DC
  Administered 2013-02-10 – 2013-02-11 (×2): 40 mg via ORAL
  Filled 2013-02-10 (×3): qty 1

## 2013-02-10 MED ORDER — IPRATROPIUM BROMIDE 0.02 % IN SOLN
0.5000 mg | Freq: Four times a day (QID) | RESPIRATORY_TRACT | Status: DC
  Administered 2013-02-10 (×2): 0.5 mg via RESPIRATORY_TRACT
  Filled 2013-02-10 (×2): qty 2.5

## 2013-02-10 MED ORDER — ALBUTEROL SULFATE (2.5 MG/3ML) 0.083% IN NEBU: 2.5000 mg | INHALATION_SOLUTION | RESPIRATORY_TRACT | Status: DC | PRN

## 2013-02-10 MED ORDER — IPRATROPIUM-ALBUTEROL 0.5-2.5 (3) MG/3ML IN SOLN
3.0000 mL | Freq: Four times a day (QID) | RESPIRATORY_TRACT | Status: DC
  Administered 2013-02-10 – 2013-02-11 (×2): 3 mL via RESPIRATORY_TRACT
  Filled 2013-02-10 (×2): qty 3

## 2013-02-10 MED ORDER — ALPRAZOLAM 0.5 MG PO TABS
0.5000 mg | ORAL_TABLET | Freq: Three times a day (TID) | ORAL | Status: DC | PRN
  Administered 2013-02-10 – 2013-02-12 (×4): 0.5 mg via ORAL
  Filled 2013-02-10 (×4): qty 1

## 2013-02-10 MED ORDER — POTASSIUM CHLORIDE CRYS ER 20 MEQ PO TBCR
20.0000 meq | EXTENDED_RELEASE_TABLET | ORAL | Status: AC
  Administered 2013-02-10 (×2): 20 meq via ORAL
  Filled 2013-02-10 (×2): qty 1

## 2013-02-10 MED ORDER — PANTOPRAZOLE SODIUM 40 MG PO PACK
40.0000 mg | PACK | Freq: Every day | ORAL | Status: DC
  Administered 2013-02-10: 40 mg via ORAL
  Filled 2013-02-10: qty 20

## 2013-02-10 NOTE — Progress Notes (Signed)
Name: Alexandra Richardson MRN: [id number] DOB: Feb 12, 1950    ADMISSION DATE:  02/07/2013  REFERRING MD :  ER  CHIEF COMPLAINT: Altered mental status  BRIEF PATIENT DESCRIPTION:  63 yo female smoker brought to ED after family noted decreased mental status, and had not been seen for 1 week.  Intubated in ER, and PCCM asked to admit to ICU.  SIGNIFICANT EVENTS: 1/01 Admit  STUDIES:   LINES / TUBES: ETT 1/01 >>  CULTURES: Sputum 1/01 >> Blood 1/01 >> strep species >>  Influenza PCR 1/01 >> Pneumococcal ag 1/01 >>POS Legionella Ag 1/01 >>  ANTIBIOTICS: Zosyn 1/01 >> 1/01  Vancomycin 1/01 >> Rocephin 1/01 >> Levaquin 1/01 >> 1/3 Tamiflu 1/01 >> 1/2  SUBJECTIVE:   Has done well post extubation Minimal cough, some wheeze  VITAL SIGNS: Temp:  [97.5 F (36.4 C)-99.2 F (37.3 C)] 99.2 F (37.3 C) (01/04 0744) Pulse Rate:  [91-118] 95 (01/04 0700) Resp:  [19-33] 21 (01/04 0700) BP: (103-137)/(57-83) 125/68 mmHg (01/04 0700) SpO2:  [87 %-97 %] 96 % (01/04 0700) FiO2 (%):  [40 %] 40 % (01/03 1100) Weight:  [89.8 kg (197 lb 15.6 oz)] 89.8 kg (197 lb 15.6 oz) (01/04 0500) HEMODYNAMICS:   VENTILATOR SETTINGS: Vent Mode:  [-]  FiO2 (%):  [40 %] 40 % INTAKE / OUTPUT: Intake/Output     01/03 0701 - 01/04 0700 01/04 0701 - 01/05 0700   P.O. 720    I.V. (mL/kg) 480 (5.3) 40 (0.4)   NG/GT 165    IV Piggyback 500    Total Intake(mL/kg) 1865 (20.8) 40 (0.4)   Urine (mL/kg/hr) 3205 (1.5)    Total Output 3205     Net -1340 +40          PHYSICAL EXAMINATION: General: awake, obese woman Neuro:  A&Ox3, follows commands, moves all ext HEENT:  Pupils reactive, OP clear Cardiovascular:  Regular, no murmur Lungs:  Prolonged exhalation, rales Lt base, scattered rhonchi, no wheezes Abdomen:  Soft, RUQ scar, no masses Musculoskeletal:  No edema Skin:  No rashes  LABS:  CBC  Recent Labs Lab 02/08/13 0324 02/09/13 0410 02/10/13 0345  WBC 23.9* 20.0* 16.6*  HGB 11.2*  10.7* 10.8*  HCT 33.6* 31.7* 31.7*  PLT 196 193 210   Coag's  Recent Labs Lab 02/07/13 2109  APTT 39*  INR 1.48   BMET  Recent Labs Lab 02/09/13 0410 02/09/13 1550 02/10/13 0345  NA 142 138 141  K 3.1* 3.3* 3.5*  CL 105 99 102  CO2 24 27 27   BUN 25* 22 19  CREATININE 0.67 0.65 0.59  GLUCOSE 170* 178* 138*   Electrolytes  Recent Labs Lab 02/08/13 0324 02/09/13 0410 02/09/13 1550 02/10/13 0345  CALCIUM 8.3* 8.5 8.7 8.6  MG 2.2  --   --  1.4*  PHOS 1.9*  --   --  4.1   Sepsis Markers  Recent Labs Lab 02/07/13 1809 02/07/13 2109  LATICACIDVEN 3.7* 1.8  PROCALCITON  --  12.70   ABG  Recent Labs Lab 02/07/13 1940 02/08/13 0445  PHART 7.331* 7.472*  PCO2ART 50.5* 26.3*  PO2ART 187.0* 178.0*   Liver Enzymes  Recent Labs Lab 02/07/13 1809  AST 43*  ALT 24  ALKPHOS 25*  BILITOT 1.4*  ALBUMIN 2.2*   Cardiac Enzymes No results found for this basename: TROPONINI, PROBNP,  in the last 168 hours Glucose  Recent Labs Lab 02/09/13 0746 02/09/13 1152 02/09/13 1557 02/09/13 2015 02/09/13 2318 02/10/13 [id number]  GLUCAP 195* 193* 156* 177* 103* 121*    Imaging Dg Chest Port 1 View  02/09/2013   CLINICAL DATA:  Pneumonia.  EXAM: PORTABLE CHEST - 1 VIEW  COMPARISON:  02/08/2013  FINDINGS: Endotracheal tube is 2.4 cm above the carina. Nasogastric tube extends into the abdomen. There is persistent opacification at the left lung base with hazy densities throughout the left lung. Slightly increased densities in the right lung may represent volume loss. There is no evidence for a pneumothorax. Heart size is grossly stable.  IMPRESSION: Persistent left lung densities are suggestive for pleural fluid and possibly airspace disease.  Support apparatuses as described.   Electronically Signed   By: Richarda Overlie M.D.   On: 02/09/2013 07:58   ASSESSMENT / PLAN:  PULMONARY A: Acute respiratory failure 2nd to pneumococal pneumonia > resolved Hx of COPD with continue  tobacco abuse. P:   -scheduled BD's -will need a stable BD regimen for home, probably start spiriva before d/c -smoking cessation  CARDIOVASCULAR A:  Hypotension after intubation. Hx of HTN, hyperlipidemia. P:  -monitor hemodynamics, pressors weaned to off -restart atorvastatin   RENAL A:   Hypokalemia Hypomagnesemia P:   -monitor renal fx, urine outpt, electrolytes -resumed lasix -potassium replacement  -Mg replaced am 1/4  GASTROINTESTINAL A:   Nutrition. P:   -Protonix for SUP -PO diet  HEMATOLOGIC A:   Leukocytosis. P:  -f/u CBC -SQ heparin for DVT prevention  INFECTIOUS A:   Sepsis from pneumonia, pneumococcal bacteremia P:   -d/c'd levaquin -d/c'd tamiflu -continue ceftriaxone and stop vanco on 1/4; abx day 4 on 1/4  ENDOCRINE A:   Hx of borderline DM with hyperglycemia.   P:   -SSI  NEUROLOGIC A:   Acute encephalopathy 2nd to sepsis, respiratory failure. Hx of chronic pain, anxiety. P:   -continue risperdal 1/2 home dose & titrate -lexapro -start xanax 1/4 (lower dose, her home dose is 2mg  tid prn)    Levy Pupa, MD, PhD 02/10/2013, 9:37 AM [place] [facility] [phone] or if no answer [phone]

## 2013-02-10 NOTE — Progress Notes (Signed)
[facility] FOR [facility] LAB REPLACEMENT ONLY  The patient does apply for the [facility] Electroly[facility] based on the criteria listed below:   1. Is GFR >/= 40 ml/min? yes  Patient's GFR today is >90 2. Is urine output >/= 0.5 ml/kg/hr for the last 6 hours? yes Patient's UOP is 0.7 ml/kg/hr 3. Is BUN < 60 mg/dL? yes  Patient's BUN today is 19 4. Abnormal electrolyte(s): K(3.5), Mg (1.4) 5. Ordered repletion with: [id number]/po,MG/IV/1gm 6. If a panic level lab has been reported, has the CCM MD in charge been notified? yes.   Physician:  Shanda HowellsJ Yacoub,MD  Frederik Standley William 02/10/2013 6:21 AM

## 2013-02-11 DIAGNOSIS — I10 Essential (primary) hypertension: Secondary | ICD-10-CM

## 2013-02-11 DIAGNOSIS — N390 Urinary tract infection, site not specified: Secondary | ICD-10-CM

## 2013-02-11 DIAGNOSIS — E785 Hyperlipidemia, unspecified: Secondary | ICD-10-CM

## 2013-02-11 LAB — BASIC METABOLIC PANEL
BUN: 13 mg/dL (ref 6–23)
CO2: 32 mEq/L (ref 19–32)
Calcium: 8.4 mg/dL (ref 8.4–10.5)
Chloride: 96 mEq/L (ref 96–112)
Creatinine, Ser: 0.5 mg/dL (ref 0.50–1.10)
GFR calc Af Amer: >90 mL/min (ref >90)
GFR calc non Af Amer: >90 mL/min (ref >90)
Glucose, Bld: 116 mg/dL — ABNORMAL HIGH (ref 70–99)
Potassium: 3.2 mEq/L — ABNORMAL LOW (ref 3.7–5.3)
Sodium: 138 mEq/L (ref 137–147)

## 2013-02-11 LAB — GLUCOSE, CAPILLARY
Glucose-Capillary: 141 mg/dL — ABNORMAL HIGH (ref 70–99)
Glucose-Capillary: 197 mg/dL — ABNORMAL HIGH (ref 70–99)
Glucose-Capillary: 152 mg/dL — ABNORMAL HIGH (ref 70–99)
Glucose-Capillary: 138 mg/dL — ABNORMAL HIGH (ref 70–99)

## 2013-02-11 LAB — MAGNESIUM: Magnesium: 1.6 mg/dL (ref 1.5–2.5)

## 2013-02-11 MED ORDER — TIOTROPIUM BROMIDE MONOHYDRATE 18 MCG IN CAPS
18.0000 ug | ORAL_CAPSULE | Freq: Every day | RESPIRATORY_TRACT | Status: DC
  Administered 2013-02-11 – 2013-02-12 (×2): 18 ug via RESPIRATORY_TRACT
  Filled 2013-02-11: qty 5

## 2013-02-11 MED ORDER — POTASSIUM CHLORIDE CRYS ER 20 MEQ PO TBCR
40.0000 meq | EXTENDED_RELEASE_TABLET | ORAL | Status: AC
  Administered 2013-02-11 (×2): 40 meq via ORAL
  Filled 2013-02-11 (×2): qty 2

## 2013-02-11 NOTE — Progress Notes (Signed)
Name: Alexandra BlamerDorothy L Haskew MRN: [id number] DOB: 12/16/1950    ADMISSION DATE:  02/07/2013  REFERRING MD :  ER  CHIEF COMPLAINT: Altered mental status  BRIEF PATIENT DESCRIPTION:  63 yo female smoker brought to ED after family noted decreased mental status, and had not been seen for 1 week.  Intubated in ER, and PCCM asked to admit to ICU.  SIGNIFICANT EVENTS: 1/01 Admit  STUDIES:   LINES / TUBES: ETT 1/01 >> 1/03  CULTURES: Sputum 1/01 >> negative Blood 1/01 >> Pneumococcus Influenza PCR 1/01 >> negative Pneumococcal ag 1/01 >>Positive Legionella Ag 1/01 >> negative Urine 1/01 >> Enterococcus  ANTIBIOTICS: Zosyn 1/01 >> 1/01  Vancomycin 1/01 >> 1/3 Rocephin 1/01 >> Levaquin 1/01 >> 1/3 Tamiflu 1/01 >> 1/2  SUBJECTIVE:   Feels better.  Denies chest pain.  Tolerating diet.  Anxious to go home.  VITAL SIGNS: Temp:  [99 F (37.2 C)-99.7 F (37.6 C)] 99 F (37.2 C) (01/05 0410) Pulse Rate:  [92-115] 92 (01/05 0700) Resp:  [16-35] 27 (01/05 0700) BP: (103-161)/(62-83) 143/66 mmHg (01/05 0600) SpO2:  [91 %-96 %] 95 % (01/05 0742) Weight:  [197 lb 8.5 oz (89.6 kg)] 197 lb 8.5 oz (89.6 kg) (01/05 0500) 3 liters [place]  INTAKE / OUTPUT: Intake/Output     01/04 0701 - 01/05 0700 01/05 0701 - 01/06 0700   P.O. 810    I.V. (mL/kg) 454.8 (5.1)    NG/GT     IV Piggyback 50    Total Intake(mL/kg) 1314.8 (14.7)    Urine (mL/kg/hr) 2330 (1.1)    Total Output 2330     Net -1015.2          Stool Occurrence 1 x      PHYSICAL EXAMINATION: General: awake, obese woman Neuro:  A&Ox3, follows commands, moves all ext HEENT:  Pupils reactive, OP clear Cardiovascular:  Regular, no murmur Lungs:  Prolonged exhalation, rales Lt base, scattered rhonchi, no wheezes Abdomen:  Soft, RUQ scar, no masses Musculoskeletal:  No edema Skin:  No rashes  LABS:  CBC  Recent Labs Lab 02/08/13 0324 02/09/13 0410 02/10/13 0345  WBC 23.9* 20.0* 16.6*  HGB 11.2* 10.7* 10.8*  HCT  33.6* 31.7* 31.7*  PLT 196 193 210   Coag's  Recent Labs Lab 02/07/13 2109  APTT 39*  INR 1.48   BMET  Recent Labs Lab 02/09/13 1550 02/10/13 0345 02/11/13 0355  NA 138 141 138  K 3.3* 3.5* 3.2*  CL 99 102 96  CO2 27 27 32  BUN 22 19 13   CREATININE 0.65 0.59 0.50  GLUCOSE 178* 138* 116*   Electrolytes  Recent Labs Lab 02/08/13 0324  02/09/13 1550 02/10/13 0345 02/11/13 0355  CALCIUM 8.3*  < > 8.7 8.6 8.4  MG 2.2  --   --  1.4* 1.6  PHOS 1.9*  --   --  4.1  --   < > = values in this interval not displayed. Sepsis Markers  Recent Labs Lab 02/07/13 1809 02/07/13 2109  LATICACIDVEN 3.7* 1.8  PROCALCITON  --  12.70   ABG  Recent Labs Lab 02/07/13 1940 02/08/13 0445  PHART 7.331* 7.472*  PCO2ART 50.5* 26.3*  PO2ART 187.0* 178.0*   Liver Enzymes  Recent Labs Lab 02/07/13 1809  AST 43*  ALT 24  ALKPHOS 25*  BILITOT 1.4*  ALBUMIN 2.2*   Glucose  Recent Labs Lab 02/10/13 0405 02/10/13 0742 02/10/13 1251 02/10/13 1611 02/10/13 2215 02/11/13 0745  GLUCAP 131* 121* 132*  117* 136* 141*    Imaging No results found.  ASSESSMENT / PLAN:  PULMONARY A: Acute respiratory failure 2nd to pneumococal pneumonia >> successfully extubated. Hx of COPD with continue tobacco abuse. P:   -start spiriva 1/5 -prn neb's -smoking cessation  CARDIOVASCULAR A:  Hypotension after intubation >> resolved. Hx of HTN, hyperlipidemia. P:  -monitor hemodynamics, pressors weaned to off -restarted atorvastatin   RENAL A:   Hypokalemia Hypomagnesemia P:   -monitor renal fx, urine outpt, electrolytes -resumed lasix  GASTROINTESTINAL A:   Nutrition. P:   -Protonix for SUP -PO diet  HEMATOLOGIC A:   Leukocytosis. P:  -f/u CBC -SQ heparin for DVT prevention  INFECTIOUS A:   Sepsis from pneumonia, pneumococcal bacteremia. Enterococcal UTI. P:   -Day 5 of Abx, currently on ceftriaxone  ENDOCRINE A:   Hx of borderline DM with  hyperglycemia.   P:   -SSI  NEUROLOGIC A:   Acute encephalopathy 2nd to sepsis, respiratory failure >> resolved. Hx of chronic pain, anxiety. Deconditioning. P:   -continue risperdal 1/2 home dose & titrate -lexapro -start xanax 1/4 (lower dose, her home dose is 2mg  tid prn) -PT consult  Transfer to floor.  May be ready for transition to oral abx and d/c home in next 24 to 48 hrs.  Keep on PCCM service.  Updated family at bedside.  Coralyn Helling, MD [facility] 02/11/2013, 9:29 AM Pager:  [phone] After 3pm call: [phone]

## 2013-02-11 NOTE — Progress Notes (Signed)
eLink Physician-Brief Progress Note Patient Name: Alexandra BlamerDorothy L Richardson DOB: Apr 05, 1950 MRN: [id number]  Date of Service  02/11/2013   HPI/Events of Note  Hypokalemia   eICU Interventions  Potassium replaced   Intervention Category Intermediate Interventions: Electrolyte abnormality - evaluation and management  DETERDING,ELIZABETH 02/11/2013, 6:14 AM

## 2013-02-11 NOTE — Evaluation (Signed)
Physical Therapy Evaluation Patient Details Name: Alexandra BlamerDorothy L Askin MRN: [id number] DOB: 1950-05-24 Today's Date: 02/11/2013 Time: [id number] PT Time Calculation (min): 18 min  PT Assessment / Plan / Recommendation History of Present Illness  63 yo female smoker brought to ED after family noted decreased mental status, and had not been seen for 1 week.  Intubated in ER, and PCCM asked to admit to ICU.  Clinical Impression  Pt with generalized deconditioning. Pt mild unsteady requiring use of RW for safe ambulation and energy conservation. Pt most limited by SOB and onset of fatigue. Pt now requiring O2 and resting at 88-90% on 4LO2 via [place]. Pt currently requires 24/7 supervision for safe d/c home however suspect pt to progress to mod I level by the time of d/c.    PT Assessment  Patient needs continued PT services    Follow Up Recommendations  No PT follow up;Supervision/Assistance - 24 hour (may neeed HHPT if doesn't progress to mod I)    Does the patient have the potential to tolerate intense rehabilitation      Barriers to Discharge        Equipment Recommendations  None recommended by PT    Recommendations for Other Services     Frequency Min 3X/week    Precautions / Restrictions Precautions Precautions: Fall Restrictions Weight Bearing Restrictions: No   Pertinent Vitals/Pain Denies pain      Mobility  Bed Mobility Bed Mobility:  (pt received sitting up in chair) Transfers Transfers: Sit to Stand;Stand to Sit Sit to Stand: 4: Min guard;With upper extremity assist;With armrests;From chair/3-in-1 Stand to Sit: 4: Min guard;With upper extremity assist;To bed Details for Transfer Assistance: slow Ambulation/Gait Ambulation/Gait Assistance: 4: Min assist Ambulation Distance (Feet): 75 Feet Assistive device: Rolling walker Ambulation/Gait Assistance Details: v/c's for walker management. pt SPO2 at 86% on 4LO2 via [place], RN increased O2 to 6LO2 and SPO2 inc to 88-90%. Gait  Pattern: Step-through pattern;Decreased stride length Gait velocity: slow General Gait Details: + SOB Stairs: No    Exercises     PT Diagnosis: Generalized weakness  PT Problem List: Decreased strength;Decreased activity tolerance;Decreased balance;Decreased mobility PT Treatment Interventions: DME instruction;Gait training;Stair training;Functional mobility training;Therapeutic activities;Therapeutic exercise     PT Goals(Current goals can be found in the care plan section) Acute Rehab PT Goals Patient Stated Goal: home PT Goal Formulation: With patient Time For Goal Achievement: 02/25/13 Potential to Achieve Goals: Good  Visit Information  Last PT Received On: 02/11/13 Assistance Needed: +1 History of Present Illness: 63 yo female smoker brought to ED after family noted decreased mental status, and had not been seen for 1 week.  Intubated in ER, and PCCM asked to admit to ICU.       Prior Functioning  Home Living Family/patient expects to be discharged to:: Private residence Living Arrangements: Alone Available Help at Discharge: Family;Available PRN/intermittently Type of Home: Mobile home Home Access: Stairs to enter Entrance Stairs-Number of Steps: 5 Entrance Stairs-Rails: Right Home Layout: One level Home Equipment: Walker - 2 wheels Prior Function Level of Independence: Independent Communication Communication: No difficulties Dominant Hand: Right    Cognition  Cognition Arousal/Alertness: Awake/alert Behavior During Therapy: WFL for tasks assessed/performed Overall Cognitive Status: Within Functional Limits for tasks assessed    Extremity/Trunk Assessment Upper Extremity Assessment Upper Extremity Assessment: Generalized weakness Lower Extremity Assessment Lower Extremity Assessment: Generalized weakness Cervical / Trunk Assessment Cervical / Trunk Assessment: Normal   Balance    End of Session PT - End of Session  Equipment Utilized During Treatment:  Gait belt;Oxygen Activity Tolerance: Patient limited by fatigue Patient left: in chair;with call bell/phone within reach Nurse Communication: Mobility status  GP     Marcene Brawn 02/11/2013, 4:50 PM  Lewis Shock, PT, DPT Pager #: [phone] Office #: [phone]

## 2013-02-12 ENCOUNTER — Inpatient Hospital Stay (HOSPITAL_COMMUNITY): Payer: Medicare Other

## 2013-02-12 DIAGNOSIS — F3289 Other specified depressive episodes: Secondary | ICD-10-CM

## 2013-02-12 DIAGNOSIS — F329 Major depressive disorder, single episode, unspecified: Secondary | ICD-10-CM

## 2013-02-12 LAB — BASIC METABOLIC PANEL
BUN: 11 mg/dL (ref 6–23)
CO2: 31 mEq/L (ref 19–32)
Calcium: 8.5 mg/dL (ref 8.4–10.5)
Chloride: 94 mEq/L — ABNORMAL LOW (ref 96–112)
Creatinine, Ser: 0.5 mg/dL (ref 0.50–1.10)
GFR calc Af Amer: >90 mL/min (ref >90)
GFR calc non Af Amer: >90 mL/min (ref >90)
Glucose, Bld: 130 mg/dL — ABNORMAL HIGH (ref 70–99)
Potassium: 3.7 mEq/L (ref 3.7–5.3)
Sodium: 136 mEq/L — ABNORMAL LOW (ref 137–147)

## 2013-02-12 LAB — MAGNESIUM: Magnesium: 1.4 mg/dL — ABNORMAL LOW (ref 1.5–2.5)

## 2013-02-12 LAB — GLUCOSE, CAPILLARY
Glucose-Capillary: 154 mg/dL — ABNORMAL HIGH (ref 70–99)
Glucose-Capillary: 153 mg/dL — ABNORMAL HIGH (ref 70–99)

## 2013-02-12 LAB — CBC
HCT: 33.5 % — ABNORMAL LOW (ref 36.0–46.0)
Hemoglobin: 11.1 g/dL — ABNORMAL LOW (ref 12.0–15.0)
MCH: 29.6 pg (ref 26.0–34.0)
MCHC: 33.1 g/dL (ref 30.0–36.0)
MCV: 89.3 fL (ref 78.0–100.0)
Platelets: 255 10*3/uL (ref 150–400)
RBC: 3.75 MIL/uL — ABNORMAL LOW (ref 3.87–5.11)
RDW: 15.5 % (ref 11.5–15.5)
WBC: 14.5 10*3/uL — ABNORMAL HIGH (ref 4.0–10.5)

## 2013-02-12 MED ORDER — RISPERIDONE 1 MG PO TABS: 1.0000 mg | ORAL_TABLET | Freq: Two times a day (BID) | ORAL | Status: DC

## 2013-02-12 MED ORDER — AMOXICILLIN 500 MG PO CAPS: 500.0000 mg | ORAL_CAPSULE | Freq: Three times a day (TID) | ORAL | Status: AC

## 2013-02-12 MED ORDER — ALBUTEROL SULFATE HFA 108 (90 BASE) MCG/ACT IN AERS: 2.0000 | INHALATION_SPRAY | Freq: Four times a day (QID) | RESPIRATORY_TRACT | Status: DC | PRN

## 2013-02-12 MED ORDER — ALBUTEROL SULFATE (2.5 MG/3ML) 0.083% IN NEBU: 2.5000 mg | INHALATION_SOLUTION | RESPIRATORY_TRACT | Status: DC | PRN

## 2013-02-12 MED ORDER — AMOXICILLIN 500 MG PO CAPS
500.0000 mg | ORAL_CAPSULE | Freq: Three times a day (TID) | ORAL | Status: DC
  Administered 2013-02-12: 500 mg via ORAL
  Filled 2013-02-12 (×3): qty 1

## 2013-02-12 MED ORDER — TIOTROPIUM BROMIDE MONOHYDRATE 18 MCG IN CAPS: 18.0000 ug | ORAL_CAPSULE | Freq: Every day | RESPIRATORY_TRACT | Status: AC

## 2013-02-12 NOTE — Progress Notes (Signed)
Pt ready for discharge. IV D/C'd. Discharge instructions reviewed with patient and patient's daughter.  Answered all questions. Patient care plan and education completed in chart.

## 2013-02-12 NOTE — Progress Notes (Signed)
SATURATION QUALIFICATIONS: (This note is used to comply with regulatory documentation for home oxygen)  Patient Saturations on Room Air at Rest = 87%  Patient Saturations on Room Air while Ambulating = 88%  Patient Saturations on 3Liters of oxygen while Ambulating = 92%  Please briefly explain why patient needs home oxygen:

## 2013-02-12 NOTE — Progress Notes (Signed)
Prior to ambulation the patients O2 sats were greater than 92% on 2L [place]. While ambulating the patient continued to sat in the low 90's on 2L [place]. During ambulation oxygen was removed and the patient O2 sats dropped to 88%. Oxygen was reapplied at 3L [place] and the patients O2 sats increased to greater than 92%. Will continue to monitor.

## 2013-02-12 NOTE — Plan of Care (Signed)
Problem: Discharge Progression Outcomes Goal: O2 sats at patient's baseline Outcome: Adequate for Discharge Going home on home oxygen

## 2013-02-12 NOTE — Discharge Summary (Signed)
Physician Discharge Summary  Patient ID: ALETTA EDMUNDS MRN: [id number] DOB/AGE: 1950-02-21 63 y.o.  Admit date: 02/07/2013 Discharge date: 02/12/2013    Discharge Diagnoses:  Acute respiratory failure 2nd to pneumococal pneumonia  Hx of COPD with continue tobacco abuse Hypotension after intubation >> resolved.  Hx of HTN, hyperlipidemia. Hypokalemia, Hypomagnesemia >> resolved Abdominal bloating 1/6 >> abd exam benign. Sepsis from pneumonia, pneumococcal bacteremia.  Enterococcal UTI Hx of borderline DM with hyperglycemia Acute encephalopathy 2nd to sepsis, respiratory failure >> resolved.  Hx of chronic pain, anxiety.  Deconditioning.                                                                      DISCHARGE PLAN BY DIAGNOSIS    Acute respiratory failure 2nd to pneumococal pneumonia >> successfully extubated.  Hx of COPD with continue tobacco abuse.   Discharge Plan: -started spiriva 1/5 >> continue as outpt  -d/c home on 3L O2 continuous, educated patient regarding smoking with O2 risks -prn albuterol >>arranged for home nebulizer  -smoking cessation  -follow up in pulmonary office in one week with CXR   Hypotension after intubation >> resolved.  Hx of HTN, hyperlipidemia.   Discharge Plan:  -continue atorvastatin, lasix  -assess blood pressure as outpt   Hypokalemia, Hypomagnesemia >> resolved.   Discharge Plan: -f/u electrolytes as outpt   Abdominal bloating 1/6 >> abd exam benign.   Discharge Plan: -discussed with pt symptoms to monitor for with regard to risk for C diff  -resume prilosec  Sepsis from pneumonia, pneumococcal bacteremia.  Enterococcal UTI.   Discharge Plan: -Day 6/10 of Abx >> changed to amoxicillin 1/6    Hx of borderline DM with hyperglycemia.   Discharge Plan: -re-assess as outpt  -does not need home insulin   Acute encephalopathy 2nd to sepsis, respiratory failure >> resolved.  Hx of chronic pain, anxiety.   Deconditioning.   Discharge Plan: -continue risperdal at 1 mg bid >> adjust up as needed as outpt  -continue lexapro  -continue xanax 2 mg tid prn as outpt >> pt's home regimen  -pt instructed to hold home oxycodone, percocet                  DISCHARGE SUMMARY   ROMINA DIVIRGILIO is a 63 y.o. y/o female, current smoker with a PMH of HTN, HLD, Depression / Anxiety & COPD who presented to [facility] on 1/1 with altered mental status.  She was last seen by her family around Christmas.  They were concerned about not hearing from here and proceeded to check on her.  Upon arrival to home, patient was able to answer the door but was not "acting herself" and had trouble recognizing her family. She reported episodes of nausea / vomiting and cough with sputum production to family.  They proceeded to ER where she was found to have fever of 101,  CXR with LLL infiltrate and hypoxia.  As a result, she required intubation in ER. She was pan cultured which demonstrated blood cultures positive for pneumococcus and urine positive for enterococcus.  She was treated with ABX as below for infection.  At discharge, she will transition to amoxicillin for a total of 10 days abx.  Patient remained  on mechanical ventilation until 1/3 at which time she was successfully liberated from the ventilator.  Patient made slow progressive recovery and will be discharged with plan as above.    SIGNIFICANT EVENTS:  1/01 Admit decreased mental status, intubated  LINES / TUBES:  ETT 1/01 >> 1/03   CULTURES:  Sputum 1/01 >> negative  Blood 1/01 >> Pneumococcus  Influenza PCR 1/01 >> negative  Pneumococcal ag 1/01 >>Positive  Legionella Ag 1/01 >> negative  Urine 1/01 >> Enterococcus   ANTIBIOTICS:  Zosyn 1/01 >> 1/01  Vancomycin 1/01 >> 1/3  Rocephin 1/01 >> 1/6  Levaquin 1/01 >> 1/3  Tamiflu 1/01 >> 1/2  Amoxicillin 1/6 >>     Discharge Exam: General: awake, obese woman  Neuro: A&Ox3, follows  commands, moves all ext  HEENT: Pupils reactive, OP clear  Cardiovascular: Regular, no murmur  Lungs: Prolonged exhalation, rales Lt base, scattered rhonchi, no wheezes  Abdomen: Soft, RUQ scar, no masses  Musculoskeletal: No edema  Skin: No rashes    Filed Vitals:   02/12/13 0725 02/12/13 0756 02/12/13 0800 02/12/13 0900  BP:  132/76 144/82 135/65  Pulse:  106 106 107  Temp:   97.7 F (36.5 C)   TempSrc:   Oral   Resp:  27 33 25  Height:      Weight:      SpO2: 94% 93% 91% 94%     Discharge Labs  BMET  Recent Labs Lab 02/08/13 0324 02/09/13 0410 02/09/13 1550 02/10/13 0345 02/11/13 0355 02/12/13 0415  NA 137 142 138 141 138 136*  K 3.4* 3.1* 3.3* 3.5* 3.2* 3.7  CL 102 105 99 102 96 94*  CO2 22 24 27 27  32 31  GLUCOSE 157* 170* 178* 138* 116* 130*  BUN 29* 25* 22 19 13 11   CREATININE 0.78 0.67 0.65 0.59 0.50 0.50  CALCIUM 8.3* 8.5 8.7 8.6 8.4 8.5  MG 2.2  --   --  1.4* 1.6 1.4*  PHOS 1.9*  --   --  4.1  --   --    CBC  Recent Labs Lab 02/09/13 0410 02/10/13 0345 02/12/13 0415  HGB 10.7* 10.8* 11.1*  HCT 31.7* 31.7* 33.5*  WBC 20.0* 16.6* 14.5*  PLT 193 210 255   Anti-Coagulation  Recent Labs Lab 02/07/13 2109  INR 1.48      Medication List    ASK your doctor about these medications       albuterol 108 (90 BASE) MCG/ACT inhaler  Commonly known as:  PROVENTIL HFA;VENTOLIN HFA  Inhale 2 puffs into the lungs every 6 (six) hours as needed for shortness of breath. For shortness of breath.     alprazolam 2 MG tablet  Commonly known as:  XANAX  Take 2 mg by mouth 3 (three) times daily as needed for anxiety. For anxiety.     atorvastatin 40 MG tablet  Commonly known as:  LIPITOR  Take 40 mg by mouth daily.     escitalopram 20 MG tablet  Commonly known as:  LEXAPRO  Take 40 mg by mouth daily.     furosemide 40 MG tablet  Commonly known as:  LASIX  Take 40 mg by mouth daily.     omeprazole 20 MG capsule  Commonly known as:  PRILOSEC   Take 20 mg by mouth daily.     oxyCODONE 15 MG immediate release tablet  Commonly known as:  ROXICODONE  Take 15 mg by mouth every 6 (six) hours as needed  for pain.     PERCOCET PO  Take 1 tablet by mouth every 6 (six) hours as needed. For pain.     risperiDONE 2 MG tablet  Commonly known as:  RISPERDAL  Take 2 mg by mouth 2 (two) times daily.       Follow-up Information   Follow up with PARRETT,TAMMY, NP On 02/22/2013. (Appt at 3:15 - arrive at 2:45 for labs and chest xray)    Specialty:  Nurse Practitioner   Contact information:   [address] [place] [address] [phone]       Follow up with [facility], MD. Schedule an appointment as soon as possible for a visit in 1 week.   Specialty:  Family Medicine   Contact information:   Dr. Daiva Eves [place] [address] [phone]         Disposition: Home with home PT, RN and oxygen at 3L per [place] continuously  Discharged Condition: GILBERT NARAIN has met maximum benefit of inpatient care and is medically stable and cleared for discharge.  Patient is pending follow up as above.      Time spent on disposition:  Greater than 35 minutes.   Signed: Noe Gens, NP-C [facility] Pgr: [phone] Office: [place], [place] 02/12/2013, 11:41 AM Pager:  [phone] After 3pm call: [phone]

## 2013-02-12 NOTE — Progress Notes (Signed)
Name: Alexandra Richardson MRN: [id number] DOB: 1951-01-26    ADMISSION DATE:  02/07/2013  REFERRING MD :  ER  CHIEF COMPLAINT: Altered mental status  BRIEF PATIENT DESCRIPTION:  63 yo female smoker brought to ED after family noted decreased mental status, and had not been seen for 1 week.  Intubated in ER, and PCCM asked to admit to ICU.  SIGNIFICANT EVENTS: 1/01 Admit  STUDIES:   LINES / TUBES: ETT 1/01 >> 1/03  CULTURES: Sputum 1/01 >> negative Blood 1/01 >> Pneumococcus Influenza PCR 1/01 >> negative Pneumococcal ag 1/01 >>Positive Legionella Ag 1/01 >> negative Urine 1/01 >> Enterococcus  ANTIBIOTICS: Zosyn 1/01 >> 1/01  Vancomycin 1/01 >> 1/3 Rocephin 1/01 >> 1/6 Levaquin 1/01 >> 1/3 Tamiflu 1/01 >> 1/2 Amoxicillin 1/6 >>  SUBJECTIVE:   Denies chest pain.  Breathing better.  Not much cough.  C/o mild bloating >> better after BM this AM.  VITAL SIGNS: Temp:  [97.7 F (36.5 C)-99.2 F (37.3 C)] 97.7 F (36.5 C) (01/06 0800) Pulse Rate:  [97-110] 107 (01/06 0900) Resp:  [17-33] 25 (01/06 0900) BP: (132-154)/(63-83) 135/65 mmHg (01/06 0900) SpO2:  [91 %-95 %] 94 % (01/06 0900) Weight:  [191 lb 2.2 oz (86.7 kg)] 191 lb 2.2 oz (86.7 kg) (01/06 0300) 3 liters   INTAKE / OUTPUT: Intake/Output     01/05 0701 - 01/06 0700 01/06 0701 - 01/07 0700   P.O. 870 118   I.V. (mL/kg)     IV Piggyback 50    Total Intake(mL/kg) 920 (10.6) 118 (1.4)   Urine (mL/kg/hr) 400 (0.2)    Total Output 400     Net +520 +118        Urine Occurrence 8 x 2 x   Stool Occurrence 8 x 1 x     PHYSICAL EXAMINATION: General: awake, obese woman Neuro:  A&Ox3, follows commands, moves all ext HEENT:  Pupils reactive, OP clear Cardiovascular:  Regular, no murmur Lungs:  Prolonged exhalation, rales Lt base, scattered rhonchi, no wheezes Abdomen:  Soft, RUQ scar, no masses Musculoskeletal:  No edema Skin:  No rashes  LABS:  CBC  Recent Labs Lab 02/09/13 0410 02/10/13 0345  02/12/13 0415  WBC 20.0* 16.6* 14.5*  HGB 10.7* 10.8* 11.1*  HCT 31.7* 31.7* 33.5*  PLT 193 210 255   Coag's  Recent Labs Lab 02/07/13 2109  APTT 39*  INR 1.48   BMET  Recent Labs Lab 02/10/13 0345 02/11/13 0355 02/12/13 0415  NA 141 138 136*  K 3.5* 3.2* 3.7  CL 102 96 94*  CO2 27 32 31  BUN 19 13 11   CREATININE 0.59 0.50 0.50  GLUCOSE 138* 116* 130*   Electrolytes  Recent Labs Lab 02/08/13 0324  02/10/13 0345 02/11/13 0355 02/12/13 0415  CALCIUM 8.3*  < > 8.6 8.4 8.5  MG 2.2  --  1.4* 1.6 1.4*  PHOS 1.9*  --  4.1  --   --   < > = values in this interval not displayed. Sepsis Markers  Recent Labs Lab 02/07/13 1809 02/07/13 2109  LATICACIDVEN 3.7* 1.8  PROCALCITON  --  12.70   ABG  Recent Labs Lab 02/07/13 1940 02/08/13 0445  PHART 7.331* 7.472*  PCO2ART 50.5* 26.3*  PO2ART 187.0* 178.0*   Liver Enzymes  Recent Labs Lab 02/07/13 1809  AST 43*  ALT 24  ALKPHOS 25*  BILITOT 1.4*  ALBUMIN 2.2*   Glucose  Recent Labs Lab 02/10/13 2215 02/11/13 0745 02/11/13 1201  02/11/13 1732 02/11/13 2157 02/12/13 0818  GLUCAP 136* 141* 197* 152* 138* 154*    Imaging Dg Chest 2 View  02/12/2013   CLINICAL DATA:  Cough.  EXAM: CHEST  2 VIEW  COMPARISON:  02/09/2013.  FINDINGS: Interim removal of endotracheal tube and NG tube. Mediastinum and hilar structures are normal. Left lower lobe infiltrate noted consistent pneumonia. Possible mild right apical pulmonary infiltrate is present. Small left pleural effusion is present. No pneumothorax. Stable cardiomegaly.  IMPRESSION: 1. Left lower lobe infiltrate consistent with pneumonia. Small left pleural effusion . 2. Subtle right apical pulmonary infiltrate cannot be excluded. 3. Interim removal of endotracheal and NG tube.   Electronically Signed   By: Maisie Fushomas  Register   On: 02/12/2013 07:44    ASSESSMENT / PLAN:  A: Acute respiratory failure 2nd to pneumococal pneumonia >> successfully extubated. Hx  of COPD with continue tobacco abuse. P:   -started spiriva 1/5 >> continue as outpt -assess for home oxygen prior to discharge -prn albuterol >> will need to arrange for home nebulizer -smoking cessation  A:  Hypotension after intubation >> resolved. Hx of HTN, hyperlipidemia. P:  -restarted atorvastatin, lasix -assess blood pressure as outpt  A:   Hypokalemia, Hypomagnesemia >> resolved. P:   -f/u electrolytes as outpt  A: Abdominal bloating 1/6 >> abd exam benign. P: -discussed with pt symptoms to monitor for with regard to risk for C diff  A:   Sepsis from pneumonia, pneumococcal bacteremia. Enterococcal UTI. P:   -Day 6/10 of Abx >> changed to amoxicillin 1/6  A:   Hx of borderline DM with hyperglycemia.   P:   -re-assess as outpt  -does not need home insulin  A:   Acute encephalopathy 2nd to sepsis, respiratory failure >> resolved. Hx of chronic pain, anxiety. Deconditioning. P:   -continue risperdal at 1 mg bid >> adjust up as needed as outpt -continue lexapro -continue xanax 2 mg tid prn as outpt >> pt's home regimen  Summary: Ready for d/c home after assessing for home oxygen set up.  She will need f/u in pulmonary office in one week with chest xray.  Coralyn HellingVineet Eliyas Suddreth, MD [facility] 02/12/2013, 9:54 AM Pager:  [phone] After 3pm call: [phone]

## 2013-02-12 NOTE — Care Management (Signed)
Pt discharging home today with HHRN/PT needs as well as needing a rolling walker, home oxygen and a nebulizer. Pt has chosen [facility] and D[facility] needs.  The address and phone number listed in EPIC is correct.

## 2013-02-14 LAB — CULTURE, BLOOD (ROUTINE X 2): Culture: NO GROWTH

## 2013-02-22 ENCOUNTER — Encounter: Payer: Self-pay | Admitting: Adult Health

## 2013-02-22 ENCOUNTER — Ambulatory Visit (INDEPENDENT_AMBULATORY_CARE_PROVIDER_SITE_OTHER)
Admission: RE | Admit: 2013-02-22 | Discharge: 2013-02-22 | Disposition: A | Discharge status: Home / Self Care | Payer: Medicare Other | Source: Ambulatory Visit | Attending: Adult Health | Admitting: Adult Health

## 2013-02-22 ENCOUNTER — Ambulatory Visit (INDEPENDENT_AMBULATORY_CARE_PROVIDER_SITE_OTHER): Payer: Medicare Other | Admitting: Adult Health

## 2013-02-22 ENCOUNTER — Other Ambulatory Visit (INDEPENDENT_AMBULATORY_CARE_PROVIDER_SITE_OTHER): Payer: Medicare Other

## 2013-02-22 VITALS — BP 124/68 | HR 93 | Temp 98.4°F | Ht 63.5 in | Wt 192.0 lb

## 2013-02-22 DIAGNOSIS — J189 Pneumonia, unspecified organism: Secondary | ICD-10-CM

## 2013-02-22 LAB — BASIC METABOLIC PANEL
BUN: 8 mg/dL (ref 6–23)
CO2: 33 mEq/L — ABNORMAL HIGH (ref 19–32)
Calcium: 9.1 mg/dL (ref 8.4–10.5)
Chloride: 95 mEq/L — ABNORMAL LOW (ref 96–112)
Creatinine, Ser: 0.9 mg/dL (ref 0.4–1.2)
GFR: 69.53 mL/min (ref >60.00)
Glucose, Bld: 123 mg/dL — ABNORMAL HIGH (ref 70–99)
Potassium: 3.6 mEq/L (ref 3.5–5.1)
Sodium: 136 mEq/L (ref 135–145)

## 2013-02-22 NOTE — Patient Instructions (Signed)
Great job on not smoking  Continue on Spiriva  1 puff daily  Wear Oxygen 3l/m at all times.  Follow up Dr. Craige CottaSood  In 2-3 weeks with chest xray and PFT . Please contact office for sooner follow up if symptoms do not improve or worsen or seek emergency care

## 2013-03-01 NOTE — Assessment & Plan Note (Addendum)
Recent hospitalization for severe PNA complicated by VDRF, UTI and sepsis, now clinically improving.  abx course finished ,  Will need serial cxr until clear  Will need PFT for COPD staging.  Smoking cessation key  Plan  Great job on not smoking  Continue on Spiriva  1 puff daily  Wear Oxygen 3l/m at all times.  Follow up Dr. Craige CottaSood  In 2-3 weeks with chest xray and PFT . Please contact office for sooner follow up if symptoms do not improve or worsen or seek emergency care

## 2013-03-01 NOTE — Progress Notes (Signed)
   Subjective:    Patient ID: Alexandra Richardson, female    DOB: December 30, 1950, 63 y.o.   MRN: [id number]  HPI 63 yo admited 02/07/13 for VDRF , PNA, UTI and sepsis by PCCM. Smoker , quit 02/07/13   02/22/13 Post Hospital follow up  Admitted 1/1-1/6 for acute RF w/ PNA , VDRF , UTI  And sepsis. Pan cx showed  blood cultures positive for pneumococcus and urine positive for enterococcus. She was tx w/ aggressive abx, nebs . Did require vent support briefly. Since discharge she is feeling better but still weak.  Reports breathing is doing well.  does report some lingering fatigue.  no new complaints. No chest pain , orthopnea, edema or n/v/d.    Review of Systems Constitutional:   No  weight loss, night sweats,  Fevers, chills,  +fatigue, or  lassitude.  HEENT:   No headaches,  Difficulty swallowing,  Tooth/dental problems, or  Sore throat,                No sneezing, itching, ear ache, nasal congestion, post nasal drip,   CV:  No chest pain,  Orthopnea, PND, swelling in lower extremities, anasarca, dizziness, palpitations, syncope.   GI  No heartburn, indigestion, abdominal pain, nausea, vomiting, diarrhea, change in bowel habits, loss of appetite, bloody stools.   Resp   No chest wall deformity  Skin: no rash or lesions.  GU: no dysuria, change in color of urine, no urgency or frequency.  No flank pain, no hematuria   MS:  No joint pain or swelling.  No decreased range of motion.  No back pain.  Psych:  No change in mood or affect. No depression or anxiety.  No memory loss.          Objective:   Physical Exam  GEN: A/Ox3; pleasant , NAD, elderly   HEENT:  /AT,  EACs-clear, TMs-wnl, NOSE-clear, THROAT-clear, no lesions, no postnasal drip or exudate noted.   NECK:  Supple w/ fair ROM; no JVD; normal carotid impulses w/o bruits; no thyromegaly or nodules palpated; no lymphadenopathy.  RESP  Clear  P & A; w/o, wheezes/ rales/ or rhonchi.no accessory muscle use, no dullness to  percussion  CARD:  RRR, no m/r/g  , no peripheral edema, pulses intact, no cyanosis or clubbing.  GI:   Soft & nt; nml bowel sounds; no organomegaly or masses detected.  Musco: Warm bil, no deformities or joint swelling noted.   Neuro: alert, no focal deficits noted.    Skin: Warm, no lesions or rashes   CXR 02/22/13  CXR LLL infiltrate, unchanged      Assessment & Plan:

## 2013-03-06 ENCOUNTER — Telehealth: Payer: Self-pay | Admitting: Pulmonary Disease

## 2013-03-06 NOTE — Telephone Encounter (Signed)
I spoke with the pt and she states that she needs a letter stating when she was in the hospital and what for. She was supposed to be in court when she was in the hospital and needs this in order to be excused from that court date. Please advise if ok to do letter. Pt requests it be mailed to her home, address verified. Thanks. Carron CurieJennifer Castillo, CMA

## 2013-03-07 ENCOUNTER — Encounter: Payer: Self-pay | Admitting: *Deleted

## 2013-03-07 NOTE — Telephone Encounter (Signed)
Letter completed .  PT notified that it will be mailed to her today.

## 2013-03-07 NOTE — Telephone Encounter (Signed)
Yes that is fine  She was very sick

## 2013-03-13 ENCOUNTER — Telehealth: Payer: Self-pay | Admitting: Pulmonary Disease

## 2013-03-13 NOTE — Telephone Encounter (Signed)
I called and spoke with pt. Advised her will mail another letter to her. Nothing further needed

## 2013-03-26 ENCOUNTER — Ambulatory Visit: Payer: Self-pay | Admitting: Pulmonary Disease

## 2013-03-27 ENCOUNTER — Ambulatory Visit: Payer: Self-pay | Admitting: Pulmonary Disease

## 2013-04-22 ENCOUNTER — Telehealth: Payer: Self-pay | Admitting: Pulmonary Disease

## 2013-04-22 NOTE — Telephone Encounter (Signed)
Called and spoke with pt. She reports she is not using her O2 any longer. Advised her when she comes in for her appt 04/29/13 with VS he will re-eval her at that time and decide her O2 needs. She voiced her understanding and needed nothing further

## 2013-04-29 ENCOUNTER — Ambulatory Visit: Payer: Self-pay | Admitting: Pulmonary Disease

## 2013-05-07 ENCOUNTER — Other Ambulatory Visit (HOSPITAL_COMMUNITY): Payer: Self-pay | Admitting: Family Medicine

## 2013-05-07 DIAGNOSIS — Z1231 Encounter for screening mammogram for malignant neoplasm of breast: Secondary | ICD-10-CM

## 2013-05-14 ENCOUNTER — Ambulatory Visit (HOSPITAL_COMMUNITY): Payer: Medicare Other

## 2013-05-14 ENCOUNTER — Ambulatory Visit (HOSPITAL_COMMUNITY): Payer: Self-pay

## 2013-05-23 ENCOUNTER — Ambulatory Visit: Payer: Self-pay | Admitting: Pulmonary Disease

## 2013-05-29 ENCOUNTER — Ambulatory Visit: Payer: Self-pay

## 2013-06-05 ENCOUNTER — Ambulatory Visit: Payer: Self-pay | Admitting: Pulmonary Disease

## 2014-03-12 DIAGNOSIS — M25522 Pain in left elbow: Secondary | ICD-10-CM | POA: Diagnosis not present

## 2014-03-12 DIAGNOSIS — M25561 Pain in right knee: Secondary | ICD-10-CM | POA: Diagnosis not present

## 2014-03-12 DIAGNOSIS — G8929 Other chronic pain: Secondary | ICD-10-CM | POA: Diagnosis not present

## 2014-03-12 DIAGNOSIS — M545 Low back pain: Secondary | ICD-10-CM | POA: Diagnosis not present

## 2014-03-26 DIAGNOSIS — G8929 Other chronic pain: Secondary | ICD-10-CM | POA: Diagnosis not present

## 2014-03-26 DIAGNOSIS — M545 Low back pain: Secondary | ICD-10-CM | POA: Diagnosis not present

## 2014-03-26 DIAGNOSIS — M542 Cervicalgia: Secondary | ICD-10-CM | POA: Diagnosis not present

## 2014-03-26 DIAGNOSIS — M5412 Radiculopathy, cervical region: Secondary | ICD-10-CM | POA: Diagnosis not present

## 2014-04-02 DIAGNOSIS — Z6834 Body mass index (BMI) 34.0-34.9, adult: Secondary | ICD-10-CM | POA: Diagnosis not present

## 2014-04-02 DIAGNOSIS — F319 Bipolar disorder, unspecified: Secondary | ICD-10-CM | POA: Diagnosis not present

## 2014-04-02 DIAGNOSIS — G43909 Migraine, unspecified, not intractable, without status migrainosus: Secondary | ICD-10-CM | POA: Diagnosis not present

## 2014-04-21 DIAGNOSIS — M79604 Pain in right leg: Secondary | ICD-10-CM | POA: Diagnosis not present

## 2014-04-21 DIAGNOSIS — M79662 Pain in left lower leg: Secondary | ICD-10-CM | POA: Diagnosis not present

## 2014-04-21 DIAGNOSIS — M545 Low back pain: Secondary | ICD-10-CM | POA: Diagnosis not present

## 2014-04-21 DIAGNOSIS — G8929 Other chronic pain: Secondary | ICD-10-CM | POA: Diagnosis not present

## 2014-04-21 DIAGNOSIS — M79661 Pain in right lower leg: Secondary | ICD-10-CM | POA: Diagnosis not present

## 2014-04-25 DIAGNOSIS — Z6830 Body mass index (BMI) 30.0-30.9, adult: Secondary | ICD-10-CM | POA: Diagnosis not present

## 2014-04-25 DIAGNOSIS — G8929 Other chronic pain: Secondary | ICD-10-CM | POA: Diagnosis not present

## 2014-04-25 DIAGNOSIS — J309 Allergic rhinitis, unspecified: Secondary | ICD-10-CM | POA: Diagnosis not present

## 2014-04-25 DIAGNOSIS — Z79899 Other long term (current) drug therapy: Secondary | ICD-10-CM | POA: Diagnosis not present

## 2014-07-08 DIAGNOSIS — E782 Mixed hyperlipidemia: Secondary | ICD-10-CM | POA: Diagnosis not present

## 2014-07-08 DIAGNOSIS — F418 Other specified anxiety disorders: Secondary | ICD-10-CM | POA: Diagnosis not present

## 2014-07-08 DIAGNOSIS — E1149 Type 2 diabetes mellitus with other diabetic neurological complication: Secondary | ICD-10-CM | POA: Diagnosis not present

## 2014-07-08 DIAGNOSIS — Z9181 History of falling: Secondary | ICD-10-CM | POA: Diagnosis not present

## 2014-07-18 DIAGNOSIS — F418 Other specified anxiety disorders: Secondary | ICD-10-CM | POA: Diagnosis not present

## 2014-07-18 DIAGNOSIS — G43909 Migraine, unspecified, not intractable, without status migrainosus: Secondary | ICD-10-CM | POA: Diagnosis not present

## 2014-07-18 DIAGNOSIS — Z6832 Body mass index (BMI) 32.0-32.9, adult: Secondary | ICD-10-CM | POA: Diagnosis not present

## 2014-07-18 DIAGNOSIS — G8929 Other chronic pain: Secondary | ICD-10-CM | POA: Diagnosis not present

## 2014-07-30 DIAGNOSIS — R5383 Other fatigue: Secondary | ICD-10-CM | POA: Diagnosis not present

## 2014-07-30 DIAGNOSIS — R600 Localized edema: Secondary | ICD-10-CM | POA: Diagnosis not present

## 2014-07-30 DIAGNOSIS — Z6834 Body mass index (BMI) 34.0-34.9, adult: Secondary | ICD-10-CM | POA: Diagnosis not present

## 2014-11-12 DIAGNOSIS — Z79899 Other long term (current) drug therapy: Secondary | ICD-10-CM | POA: Diagnosis not present

## 2014-11-12 DIAGNOSIS — Z139 Encounter for screening, unspecified: Secondary | ICD-10-CM | POA: Diagnosis not present

## 2014-11-12 DIAGNOSIS — E1149 Type 2 diabetes mellitus with other diabetic neurological complication: Secondary | ICD-10-CM | POA: Diagnosis not present

## 2014-11-12 DIAGNOSIS — F418 Other specified anxiety disorders: Secondary | ICD-10-CM | POA: Diagnosis not present

## 2014-11-12 DIAGNOSIS — E782 Mixed hyperlipidemia: Secondary | ICD-10-CM | POA: Diagnosis not present

## 2014-12-29 DIAGNOSIS — J988 Other specified respiratory disorders: Secondary | ICD-10-CM | POA: Diagnosis not present

## 2014-12-29 DIAGNOSIS — R5383 Other fatigue: Secondary | ICD-10-CM | POA: Diagnosis not present

## 2014-12-29 DIAGNOSIS — Z6832 Body mass index (BMI) 32.0-32.9, adult: Secondary | ICD-10-CM | POA: Diagnosis not present

## 2015-03-25 DIAGNOSIS — E669 Obesity, unspecified: Secondary | ICD-10-CM | POA: Diagnosis not present

## 2015-03-25 DIAGNOSIS — E782 Mixed hyperlipidemia: Secondary | ICD-10-CM | POA: Diagnosis not present

## 2015-03-25 DIAGNOSIS — E1149 Type 2 diabetes mellitus with other diabetic neurological complication: Secondary | ICD-10-CM | POA: Diagnosis not present

## 2015-03-25 DIAGNOSIS — Z79899 Other long term (current) drug therapy: Secondary | ICD-10-CM | POA: Diagnosis not present

## 2015-03-25 DIAGNOSIS — Z87891 Personal history of nicotine dependence: Secondary | ICD-10-CM | POA: Diagnosis not present

## 2015-03-25 DIAGNOSIS — E559 Vitamin D deficiency, unspecified: Secondary | ICD-10-CM | POA: Diagnosis not present

## 2015-03-25 DIAGNOSIS — N183 Chronic kidney disease, stage 3 (moderate): Secondary | ICD-10-CM | POA: Diagnosis not present

## 2015-03-25 DIAGNOSIS — Z1389 Encounter for screening for other disorder: Secondary | ICD-10-CM | POA: Diagnosis not present

## 2015-07-28 DIAGNOSIS — E1149 Type 2 diabetes mellitus with other diabetic neurological complication: Secondary | ICD-10-CM | POA: Diagnosis not present

## 2015-07-28 DIAGNOSIS — R600 Localized edema: Secondary | ICD-10-CM | POA: Diagnosis not present

## 2015-07-28 DIAGNOSIS — Z9181 History of falling: Secondary | ICD-10-CM | POA: Diagnosis not present

## 2015-07-28 DIAGNOSIS — I10 Essential (primary) hypertension: Secondary | ICD-10-CM | POA: Diagnosis not present

## 2015-07-28 DIAGNOSIS — E782 Mixed hyperlipidemia: Secondary | ICD-10-CM | POA: Diagnosis not present

## 2015-11-30 DIAGNOSIS — E559 Vitamin D deficiency, unspecified: Secondary | ICD-10-CM | POA: Diagnosis not present

## 2015-11-30 DIAGNOSIS — J449 Chronic obstructive pulmonary disease, unspecified: Secondary | ICD-10-CM | POA: Diagnosis not present

## 2015-11-30 DIAGNOSIS — Z139 Encounter for screening, unspecified: Secondary | ICD-10-CM | POA: Diagnosis not present

## 2015-11-30 DIAGNOSIS — E1149 Type 2 diabetes mellitus with other diabetic neurological complication: Secondary | ICD-10-CM | POA: Diagnosis not present

## 2016-10-07 ENCOUNTER — Other Ambulatory Visit: Payer: Self-pay | Admitting: Nurse Practitioner

## 2016-10-07 DIAGNOSIS — Z1231 Encounter for screening mammogram for malignant neoplasm of breast: Secondary | ICD-10-CM

## 2016-11-18 ENCOUNTER — Ambulatory Visit: Payer: Self-pay

## 2016-12-02 ENCOUNTER — Ambulatory Visit: Payer: Self-pay

## 2016-12-15 ENCOUNTER — Other Ambulatory Visit: Payer: Self-pay | Admitting: Nurse Practitioner

## 2016-12-15 DIAGNOSIS — E2839 Other primary ovarian failure: Secondary | ICD-10-CM

## 2016-12-23 ENCOUNTER — Ambulatory Visit: Payer: Self-pay

## 2017-01-23 ENCOUNTER — Ambulatory Visit
Admission: RE | Admit: 2017-01-23 | Discharge: 2017-01-23 | Disposition: A | Discharge status: Home / Self Care | Payer: Medicare Other | Source: Ambulatory Visit | Attending: Nurse Practitioner | Admitting: Nurse Practitioner

## 2017-01-23 DIAGNOSIS — Z1231 Encounter for screening mammogram for malignant neoplasm of breast: Secondary | ICD-10-CM

## 2017-04-22 ENCOUNTER — Encounter (HOSPITAL_COMMUNITY): Payer: Self-pay | Admitting: Emergency Medicine

## 2017-04-22 ENCOUNTER — Other Ambulatory Visit: Payer: Self-pay

## 2017-04-22 DIAGNOSIS — R42 Dizziness and giddiness: Secondary | ICD-10-CM | POA: Diagnosis not present

## 2017-04-22 DIAGNOSIS — Z5321 Procedure and treatment not carried out due to patient leaving prior to being seen by health care provider: Secondary | ICD-10-CM | POA: Insufficient documentation

## 2017-04-22 NOTE — ED Triage Notes (Signed)
The patient's daughter is here with the patient and she says her mother has been taking Alprazolam for a while and she is out.  The daughter says the mother has been out of medication for 4-5 days and has been dizzy, throwing up, shaking, cannot sleep,cannot eat.  The patient says she is supposed to take it three times a day.  The patient said she went to her MD for a follow up and failed to let them know she is out of her alprazolam.

## 2017-04-23 ENCOUNTER — Emergency Department (HOSPITAL_COMMUNITY)
Admission: EM | Admit: 2017-04-23 | Discharge: 2017-04-23 | Disposition: A | Discharge status: Home / Self Care | Payer: Medicare Other | Attending: Emergency Medicine | Admitting: Emergency Medicine

## 2017-04-23 NOTE — ED Notes (Signed)
Pt called for room no answer

## 2017-04-23 NOTE — ED Notes (Signed)
Patient called for room, no answer. 

## 2017-04-23 NOTE — ED Notes (Signed)
Pt has been called x 3 no answer 

## 2017-04-28 ENCOUNTER — Encounter (HOSPITAL_COMMUNITY): Payer: Self-pay

## 2017-04-28 ENCOUNTER — Emergency Department (HOSPITAL_COMMUNITY): Payer: Medicare Other

## 2017-04-28 ENCOUNTER — Observation Stay (HOSPITAL_COMMUNITY)
Admission: EM | Admit: 2017-04-28 | Discharge: 2017-05-01 | Disposition: A | Discharge status: Home / Self Care | Payer: Medicare Other | Attending: Internal Medicine | Admitting: Internal Medicine

## 2017-04-28 ENCOUNTER — Other Ambulatory Visit: Payer: Self-pay

## 2017-04-28 DIAGNOSIS — R55 Syncope and collapse: Principal | ICD-10-CM | POA: Diagnosis present

## 2017-04-28 DIAGNOSIS — D72829 Elevated white blood cell count, unspecified: Secondary | ICD-10-CM | POA: Diagnosis not present

## 2017-04-28 DIAGNOSIS — E876 Hypokalemia: Secondary | ICD-10-CM | POA: Insufficient documentation

## 2017-04-28 DIAGNOSIS — E860 Dehydration: Secondary | ICD-10-CM | POA: Insufficient documentation

## 2017-04-28 DIAGNOSIS — E1122 Type 2 diabetes mellitus with diabetic chronic kidney disease: Secondary | ICD-10-CM | POA: Diagnosis not present

## 2017-04-28 DIAGNOSIS — N182 Chronic kidney disease, stage 2 (mild): Secondary | ICD-10-CM | POA: Diagnosis not present

## 2017-04-28 DIAGNOSIS — M1711 Unilateral primary osteoarthritis, right knee: Secondary | ICD-10-CM | POA: Diagnosis not present

## 2017-04-28 DIAGNOSIS — N179 Acute kidney failure, unspecified: Secondary | ICD-10-CM | POA: Diagnosis not present

## 2017-04-28 DIAGNOSIS — I129 Hypertensive chronic kidney disease with stage 1 through stage 4 chronic kidney disease, or unspecified chronic kidney disease: Secondary | ICD-10-CM | POA: Insufficient documentation

## 2017-04-28 DIAGNOSIS — M2341 Loose body in knee, right knee: Secondary | ICD-10-CM | POA: Insufficient documentation

## 2017-04-28 DIAGNOSIS — Z885 Allergy status to narcotic agent status: Secondary | ICD-10-CM | POA: Diagnosis not present

## 2017-04-28 DIAGNOSIS — F32A Depression, unspecified: Secondary | ICD-10-CM | POA: Diagnosis present

## 2017-04-28 DIAGNOSIS — G8929 Other chronic pain: Secondary | ICD-10-CM | POA: Diagnosis not present

## 2017-04-28 DIAGNOSIS — R296 Repeated falls: Secondary | ICD-10-CM | POA: Insufficient documentation

## 2017-04-28 DIAGNOSIS — Z91030 Bee allergy status: Secondary | ICD-10-CM | POA: Insufficient documentation

## 2017-04-28 DIAGNOSIS — M25461 Effusion, right knee: Secondary | ICD-10-CM | POA: Diagnosis not present

## 2017-04-28 DIAGNOSIS — F329 Major depressive disorder, single episode, unspecified: Secondary | ICD-10-CM | POA: Insufficient documentation

## 2017-04-28 DIAGNOSIS — I700 Atherosclerosis of aorta: Secondary | ICD-10-CM | POA: Insufficient documentation

## 2017-04-28 DIAGNOSIS — Z87891 Personal history of nicotine dependence: Secondary | ICD-10-CM | POA: Insufficient documentation

## 2017-04-28 DIAGNOSIS — Z8249 Family history of ischemic heart disease and other diseases of the circulatory system: Secondary | ICD-10-CM | POA: Insufficient documentation

## 2017-04-28 DIAGNOSIS — R51 Headache: Secondary | ICD-10-CM | POA: Diagnosis not present

## 2017-04-28 DIAGNOSIS — R0981 Nasal congestion: Secondary | ICD-10-CM | POA: Insufficient documentation

## 2017-04-28 DIAGNOSIS — F419 Anxiety disorder, unspecified: Secondary | ICD-10-CM | POA: Diagnosis present

## 2017-04-28 DIAGNOSIS — E871 Hypo-osmolality and hyponatremia: Secondary | ICD-10-CM | POA: Insufficient documentation

## 2017-04-28 DIAGNOSIS — R000 Tachycardia, unspecified: Secondary | ICD-10-CM | POA: Insufficient documentation

## 2017-04-28 DIAGNOSIS — M25561 Pain in right knee: Secondary | ICD-10-CM | POA: Diagnosis not present

## 2017-04-28 DIAGNOSIS — E785 Hyperlipidemia, unspecified: Secondary | ICD-10-CM | POA: Insufficient documentation

## 2017-04-28 DIAGNOSIS — Z79899 Other long term (current) drug therapy: Secondary | ICD-10-CM | POA: Insufficient documentation

## 2017-04-28 DIAGNOSIS — E1151 Type 2 diabetes mellitus with diabetic peripheral angiopathy without gangrene: Secondary | ICD-10-CM | POA: Insufficient documentation

## 2017-04-28 LAB — ETHANOL: Alcohol, Ethyl (B): <10 mg/dL (ref <10)

## 2017-04-28 LAB — CBC
HCT: 49.4 % — ABNORMAL HIGH (ref 36.0–46.0)
Hemoglobin: 18.3 g/dL — ABNORMAL HIGH (ref 12.0–15.0)
MCH: 31.2 pg (ref 26.0–34.0)
MCHC: 37 g/dL — ABNORMAL HIGH (ref 30.0–36.0)
MCV: 84.3 fL (ref 78.0–100.0)
Platelets: 254 10*3/uL (ref 150–400)
RBC: 5.86 MIL/uL — ABNORMAL HIGH (ref 3.87–5.11)
RDW: 13.9 % (ref 11.5–15.5)
WBC: 10.6 10*3/uL — ABNORMAL HIGH (ref 4.0–10.5)

## 2017-04-28 LAB — HEPATIC FUNCTION PANEL
ALT: 26 U/L (ref 14–54)
AST: 27 U/L (ref 15–41)
Albumin: 4.1 g/dL (ref 3.5–5.0)
Alkaline Phosphatase: 25 U/L — ABNORMAL LOW (ref 38–126)
Bilirubin, Direct: 0.3 mg/dL (ref 0.1–0.5)
Indirect Bilirubin: 0.7 mg/dL (ref 0.3–0.9)
Total Bilirubin: 1 mg/dL (ref 0.3–1.2)
Total Protein: 7.5 g/dL (ref 6.5–8.1)

## 2017-04-28 LAB — BASIC METABOLIC PANEL
Anion gap: 14 (ref 5–15)
BUN: 29 mg/dL — ABNORMAL HIGH (ref 6–20)
CO2: 29 mmol/L (ref 22–32)
Calcium: 10.3 mg/dL (ref 8.9–10.3)
Chloride: 81 mmol/L — ABNORMAL LOW (ref 101–111)
Creatinine, Ser: 1.18 mg/dL — ABNORMAL HIGH (ref 0.44–1.00)
GFR calc Af Amer: 54 mL/min — ABNORMAL LOW (ref >60)
GFR calc non Af Amer: 46 mL/min — ABNORMAL LOW (ref >60)
Glucose, Bld: 124 mg/dL — ABNORMAL HIGH (ref 65–99)
Potassium: 3.6 mmol/L (ref 3.5–5.1)
Sodium: 124 mmol/L — ABNORMAL LOW (ref 135–145)

## 2017-04-28 LAB — CBG MONITORING, ED: Glucose-Capillary: 131 mg/dL — ABNORMAL HIGH (ref 65–99)

## 2017-04-28 LAB — LIPASE, BLOOD: Lipase: 40 U/L (ref 11–51)

## 2017-04-28 MED ORDER — SODIUM CHLORIDE 0.9 % IV BOLUS (SEPSIS)
1000.0000 mL | Freq: Once | INTRAVENOUS | Status: AC
  Administered 2017-04-28: 1000 mL via INTRAVENOUS

## 2017-04-28 MED ORDER — ONDANSETRON HCL 4 MG PO TABS: 4.0000 mg | ORAL_TABLET | Freq: Four times a day (QID) | ORAL | Status: DC | PRN

## 2017-04-28 MED ORDER — ALBUTEROL SULFATE (2.5 MG/3ML) 0.083% IN NEBU: 2.5000 mg | INHALATION_SOLUTION | RESPIRATORY_TRACT | Status: DC | PRN

## 2017-04-28 MED ORDER — ACETAMINOPHEN 325 MG PO TABS
650.0000 mg | ORAL_TABLET | Freq: Four times a day (QID) | ORAL | Status: DC | PRN
  Administered 2017-04-29 – 2017-04-30 (×4): 650 mg via ORAL
  Filled 2017-04-28 (×4): qty 2

## 2017-04-28 MED ORDER — SODIUM CHLORIDE 0.9 % IV SOLN
INTRAVENOUS | Status: DC
  Administered 2017-04-28 – 2017-04-30 (×4): via INTRAVENOUS

## 2017-04-28 MED ORDER — ONDANSETRON HCL 4 MG/2ML IJ SOLN
4.0000 mg | Freq: Once | INTRAMUSCULAR | Status: AC
  Administered 2017-04-28: 4 mg via INTRAVENOUS
  Filled 2017-04-28: qty 2

## 2017-04-28 MED ORDER — ESCITALOPRAM OXALATE 10 MG PO TABS
40.0000 mg | ORAL_TABLET | Freq: Every day | ORAL | Status: DC
  Administered 2017-04-29 – 2017-05-01 (×3): 40 mg via ORAL
  Filled 2017-04-28 (×3): qty 4

## 2017-04-28 MED ORDER — ACETAMINOPHEN 650 MG RE SUPP: 650.0000 mg | Freq: Four times a day (QID) | RECTAL | Status: DC | PRN

## 2017-04-28 MED ORDER — KETOROLAC TROMETHAMINE 30 MG/ML IJ SOLN
30.0000 mg | Freq: Once | INTRAMUSCULAR | Status: AC
  Administered 2017-04-28: 30 mg via INTRAVENOUS
  Filled 2017-04-28: qty 1

## 2017-04-28 MED ORDER — ATORVASTATIN CALCIUM 40 MG PO TABS
40.0000 mg | ORAL_TABLET | Freq: Every day | ORAL | Status: DC
  Administered 2017-04-29 – 2017-05-01 (×3): 40 mg via ORAL
  Filled 2017-04-28 (×3): qty 1

## 2017-04-28 MED ORDER — ONDANSETRON HCL 4 MG/2ML IJ SOLN: 4.0000 mg | Freq: Four times a day (QID) | INTRAMUSCULAR | Status: DC | PRN

## 2017-04-28 MED ORDER — NICOTINE 21 MG/24HR TD PT24
21.0000 mg | MEDICATED_PATCH | Freq: Every day | TRANSDERMAL | Status: DC
  Administered 2017-04-28 – 2017-05-01 (×4): 21 mg via TRANSDERMAL
  Filled 2017-04-28 (×4): qty 1

## 2017-04-28 MED ORDER — AMITRIPTYLINE HCL 50 MG PO TABS
150.0000 mg | ORAL_TABLET | Freq: Every day | ORAL | Status: DC
  Administered 2017-04-29 – 2017-05-01 (×3): 150 mg via ORAL
  Filled 2017-04-28: qty 6
  Filled 2017-04-28: qty 3
  Filled 2017-04-28: qty 6
  Filled 2017-04-28 (×2): qty 3

## 2017-04-28 MED ORDER — PANTOPRAZOLE SODIUM 40 MG PO TBEC
40.0000 mg | DELAYED_RELEASE_TABLET | Freq: Two times a day (BID) | ORAL | Status: DC
  Administered 2017-04-28 – 2017-05-01 (×6): 40 mg via ORAL
  Filled 2017-04-28 (×6): qty 1

## 2017-04-28 MED ORDER — ENOXAPARIN SODIUM 40 MG/0.4ML ~~LOC~~ SOLN
40.0000 mg | SUBCUTANEOUS | Status: DC
  Administered 2017-04-28 – 2017-04-30 (×3): 40 mg via SUBCUTANEOUS
  Filled 2017-04-28 (×4): qty 0.4

## 2017-04-28 NOTE — H&P (Signed)
History and Physical    Alexandra Richardson:[id number] DOB: 28-Mar-1950 DOA: 04/28/2017  PCP: Ailene Ravel, MD  Patient coming from: Home  I have personally briefly reviewed patient's old medical records in [facility]  Chief Complaint: Near syncope  HPI: Alexandra Richardson is a 67 y.o. female with medical history significant of HLD, HTN, and anxiety presents to the emergency permit for evaluation of reported syncope.  The patient states she has had 2 episodes in the last 2 days.  She describes feeling lightheaded when standing.  No chest pain, palpitations, dyspnea.  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  She was caught by the delivery person before hitting the ground but states she has had other falls resulting in head trauma recently.  She states her doctor stopped her longtime Xanax prescription (~2 weeks ago last dose) without taper.  She reports severely decreased PO intake during this time period.   ED Course: Work up suspicious for dehydration with sodium 124, cl 81, BUN 29 creat 1.18, HGB 18.   Review of Systems: As per HPI otherwise 10 point review of systems negative.   Past Medical History:  Diagnosis Date  . Anxiety   . Anxiety   . Arthritis   . Depression   . Grief 07/03/2011  . Hyperlipidemia   . Hypertension     Past Surgical History:  Procedure Laterality Date  . CHOLECYSTECTOMY       reports that she has quit smoking. Her smoking use included cigarettes. She has a 40.00 pack-year smoking history. She has never used smokeless tobacco. She reports that she drinks alcohol. She reports that she does not use drugs.  Allergies  Allergen Reactions  . Bee Venom Other (See Comments)  . Codeine Nausea And Vomiting    Family History  Problem Relation Age of Onset  . Coronary artery disease Father   . Diabetes type II Sister   . Hypertension Mother      Prior to Admission medications     Medication Sig Start Date End Date Taking? Authorizing Provider  albuterol (PROVENTIL HFA;VENTOLIN HFA) 108 (90 BASE) MCG/ACT inhaler Inhale 2 puffs into the lungs every 6 (six) hours as needed for shortness of breath. For shortness of breath. 02/12/13   Jeanella Craze, NP  albuterol (PROVENTIL) (2.5 MG/3ML) 0.083% nebulizer solution Take 3 mLs (2.5 mg total) by nebulization every 4 (four) hours as needed for wheezing or shortness of breath. Use neb or proventil but not both at the same time 02/12/13   Jeanella Craze, NP  amitriptyline (ELAVIL) 150 MG tablet Take 1 tablet by mouth daily. 01/15/13   [provider]  atorvastatin (LIPITOR) 40 MG tablet Take 40 mg by mouth daily.    [provider]  escitalopram (LEXAPRO) 20 MG tablet Take 40 mg by mouth daily.    [provider]  furosemide (LASIX) 40 MG tablet Take 40 mg by mouth daily.    [provider]  omeprazole (PRILOSEC) 20 MG capsule Take 20 mg by mouth daily.    [provider]  risperiDONE (RISPERDAL) 1 MG tablet Take 1 tablet (1 mg total) by mouth 2 (two) times daily. 02/12/13   Jeanella Craze, NP  tiotropium (SPIRIVA) 18 MCG inhalation capsule Place 1 capsule (18 mcg total) into inhaler and inhale daily. 02/12/13   Jeanella Craze, NP    Physical Exam: Vitals:  04/28/17 1730 04/28/17 1800 04/28/17 1924 04/28/17 2029  BP: 132/83 106/75 120/75 111/65  Pulse: (!) 101 (!) 103  (!) 102  Resp: (!) 24 15  18   Temp:    98.2 F (36.8 C)  TempSrc:    Oral  SpO2: 91% 94%  93%  Weight:    70.5 kg (155 lb 6.8 oz)  Height:    5\' 4"  (1.626 m)    Constitutional: NAD, calm, comfortable Eyes: PERRL, lids and conjunctivae normal ENMT: Mucous membranes are moist. Posterior pharynx clear of any exudate or lesions.Normal dentition.  Neck: normal, supple, no masses, no thyromegaly Respiratory: clear to auscultation bilaterally, no wheezing, no crackles. Normal respiratory effort. No accessory muscle use.   Cardiovascular: Regular rate and rhythm, no murmurs / rubs / gallops. No extremity edema. 2+ pedal pulses. No carotid bruits.  Abdomen: no tenderness, no masses palpated. No hepatosplenomegaly. Bowel sounds positive.  Musculoskeletal: no clubbing / cyanosis. No joint deformity upper and lower extremities. Good ROM, no contractures. Normal muscle tone.  Skin: no rashes, lesions, ulcers. No induration Neurologic: CN 2-12 grossly intact. Sensation intact, DTR normal. Strength 5/5 in all 4.  Psychiatric: Normal judgment and insight. Alert and oriented x 3. Normal mood.    Labs on Admission: I have personally reviewed following labs and imaging studies  CBC: Recent Labs  Lab 04/28/17 1501  WBC 10.6*  HGB 18.3*  HCT 49.4*  MCV 84.3  PLT 254   Basic Metabolic Panel: Recent Labs  Lab 04/28/17 1501  NA 124*  K 3.6  CL 81*  CO2 29  GLUCOSE 124*  BUN 29*  CREATININE 1.18*  CALCIUM 10.3   GFR: Estimated Creatinine Clearance: 43.9 mL/min (A) (by C-G formula based on SCr of 1.18 mg/dL (H)). Liver Function Tests: Recent Labs  Lab 04/28/17 1601  AST 27  ALT 26  ALKPHOS 25*  BILITOT 1.0  PROT 7.5  ALBUMIN 4.1   Recent Labs  Lab 04/28/17 1601  LIPASE 40   No results for input(s): AMMONIA in the last 168 hours. Coagulation Profile: No results for input(s): INR, PROTIME in the last 168 hours. Cardiac Enzymes: No results for input(s): CKTOTAL, CKMB, CKMBINDEX, TROPONINI in the last 168 hours. BNP (last 3 results) No results for input(s): PROBNP in the last 8760 hours. HbA1C: No results for input(s): HGBA1C in the last 72 hours. CBG: Recent Labs  Lab 04/28/17 1504  GLUCAP 131*   Lipid Profile: No results for input(s): CHOL, HDL, LDLCALC, TRIG, CHOLHDL, LDLDIRECT in the last 72 hours. Thyroid Function Tests: No results for input(s): TSH, T4TOTAL, FREET4, T3FREE, THYROIDAB in the last 72 hours. Anemia Panel: No results for input(s): VITAMINB12, FOLATE, FERRITIN, TIBC,  IRON, RETICCTPCT in the last 72 hours. Urine analysis:    Component Value Date/Time   COLORURINE AMBER (A) 02/07/2013 1827   APPEARANCEUR CLOUDY (A) 02/07/2013 1827   LABSPEC 1.023 02/07/2013 1827   PHURINE 5.5 02/07/2013 1827   GLUCOSEU NEGATIVE 02/07/2013 1827   HGBUR MODERATE (A) 02/07/2013 1827   BILIRUBINUR SMALL (A) 02/07/2013 1827   KETONESUR 15 (A) 02/07/2013 1827   PROTEINUR 100 (A) 02/07/2013 1827   UROBILINOGEN 2.0 (H) 02/07/2013 1827   NITRITE NEGATIVE 02/07/2013 1827   LEUKOCYTESUR SMALL (A) 02/07/2013 1827    Radiological Exams on Admission: Dg Chest 2 View  Result Date: 04/28/2017 CLINICAL DATA:  Weakness for 2-3 days. EXAM: CHEST - 2 VIEW COMPARISON:  PA and lateral chest 02/22/2013 and 04/19/2010. FINDINGS: The lungs are clear. Heart  size is normal. No pneumothorax or pleural effusion. No acute bony abnormality. IMPRESSION: No acute disease. Electronically Signed   By: Drusilla Kanner M.D.   On: 04/28/2017 15:47   Ct Head Wo Contrast  Result Date: 04/28/2017 CLINICAL DATA:  67 year old female with syncope versus presyncope at home this morning. EXAM: CT HEAD WITHOUT CONTRAST TECHNIQUE: Contiguous axial images were obtained from the base of the skull through the vertex without intravenous contrast. COMPARISON:  Brain MRI 11/17/2009. Head CT without contrast 11/15/2009. FINDINGS: Brain: Cerebral volume remains normal for age. Cavum septum pellucidum (normal variant). No midline shift, ventriculomegaly, mass effect, evidence of mass lesion, intracranial hemorrhage or evidence of cortically based acute infarction. Scattered mild for age cerebral white matter hypodensity appears increased since 2011. No cortical encephalomalacia identified. Vascular: Calcified atherosclerosis at the skull base. No suspicious intracranial vascular hyperdensity. Skull: Stable and negative. Sinuses/Orbits: Clear. Other: Visualized orbit soft tissues are within normal limits. Visualized scalp soft  tissues are within normal limits. IMPRESSION: 1.  No acute intracranial abnormality. 2. Mild for age nonspecific white matter changes appear progressed since 2011, most commonly due to small vessel disease. Electronically Signed   By: Odessa Fleming M.D.   On: 04/28/2017 15:59    EKG: Independently reviewed.  Assessment/Plan Principal Problem:   Near syncope Active Problems:   Depression   Anxiety   Dehydration with hyponatremia    1. Near syncope - 1. Suspect orthostasis associated with dehydration 2. Syncope pathway 3. Tele monitor 4. 2d echo 5. UDS pending 2. Dehydration with hyponatremia - 1. IVF: 1L bolus then 125 cc/hr NS 2. Repeat BMP in AM 3. UA, urine sodium, urine osm pending 4. CXR neg 3. Anxiety and depression - 1. Continue elavil and lexapro 2. Patient unsure of some of the other home meds 4. HTN - unknown home meds at this point, monitor and add PRN if needed  DVT prophylaxis: Lovenox Code Status: Full Family Communication: No family in room Disposition Plan: Home after admit Consults called: None Admission status: Place in obs   Hillary Bow. DO [facility] Pager [phone]  If 7AM-7PM, please contact day team taking care of patient [email or web address] Password [email or web address]  04/28/2017, 8:59 PM

## 2017-04-28 NOTE — ED Notes (Signed)
Patient transported to X-ray 

## 2017-04-28 NOTE — ED Notes (Signed)
ED TO INPATIENT HANDOFF REPORT  Name/Age/Gender Alexandra Richardson 67 y.o. female  Code Status    Code Status Orders  (From admission, onward)        Start     Ordered   04/28/17 1929  Full code  Continuous     04/28/17 1930    Code Status History    Date Active Date Inactive Code Status Order ID Comments User Context   02/07/2013 2035 02/12/2013 1736 Full Code [id number]  Chesley Mires, MD ED   07/01/2011 2147 07/03/2011 1623 Full Code [id number]  Theressa Millard, MD ED      Home/SNF/Other Home  Chief Complaint syncope  Level of Care/Admitting Diagnosis ED Disposition    ED Disposition Condition [facility]: [facility] [[id number]]  Level of Care: Telemetry [5]  Admit to tele based on following criteria: Eval of Syncope  Diagnosis: Near syncope [786767]  Admitting Physician: Etta Quill [[id number]]  Attending Physician: Etta Quill [[id number]]  PT Class (Do Not Modify): Observation [104]  PT Acc Code (Do Not Modify): Observation [10022]       Medical History Past Medical History:  Diagnosis Date  . Anxiety   . Anxiety   . Arthritis   . Depression   . Grief 07/03/2011  . Hyperlipidemia   . Hypertension     Allergies Allergies  Allergen Reactions  . Bee Venom Other (See Comments)  . Codeine Nausea And Vomiting    IV Location/Drains/Wounds Patient Lines/Drains/Airways Status   Active Line/Drains/Airways    Name:   Placement date:   Placement time:   Site:   Days:   Peripheral IV 04/28/17 Right Antecubital   04/28/17    1501    Antecubital   less than 1          Labs/Imaging Results for orders placed or performed during the hospital encounter of 04/28/17 (from the past 48 hour(s))  Basic metabolic panel     Status: Abnormal   Collection Time: 04/28/17  3:01 PM  Result Value Ref Range   Sodium 124 (L) 135 - 145 mmol/L   Potassium 3.6 3.5 - 5.1 mmol/L   Chloride 81 (L) 101 - 111 mmol/L   CO2 29 22 - 32 mmol/L   Glucose, Bld 124 (H) 65 - 99 mg/dL   BUN 29 (H) 6 - 20 mg/dL   Creatinine, Ser 1.18 (H) 0.44 - 1.00 mg/dL   Calcium 10.3 8.9 - 10.3 mg/dL   GFR calc non Af Amer 46 (L) >60 mL/min   GFR calc Af Amer 54 (L) >60 mL/min    Comment: (NOTE) The eGFR has been calculated using the CKD EPI equation. This calculation has not been validated in all clinical situations. eGFR's persistently <60 mL/min signify possible Chronic Kidney Disease.    Anion gap 14 5 - 15    Comment: Performed at [facility], [place] [address], [place], [place] [address]  CBC     Status: Abnormal   Collection Time: 04/28/17  3:01 PM  Result Value Ref Range   WBC 10.6 (H) 4.0 - 10.5 K/uL   RBC 5.86 (H) 3.87 - 5.11 MIL/uL   Hemoglobin 18.3 (H) 12.0 - 15.0 g/dL   HCT 49.4 (H) 36.0 - 46.0 %   MCV 84.3 78.0 - 100.0 fL   MCH 31.2 26.0 - 34.0 pg   MCHC 37.0 (H) 30.0 - 36.0 g/dL   RDW 13.9 11.5 - 15.5 %  Platelets 254 150 - 400 K/uL    Comment: Performed at [facility], [place] [address] [place], [place] [address]  CBG monitoring, ED     Status: Abnormal   Collection Time: 04/28/17  3:04 PM  Result Value Ref Range   Glucose-Capillary 131 (H) 65 - 99 mg/dL  Lipase, blood     Status: None   Collection Time: 04/28/17  4:01 PM  Result Value Ref Range   Lipase 40 11 - 51 U/L    Comment: Performed at [facility], [place] [address] [place], [place] [address]  Ethanol     Status: None   Collection Time: 04/28/17  4:01 PM  Result Value Ref Range   Alcohol, Ethyl (B) <10 <10 mg/dL    Comment:        LOWEST DETECTABLE LIMIT FOR SERUM ALCOHOL IS 10 mg/dL FOR MEDICAL PURPOSES ONLY Performed at [facility] [address], [place], [place] [address]   Hepatic function panel     Status: Abnormal   Collection Time: 04/28/17  4:01 PM  Result Value Ref Range   Total Protein 7.5 6.5 - 8.1 g/dL   Albumin 4.1 3.5 - 5.0 g/dL   AST 27 15 - 41 U/L   ALT 26 14 -  54 U/L   Alkaline Phosphatase 25 (L) 38 - 126 U/L   Total Bilirubin 1.0 0.3 - 1.2 mg/dL   Bilirubin, Direct 0.3 0.1 - 0.5 mg/dL   Indirect Bilirubin 0.7 0.3 - 0.9 mg/dL    Comment: Performed at [facility], [place] [address] [place], [place] [address]   Dg Chest 2 View  Result Date: 04/28/2017 CLINICAL DATA:  Weakness for 2-3 days. EXAM: CHEST - 2 VIEW COMPARISON:  PA and lateral chest 02/22/2013 and 04/19/2010. FINDINGS: The lungs are clear. Heart size is normal. No pneumothorax or pleural effusion. No acute bony abnormality. IMPRESSION: No acute disease. Electronically Signed   By: Inge Rise M.D.   On: 04/28/2017 15:47   Ct Head Wo Contrast  Result Date: 04/28/2017 CLINICAL DATA:  67 year old female with syncope versus presyncope at home this morning. EXAM: CT HEAD WITHOUT CONTRAST TECHNIQUE: Contiguous axial images were obtained from the base of the skull through the vertex without intravenous contrast. COMPARISON:  Brain MRI 11/17/2009. Head CT without contrast 11/15/2009. FINDINGS: Brain: Cerebral volume remains normal for age. Cavum septum pellucidum (normal variant). No midline shift, ventriculomegaly, mass effect, evidence of mass lesion, intracranial hemorrhage or evidence of cortically based acute infarction. Scattered mild for age cerebral white matter hypodensity appears increased since 2011. No cortical encephalomalacia identified. Vascular: Calcified atherosclerosis at the skull base. No suspicious intracranial vascular hyperdensity. Skull: Stable and negative. Sinuses/Orbits: Clear. Other: Visualized orbit soft tissues are within normal limits. Visualized scalp soft tissues are within normal limits. IMPRESSION: 1.  No acute intracranial abnormality. 2. Mild for age nonspecific white matter changes appear progressed since 2011, most commonly due to small vessel disease. Electronically Signed   By: Genevie Ann M.D.   On: 04/28/2017 15:59    Pending Labs Unresulted  Labs (From admission, onward)   Start     Ordered   04/29/17 9323  Basic metabolic panel  Tomorrow morning,   R     04/28/17 1930   04/28/17 1916  Urine rapid drug screen (hosp performed)  STAT,   R     04/28/17 1915   04/28/17 1916  Sodium, urine, random  Once,   R  04/28/17 1915   04/28/17 1916  Osmolality, urine  Once,   R     04/28/17 1915   04/28/17 1459  Urinalysis, Routine w reflex microscopic  Once,   STAT     04/28/17 1459      Vitals/Pain Today's Vitals   04/28/17 1630 04/28/17 1730 04/28/17 1800 04/28/17 1924  BP: 112/69 132/83 106/75 120/75  Pulse:  (!) 101 (!) 103   Resp:  (!) 24 15   Temp:      TempSrc:      SpO2:  91% 94%   Weight:      Height:      PainSc:        Isolation Precautions No active isolations  Medications Medications  0.9 %  sodium chloride infusion (has no administration in time range)  acetaminophen (TYLENOL) tablet 650 mg (has no administration in time range)    Or  acetaminophen (TYLENOL) suppository 650 mg (has no administration in time range)  ondansetron (ZOFRAN) tablet 4 mg (has no administration in time range)    Or  ondansetron (ZOFRAN) injection 4 mg (has no administration in time range)  enoxaparin (LOVENOX) injection 40 mg (has no administration in time range)  sodium chloride 0.9 % bolus 1,000 mL (1,000 mLs Intravenous New Bag/Given 04/28/17 1607)  ondansetron (ZOFRAN) injection 4 mg (4 mg Intravenous Given 04/28/17 1607)  ketorolac (TORADOL) 30 MG/ML injection 30 mg (30 mg Intravenous Given 04/28/17 1607)    Mobility walks

## 2017-04-28 NOTE — ED Triage Notes (Signed)
Pt BIB [facility]. 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 She reports that she is unsure if she passed out. She states that she is withdrawing from Xanax and thinks that her weakness and syncope are a result. She also had 1 syncopal episode yesterday. She denies any head injury or neck pain. A&Ox4 in triage

## 2017-04-28 NOTE — ED Notes (Signed)
Bed: WA16 Expected date:  Expected time:  Means of arrival:  Comments: EMS 

## 2017-04-28 NOTE — ED Notes (Signed)
ED Provider at bedside. 

## 2017-04-28 NOTE — ED Provider Notes (Signed)
Emergency Department Provider Note   I have reviewed the triage vital signs and the nursing notes.   HISTORY  Chief Complaint Loss of Consciousness   HPI Alexandra Richardson is a 67 y.o. female with PMH of HLD, HTN, and anxiety presents to the emergency permit for evaluation of reported syncope.  The patient states she has had 2 episodes in the last 2 days.  She describes feeling lightheaded when standing.  No chest pain, palpitations, dyspnea.  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  She was caught by the delivery person before hitting the ground but states she has had other falls resulting in head trauma recently.  She states her doctor stopped her longtime Xanax prescription 7 days ago without taper. No other medication changes.  She has had continued chronic, total body pain and some nausea.  She states she is been drinking fluids but not eating well.    Past Medical History:  Diagnosis Date  . Anxiety   . Anxiety   . Arthritis   . Depression   . Grief 07/03/2011  . Hyperlipidemia   . Hypertension     Patient Active Problem List   Diagnosis Date Noted  . Near syncope 04/28/2017  . Dehydration with hyponatremia 04/28/2017  . Pneumonia 02/07/2013  . Hypokalemia 07/03/2011  . Grief 07/03/2011  . Encephalopathy 07/02/2011  . Hyperlipidemia 07/02/2011  . Anxiety 07/02/2011  . Overdose 07/01/2011  . Altered mental status 07/01/2011  . Hypertension 07/01/2011  . Depression 07/01/2011  . UTI (lower urinary tract infection) 07/01/2011    Past Surgical History:  Procedure Laterality Date  . CHOLECYSTECTOMY        Allergies Bee venom and Codeine  Family History  Problem Relation Age of Onset  . Coronary artery disease Father   . Diabetes type II Sister   . Hypertension Mother     Social History Social History   Tobacco Use  . Smoking status: Former Smoker    Packs/day: 1.00    Years: 40.00   Pack years: 40.00    Types: Cigarettes  . Smokeless tobacco: Never Used  Substance Use Topics  . Alcohol use: Yes    Comment: occasionally  . Drug use: No    Review of Systems  Constitutional: No fever/chills Eyes: No visual changes. ENT: No sore throat. Cardiovascular: Denies chest pain. Positive lightheadedness with standing.  Respiratory: Denies shortness of breath. Gastrointestinal: No abdominal pain. Positive nausea, no vomiting.  No diarrhea.  No constipation. Genitourinary: Negative for dysuria. Musculoskeletal: Positive total body pain.  Skin: Negative for rash. Neurological: Negative for headaches, focal weakness or numbness.  10-point ROS otherwise negative.  ____________________________________________   PHYSICAL EXAM:  VITAL SIGNS: ED Triage Vitals  Enc Vitals Group     BP 04/28/17 1445 108/71     Pulse Rate 04/28/17 1445 (!) 102     Resp 04/28/17 1445 15     Temp 04/28/17 1445 98.1 F (36.7 C)     Temp Source 04/28/17 1445 Oral     SpO2 04/28/17 1445 93 %     Weight 04/28/17 1445 167 lb (75.8 kg)     Height 04/28/17 1445 5\' 4"  (1.626 m)     Pain Score 04/28/17 1454 8   Constitutional: Alert and oriented. Generally not well kept but no acute distress.  Eyes: Conjunctivae are normal. PERRL.  Head: Atraumatic. Nose: No congestion/rhinnorhea. Mouth/Throat: Mucous membranes are  dry.  Neck: No stridor. Cardiovascular: Tachycardia. Good peripheral circulation. Grossly normal heart sounds.   Respiratory: Normal respiratory effort.  No retractions. Lungs CTAB. Gastrointestinal: Soft and nontender. No distention.  Musculoskeletal: No lower extremity tenderness nor edema. No gross deformities of extremities. Neurologic:  Normal speech and language. No gross focal neurologic deficits are appreciated.  Skin:  Skin is warm and dry. Scabs in various locations over the arms/legs. No cellulitis or ulceration.   ____________________________________________    LABS (all labs ordered are listed, but only abnormal results are displayed)  Labs Reviewed  BASIC METABOLIC PANEL - Abnormal; Notable for the following components:      Result Value   Sodium 124 (*)    Chloride 81 (*)    Glucose, Bld 124 (*)    BUN 29 (*)    Creatinine, Ser 1.18 (*)    GFR calc non Af Amer 46 (*)    GFR calc Af Amer 54 (*)    All other components within normal limits  CBC - Abnormal; Notable for the following components:   WBC 10.6 (*)    RBC 5.86 (*)    Hemoglobin 18.3 (*)    HCT 49.4 (*)    MCHC 37.0 (*)    All other components within normal limits  HEPATIC FUNCTION PANEL - Abnormal; Notable for the following components:   Alkaline Phosphatase 25 (*)    All other components within normal limits  CBG MONITORING, ED - Abnormal; Notable for the following components:   Glucose-Capillary 131 (*)    All other components within normal limits  LIPASE, BLOOD  ETHANOL  URINALYSIS, ROUTINE W REFLEX MICROSCOPIC  RAPID URINE DRUG SCREEN, HOSP PERFORMED  SODIUM, URINE, RANDOM  OSMOLALITY, URINE  BASIC METABOLIC PANEL   ____________________________________________  EKG   EKG Interpretation  Date/Time:  Friday April 28 2017 14:45:20 EDT Ventricular Rate:  101 PR Interval:    QRS Duration: 93 QT Interval:  348 QTC Calculation: 452 R Axis:   -57 Text Interpretation:  Sinus tachycardia Inferior infarct, old Anterior infarct, old No STEMI. Similar to prior.  Confirmed by Alona Bene [phone]) on 04/28/2017 3:05:39 PM       ____________________________________________  RADIOLOGY  Dg Chest 2 View  Result Date: 04/28/2017 CLINICAL DATA:  Weakness for 2-3 days. EXAM: CHEST - 2 VIEW COMPARISON:  PA and lateral chest 02/22/2013 and 04/19/2010. FINDINGS: The lungs are clear. Heart size is normal. No pneumothorax or pleural effusion. No acute bony abnormality. IMPRESSION: No acute disease. Electronically Signed   By: Drusilla Kanner M.D.   On: 04/28/2017 15:47    Ct Head Wo Contrast  Result Date: 04/28/2017 CLINICAL DATA:  67 year old female with syncope versus presyncope at home this morning. EXAM: CT HEAD WITHOUT CONTRAST TECHNIQUE: Contiguous axial images were obtained from the base of the skull through the vertex without intravenous contrast. COMPARISON:  Brain MRI 11/17/2009. Head CT without contrast 11/15/2009. FINDINGS: Brain: Cerebral volume remains normal for age. Cavum septum pellucidum (normal variant). No midline shift, ventriculomegaly, mass effect, evidence of mass lesion, intracranial hemorrhage or evidence of cortically based acute infarction. Scattered mild for age cerebral white matter hypodensity appears increased since 2011. No cortical encephalomalacia identified. Vascular: Calcified atherosclerosis at the skull base. No suspicious intracranial vascular hyperdensity. Skull: Stable and negative. Sinuses/Orbits: Clear. Other: Visualized orbit soft tissues are within normal limits. Visualized scalp soft tissues are within normal limits. IMPRESSION: 1.  No acute intracranial abnormality. 2. Mild for age nonspecific white matter changes appear  progressed since 2011, most commonly due to small vessel disease. Electronically Signed   By: Odessa Fleming M.D.   On: 04/28/2017 15:59    ____________________________________________   PROCEDURES  Procedure(s) performed:   Procedures  None ____________________________________________   INITIAL IMPRESSION / ASSESSMENT AND PLAN / ED COURSE  Pertinent labs & imaging results that were available during my care of the patient were reviewed by me and considered in my medical decision making (see chart for details).  Patient presents to the emergency department for evaluation of lightheadedness with standing resulting in syncope.  She is tachycardic.  No acute distress.  She reports total body pain.  Of significance she had her Xanax prescription discontinued 7 days prior.  No seizures.  At 7 days doubt  this would continue to represent complicated withdrawal from benzodiazepine.  Plan for labs, IV fluids and reassess.  Symptoms seem to be more pronounced with standing which increase my suspicion for orthostatic hypotension. EKG reviewed with no acute findings.   Patient with hyponatremia on labs likely volume depleted and symptomatic. Some confusion noted too. Plan for continued IVF and admit.   Discussed patient's case with Hospitalist, Dr. Julian Reil to request admission. Patient and family (if present) updated with plan. Care transferred to Hospitalist service.  I reviewed all nursing notes, vitals, pertinent old records, EKGs, labs, imaging (as available).  ____________________________________________  FINAL CLINICAL IMPRESSION(S) / ED DIAGNOSES  Final diagnoses:  Syncope and collapse  Hyponatremia     MEDICATIONS GIVEN DURING THIS VISIT:  Medications  0.9 %  sodium chloride infusion ( Intravenous New Bag/Given 04/28/17 2149)  acetaminophen (TYLENOL) tablet 650 mg (has no administration in time range)    Or  acetaminophen (TYLENOL) suppository 650 mg (has no administration in time range)  ondansetron (ZOFRAN) tablet 4 mg (has no administration in time range)    Or  ondansetron (ZOFRAN) injection 4 mg (has no administration in time range)  enoxaparin (LOVENOX) injection 40 mg (40 mg Subcutaneous Given 04/28/17 2149)  amitriptyline (ELAVIL) tablet 150 mg (has no administration in time range)  pantoprazole (PROTONIX) EC tablet 40 mg (40 mg Oral Given 04/28/17 2150)  albuterol (PROVENTIL) (2.5 MG/3ML) 0.083% nebulizer solution 2.5 mg (has no administration in time range)  escitalopram (LEXAPRO) tablet 40 mg (has no administration in time range)  atorvastatin (LIPITOR) tablet 40 mg (has no administration in time range)  nicotine (NICODERM CQ - dosed in mg/24 hours) patch 21 mg (21 mg Transdermal Patch Applied 04/28/17 2149)  sodium chloride 0.9 % bolus 1,000 mL (1,000 mLs Intravenous New  Bag/Given 04/28/17 1607)  ondansetron (ZOFRAN) injection 4 mg (4 mg Intravenous Given 04/28/17 1607)  ketorolac (TORADOL) 30 MG/ML injection 30 mg (30 mg Intravenous Given 04/28/17 1607)    Note:  This document was prepared using Dragon voice recognition software and may include unintentional dictation errors.  Alona Bene, MD Emergency Medicine    Jarad Barth, Arlyss Repress, MD 04/28/17 [phone]

## 2017-04-29 ENCOUNTER — Observation Stay (HOSPITAL_BASED_OUTPATIENT_CLINIC_OR_DEPARTMENT_OTHER): Payer: Medicare Other

## 2017-04-29 DIAGNOSIS — R55 Syncope and collapse: Secondary | ICD-10-CM | POA: Diagnosis not present

## 2017-04-29 DIAGNOSIS — I5030 Unspecified diastolic (congestive) heart failure: Secondary | ICD-10-CM | POA: Diagnosis not present

## 2017-04-29 LAB — URINALYSIS, ROUTINE W REFLEX MICROSCOPIC
Bacteria, UA: NONE SEEN
Bilirubin Urine: NEGATIVE
Glucose, UA: NEGATIVE mg/dL
Ketones, ur: 5 mg/dL — AB
Nitrite: NEGATIVE
Protein, ur: NEGATIVE mg/dL
Specific Gravity, Urine: 1.009 (ref 1.005–1.030)
pH: 5 (ref 5.0–8.0)

## 2017-04-29 LAB — BASIC METABOLIC PANEL
Anion gap: 11 (ref 5–15)
BUN: 23 mg/dL — ABNORMAL HIGH (ref 6–20)
CO2: 28 mmol/L (ref 22–32)
Calcium: 9.4 mg/dL (ref 8.9–10.3)
Chloride: 89 mmol/L — ABNORMAL LOW (ref 101–111)
Creatinine, Ser: 0.96 mg/dL (ref 0.44–1.00)
GFR calc Af Amer: >60 mL/min (ref >60)
GFR calc non Af Amer: 59 mL/min — ABNORMAL LOW (ref >60)
Glucose, Bld: 144 mg/dL — ABNORMAL HIGH (ref 65–99)
Potassium: 3 mmol/L — ABNORMAL LOW (ref 3.5–5.1)
Sodium: 128 mmol/L — ABNORMAL LOW (ref 135–145)

## 2017-04-29 LAB — RAPID URINE DRUG SCREEN, HOSP PERFORMED
Amphetamines: NOT DETECTED
Barbiturates: NOT DETECTED
Benzodiazepines: NOT DETECTED
Cocaine: NOT DETECTED
Opiates: NOT DETECTED
Tetrahydrocannabinol: NOT DETECTED

## 2017-04-29 LAB — ECHOCARDIOGRAM COMPLETE
Height: 64 in
Weight: 2486.79 oz

## 2017-04-29 LAB — OSMOLALITY, URINE: Osmolality, Ur: 295 mOsm/kg — ABNORMAL LOW (ref 300–900)

## 2017-04-29 LAB — MAGNESIUM: Magnesium: 1.8 mg/dL (ref 1.7–2.4)

## 2017-04-29 LAB — SODIUM, URINE, RANDOM: Sodium, Ur: 28 mmol/L

## 2017-04-29 MED ORDER — COSYNTROPIN 0.25 MG IJ SOLR
0.2500 mg | Freq: Once | INTRAMUSCULAR | Status: AC
  Administered 2017-04-30: 0.25 mg via INTRAVENOUS
  Filled 2017-04-29: qty 0.25

## 2017-04-29 MED ORDER — POTASSIUM CHLORIDE 10 MEQ/100ML IV SOLN
10.0000 meq | INTRAVENOUS | Status: DC
  Administered 2017-04-29: 10 meq via INTRAVENOUS
  Filled 2017-04-29 (×2): qty 100

## 2017-04-29 MED ORDER — POTASSIUM CHLORIDE CRYS ER 20 MEQ PO TBCR
40.0000 meq | EXTENDED_RELEASE_TABLET | ORAL | Status: AC
  Administered 2017-04-29 (×2): 40 meq via ORAL
  Filled 2017-04-29 (×2): qty 2

## 2017-04-29 NOTE — Progress Notes (Signed)
  Echocardiogram 2D Echocardiogram has been performed.  Celene SkeenVijay  Krishauna Schatzman 04/29/2017, 9:07 AM

## 2017-04-29 NOTE — Progress Notes (Signed)
PROGRESS NOTE  Alexandra Richardson ZOX:[id number] DOB: [birth year] DOA: 04/28/2017 PCP: Ailene Ravel, MD   Brief Summary:  Patient is brought to ED on 3/22 by EMS due to syncope. She reports she opened her front door for a delivery 3/22 morning, she fell forward and was caught by the delivery driver. She reports that she is unsure if she passed out. She states that she run out of xanax two weeks ago, she think she  is withdrawing from Xanax and thinks that her weakness and syncope are a result. She also had 1 syncopal episode yesterday. She denies any head injury or neck pain. A&Ox4 in triage  She is admitted for syncope work up, she is found to have hyponatremia , hypokalemia, aki   HPI/Recap of past 24 hours:  Feeling better  Assessment/Plan: Principal Problem:   Near syncope Active Problems:   Depression   Anxiety   Dehydration with hyponatremia  Syncope:  -likely due to orthostatic hypotension. Dehydration. -she denies chest pain, no sob, no hypoxia -Ct head negative for acute findings, ekg no acute findings, tele unremarkable -uds negative -echo pending -Review chart she has very low random cortisol level in the past, will check cosyntropin stim test, continue hydration.   Hyponatremia Sodium 124, improving on IV fluids, today 128.  Continue IV fluids. Hold Lasix Amitriptyline, Lexapro, Risperdal listed on her as her home meds, this could contribute to hyponatremia as well.  Hypokalemia/hypomagnesemia: replace K/mag, repeat lab in am  Leukocytosis likely from dehydration no sign of infection UA no bacteria, chest x-ray no acute findings  AKI Cr Improving on IV fluids  Falls at home , PT eval, fall precaution  Code Status: full  Family Communication: patient   Disposition Plan: pending clinical improvement and Pt eval   Consultants:  none  Procedures:  none  Antibiotics:  none   Objective: BP 120/67 (BP Location: Left Arm)   Pulse (!) 108    Temp 98.5 F (36.9 C) (Oral)   Resp 18   Ht 5\' 4"  (1.626 m)   Wt 70.5 kg (155 lb 6.8 oz)   SpO2 94%   BMI 26.68 kg/m   Intake/Output Summary (Last 24 hours) at 04/29/2017 1003 Last data filed at 04/29/2017 8119 Gross per 24 hour  Intake 2522.92 ml  Output 500 ml  Net 2022.92 ml   Filed Weights   04/28/17 1445 04/28/17 2029  Weight: 75.8 kg (167 lb) 70.5 kg (155 lb 6.8 oz)    Exam: Patient is examined daily including today on 04/29/2017, exams remain the same as of yesterday except that has changed    General:  NAD, chronically ill appearing, frail,   Cardiovascular: RRR  Respiratory: CTABL  Abdomen: Soft/ND/NT, positive BS  Musculoskeletal: No Edema  Neuro: alert, oriented ( not oriented to months, report chronic), report poor memory  Data Reviewed: Basic Metabolic Panel: Recent Labs  Lab 04/28/17 1501 04/29/17 0505 04/29/17 0507  NA 124*  --  128*  K 3.6  --  3.0*  CL 81*  --  89*  CO2 29  --  28  GLUCOSE 124*  --  144*  BUN 29*  --  23*  CREATININE 1.18*  --  0.96  CALCIUM 10.3  --  9.4  MG  --  1.8  --    Liver Function Tests: Recent Labs  Lab 04/28/17 1601  AST 27  ALT 26  ALKPHOS 25*  BILITOT 1.0  PROT 7.5  ALBUMIN 4.1  Recent Labs  Lab 04/28/17 1601  LIPASE 40   No results for input(s): AMMONIA in the last 168 hours. CBC: Recent Labs  Lab 04/28/17 1501  WBC 10.6*  HGB 18.3*  HCT 49.4*  MCV 84.3  PLT 254   Cardiac Enzymes:   No results for input(s): CKTOTAL, CKMB, CKMBINDEX, TROPONINI in the last 168 hours. BNP (last 3 results) No results for input(s): BNP in the last 8760 hours.  ProBNP (last 3 results) No results for input(s): PROBNP in the last 8760 hours.  CBG: Recent Labs  Lab 04/28/17 1504  GLUCAP 131*    No results found for this or any previous visit (from the past 240 hour(s)).   Studies: Dg Chest 2 View  Result Date: 04/28/2017 CLINICAL DATA:  Weakness for 2-3 days. EXAM: CHEST - 2 VIEW COMPARISON:  PA  and lateral chest 02/22/2013 and 04/19/2010. FINDINGS: The lungs are clear. Heart size is normal. No pneumothorax or pleural effusion. No acute bony abnormality. IMPRESSION: No acute disease. Electronically Signed   By: Drusilla Kannerhomas  Dalessio M.D.   On: 04/28/2017 15:47   Ct Head Wo Contrast  Result Date: 04/28/2017 CLINICAL DATA:  67 year old female with syncope versus presyncope at home this morning. EXAM: CT HEAD WITHOUT CONTRAST TECHNIQUE: Contiguous axial images were obtained from the base of the skull through the vertex without intravenous contrast. COMPARISON:  Brain MRI 11/17/2009. Head CT without contrast 11/15/2009. FINDINGS: Brain: Cerebral volume remains normal for age. Cavum septum pellucidum (normal variant). No midline shift, ventriculomegaly, mass effect, evidence of mass lesion, intracranial hemorrhage or evidence of cortically based acute infarction. Scattered mild for age cerebral white matter hypodensity appears increased since 2011. No cortical encephalomalacia identified. Vascular: Calcified atherosclerosis at the skull base. No suspicious intracranial vascular hyperdensity. Skull: Stable and negative. Sinuses/Orbits: Clear. Other: Visualized orbit soft tissues are within normal limits. Visualized scalp soft tissues are within normal limits. IMPRESSION: 1.  No acute intracranial abnormality. 2. Mild for age nonspecific white matter changes appear progressed since 2011, most commonly due to small vessel disease. Electronically Signed   By: Odessa FlemingH  Hall M.D.   On: 04/28/2017 15:59    Scheduled Meds: . amitriptyline  150 mg Oral Daily  . atorvastatin  40 mg Oral Daily  . enoxaparin (LOVENOX) injection  40 mg Subcutaneous Q24H  . escitalopram  40 mg Oral Daily  . nicotine  21 mg Transdermal Daily  . pantoprazole  40 mg Oral BID    Continuous Infusions: . sodium chloride 125 mL/hr at 04/29/17 0517  . potassium chloride 10 mEq (04/29/17 [id number])     Time spent: 25mins I have personally  reviewed and interpreted on  04/29/2017 daily labs, tele strips, imagings as discussed above under date review session and assessment and plans.  I reviewed all nursing notes,  vitals, pertinent old records  I have discussed plan of care as described above with RN , patient  on 04/29/2017   Albertine GratesFang Talasia Saulter MD, PhD  [facility] Pager [phone]. If 7PM-7AM, please contact night-coverage at [email or web address], password [email or web address] 04/29/2017, 10:03 AM  LOS: 0 days

## 2017-04-30 ENCOUNTER — Observation Stay (HOSPITAL_COMMUNITY): Payer: Medicare Other

## 2017-04-30 DIAGNOSIS — R55 Syncope and collapse: Secondary | ICD-10-CM | POA: Diagnosis not present

## 2017-04-30 LAB — CBC WITH DIFFERENTIAL/PLATELET
Basophils Absolute: 0.1 10*3/uL (ref 0.0–0.1)
Basophils Relative: 1 %
Eosinophils Absolute: 0.2 10*3/uL (ref 0.0–0.7)
Eosinophils Relative: 3 %
HCT: 43.2 % (ref 36.0–46.0)
Hemoglobin: 14.4 g/dL (ref 12.0–15.0)
Lymphocytes Relative: 39 %
Lymphs Abs: 3.3 10*3/uL (ref 0.7–4.0)
MCH: 29.7 pg (ref 26.0–34.0)
MCHC: 33.3 g/dL (ref 30.0–36.0)
MCV: 89.1 fL (ref 78.0–100.0)
Monocytes Absolute: 0.8 10*3/uL (ref 0.1–1.0)
Monocytes Relative: 9 %
Neutro Abs: 4.1 10*3/uL (ref 1.7–7.7)
Neutrophils Relative %: 48 %
Platelets: 218 10*3/uL (ref 150–400)
RBC: 4.85 MIL/uL (ref 3.87–5.11)
RDW: 14.2 % (ref 11.5–15.5)
WBC: 8.5 10*3/uL (ref 4.0–10.5)

## 2017-04-30 LAB — HEMOGLOBIN A1C
Hgb A1c MFr Bld: 5.6 % (ref 4.8–5.6)
Mean Plasma Glucose: 114.02 mg/dL

## 2017-04-30 LAB — TSH: TSH: 1.114 u[IU]/mL (ref 0.350–4.500)

## 2017-04-30 LAB — BASIC METABOLIC PANEL
Anion gap: 8 (ref 5–15)
BUN: 15 mg/dL (ref 6–20)
CO2: 27 mmol/L (ref 22–32)
Calcium: 8.8 mg/dL — ABNORMAL LOW (ref 8.9–10.3)
Chloride: 95 mmol/L — ABNORMAL LOW (ref 101–111)
Creatinine, Ser: 0.81 mg/dL (ref 0.44–1.00)
GFR calc Af Amer: >60 mL/min (ref >60)
GFR calc non Af Amer: >60 mL/min (ref >60)
Glucose, Bld: 114 mg/dL — ABNORMAL HIGH (ref 65–99)
Potassium: 4.1 mmol/L (ref 3.5–5.1)
Sodium: 130 mmol/L — ABNORMAL LOW (ref 135–145)

## 2017-04-30 LAB — MAGNESIUM: Magnesium: 1.2 mg/dL — ABNORMAL LOW (ref 1.7–2.4)

## 2017-04-30 MED ORDER — GUAIFENESIN ER 600 MG PO TB12
600.0000 mg | ORAL_TABLET | Freq: Two times a day (BID) | ORAL | Status: DC
  Administered 2017-04-30 – 2017-05-01 (×3): 600 mg via ORAL
  Filled 2017-04-30 (×3): qty 1

## 2017-04-30 MED ORDER — COSYNTROPIN 0.25 MG IJ SOLR
0.2500 mg | Freq: Once | INTRAMUSCULAR | Status: AC
  Administered 2017-05-01: 0.25 mg via INTRAVENOUS
  Filled 2017-04-30: qty 0.25

## 2017-04-30 MED ORDER — KETOROLAC TROMETHAMINE 15 MG/ML IJ SOLN
7.5000 mg | Freq: Once | INTRAMUSCULAR | Status: AC
  Administered 2017-04-30: 7.5 mg via INTRAVENOUS
  Filled 2017-04-30: qty 1

## 2017-04-30 MED ORDER — DIPHENHYDRAMINE HCL 50 MG/ML IJ SOLN
25.0000 mg | Freq: Once | INTRAMUSCULAR | Status: AC
  Administered 2017-05-01: 25 mg via INTRAVENOUS
  Filled 2017-04-30: qty 1

## 2017-04-30 MED ORDER — KETOROLAC TROMETHAMINE 30 MG/ML IJ SOLN
30.0000 mg | Freq: Once | INTRAMUSCULAR | Status: AC
  Administered 2017-05-01: 30 mg via INTRAVENOUS
  Filled 2017-04-30: qty 1

## 2017-04-30 MED ORDER — PROCHLORPERAZINE EDISYLATE 5 MG/ML IJ SOLN
10.0000 mg | Freq: Once | INTRAMUSCULAR | Status: AC
  Administered 2017-05-01: 10 mg via INTRAVENOUS
  Filled 2017-04-30: qty 2

## 2017-04-30 MED ORDER — MAGNESIUM SULFATE 4 GM/100ML IV SOLN
4.0000 g | Freq: Once | INTRAVENOUS | Status: AC
  Administered 2017-04-30: 4 g via INTRAVENOUS
  Filled 2017-04-30: qty 100

## 2017-04-30 MED ORDER — FLUTICASONE PROPIONATE 50 MCG/ACT NA SUSP
2.0000 | Freq: Every day | NASAL | Status: DC
  Administered 2017-04-30 – 2017-05-01 (×2): 2 via NASAL
  Filled 2017-04-30: qty 16

## 2017-04-30 NOTE — NC FL2 (Signed)
[place] MEDICAID FL2 LEVEL OF CARE SCREENING TOOL     IDENTIFICATION  Patient Name: Alexandra Richardson Birthdate: Aug 09, 1950 Sex: female Admission Date (Current Location): 04/28/2017  [facility] and [place] Number:  [facility]   Facility and Address:  [facility],  [address] [place] [address]      Provider Number: [id number]  Attending Physician Name and Address:  Albertine Grates, MD  Relative Name and Phone Number:       Current Level of Care: Hospital Recommended Level of Care: Skilled Nursing Facility Prior Approval Number:    Date Approved/Denied:   PASRR Number: Manual review  Discharge Plan: SNF    Current Diagnoses: Patient Active Problem List   Diagnosis Date Noted  . Near syncope 04/28/2017  . Dehydration with hyponatremia 04/28/2017  . Pneumonia 02/07/2013  . Hypokalemia 07/03/2011  . Grief 07/03/2011  . Encephalopathy 07/02/2011  . Hyperlipidemia 07/02/2011  . Anxiety 07/02/2011  . Overdose 07/01/2011  . Altered mental status 07/01/2011  . Hypertension 07/01/2011  . Depression 07/01/2011  . UTI (lower urinary tract infection) 07/01/2011    Orientation RESPIRATION BLADDER Height & Weight     Self, Time, Situation, Place  Normal Incontinent, Continent(incontinent at times) Weight: 155 lb 6.8 oz (70.5 kg) Height:  5\' 4"  (162.6 cm)  BEHAVIORAL SYMPTOMS/MOOD NEUROLOGICAL BOWEL NUTRITION STATUS      Continent Diet(heart healthy)  AMBULATORY STATUS COMMUNICATION OF NEEDS Skin   Limited Assist Verbally Skin abrasions                       Personal Care Assistance Level of Assistance  Bathing, Feeding, Dressing Bathing Assistance: Limited assistance Feeding assistance: Independent Dressing Assistance: Limited assistance     Functional Limitations Info  Sight, Hearing, Speech Sight Info: Adequate Hearing Info: Adequate Speech Info: Adequate    SPECIAL CARE FACTORS FREQUENCY  OT (By licensed OT), PT (By licensed PT)     PT  Frequency: 5x/wk OT Frequency: 5x/wk            Contractures Contractures Info: Not present    Additional Factors Info  Code Status, Allergies, Psychotropic Code Status Info: Full Allergies Info: Bee Venom, Codeine Psychotropic Info: Lexapro 40mg  daily         Current Medications (04/30/2017):  This is the current hospital active medication list Current Facility-Administered Medications  Medication Dose Route Frequency Provider Last Rate Last Dose  . acetaminophen (TYLENOL) tablet 650 mg  650 mg Oral Q6H PRN Hillary Bow, DO   650 mg at 04/30/17 8119   Or  . acetaminophen (TYLENOL) suppository 650 mg  650 mg Rectal Q6H PRN Hillary Bow, DO      . albuterol (PROVENTIL) (2.5 MG/3ML) 0.083% nebulizer solution 2.5 mg  2.5 mg Nebulization Q4H PRN Hillary Bow, DO      . amitriptyline (ELAVIL) tablet 150 mg  150 mg Oral Daily Lyda Perone M, DO   150 mg at 04/30/17 1478  . atorvastatin (LIPITOR) tablet 40 mg  40 mg Oral Daily Lyda Perone M, DO   40 mg at 04/30/17 2956  . [START ON 05/01/2017] cosyntropin (CORTROSYN) injection 0.25 mg  0.25 mg Intravenous Once Albertine Grates, MD      . enoxaparin (LOVENOX) injection 40 mg  40 mg Subcutaneous Q24H Lyda Perone M, DO   40 mg at 04/29/17 2038  . escitalopram (LEXAPRO) tablet 40 mg  40 mg Oral Daily Lyda Perone M, DO   40 mg  at 04/30/17 0953  . fluticasone (FLONASE) 50 MCG/ACT nasal spray 2 spray  2 spray Each Nare Daily Albertine GratesXu, Fang, MD   2 spray at 04/30/17 1204  . guaiFENesin (MUCINEX) 12 hr tablet 600 mg  600 mg Oral BID Albertine GratesXu, Fang, MD   600 mg at 04/30/17 [id number]  . nicotine (NICODERM CQ - dosed in mg/24 hours) patch 21 mg  21 mg Transdermal Daily Lyda PeroneGardner, Jared M, DO   21 mg at 04/30/17 0953  . ondansetron (ZOFRAN) tablet 4 mg  4 mg Oral Q6H PRN Hillary BowGardner, Jared M, DO       Or  . ondansetron [facility](ZOFRAN) injection 4 mg  4 mg Intravenous Q6H PRN Hillary BowGardner, Jared M, DO      . pantoprazole (PROTONIX) EC tablet 40 mg  40 mg Oral BID Lyda PeroneGardner,  Jared M, DO   40 mg at 04/30/17 [id number]     Discharge Medications: Please see discharge summary for a list of discharge medications.  Relevant Imaging Results:  Relevant Lab Results:   Additional Information SS#: [id number]  Baldemar LenisElizabeth M Samhitha Rosen, LCSW

## 2017-04-30 NOTE — Clinical Social Work Note (Signed)
Clinical Social Work Assessment  Patient Details  Name: Alexandra Richardson MRN: [id number] Date of Birth: March 09, 1950  Date of referral:  04/30/17               Reason for consult:  Facility Placement                Permission sought to share information with:  Facility Art therapist granted to share information::  Yes, Verbal Permission Granted  Name::        Agency::  SNF  Relationship::     Contact Information:     Housing/Transportation Living arrangements for the past 2 months:  Single Family Home Source of Information:  Patient Patient Interpreter Needed:  None Criminal Activity/Legal Involvement Pertinent to Current Situation/Hospitalization:  No - Comment as needed Significant Relationships:  Siblings, Adult Children Lives with:  Self Do you feel safe going back to the place where you live?  Yes Need for family participation in patient care:  No (Coment)  Care giving concerns:  Patient lives home alone and requires assistance with mobility at this time. Patient will benefit from short term rehab to improve ability to independently care for self before returning home.   Social Worker assessment / plan:  CSW met with patient to discuss recommendation for SNF at discharge. CSW discussed expectations and referral process with patient, received permission to fax out referral. CSW to follow up with patient on bed offers.  Employment status:  Retired Nurse, adult PT Recommendations:  [place] / Referral to community resources:  [place]  Patient/Family's Response to care:  Patient agreeable to SNF placement.  Patient/Family's Understanding of and Emotional Response to Diagnosis, Current Treatment, and Prognosis:  Patient hopeful that she can stay in rehab close to [facility], just in case she has anything else happen and she needs to come back for evaluation. Patient discussed how  she's never been to rehab before but hopes that it helps her feel more confident going home on her own.   Emotional Assessment Appearance:  Appears stated age Attitude/Demeanor/Rapport:  Engaged Affect (typically observed):  Pleasant Orientation:  Oriented to Self, Oriented to Place, Oriented to  Time, Oriented to Situation Alcohol / Substance use:  Not Applicable Psych involvement (Current and /or in the community):  No (Comment)  Discharge Needs  Concerns to be addressed:  Care Coordination Readmission within the last 30 days:  No Current discharge risk:  Lives alone, Dependent with Mobility Barriers to Discharge:  Continued Medical Work up, Ship broker, Programmer, applications ([place])   Geralynn Ochs, LCSW 04/30/2017, 3:21 PM

## 2017-04-30 NOTE — Progress Notes (Signed)
PROGRESS NOTE  KAILY WRAGG ZOX:[id number] DOB: Nov 16, 1950 DOA: 04/28/2017 PCP: Ailene Ravel, MD   Brief Summary:  Patient is brought to ED on 3/22 by EMS due to syncope. She reports she opened her front door for a delivery 3/22 morning, she fell forward and was caught by the delivery driver. She reports that she is unsure if she passed out. She states that she run out of xanax two weeks ago, she think she  is withdrawing from Xanax and thinks that her weakness and syncope are a result. She also had 1 syncopal episode yesterday. She denies any head injury or neck pain. A&Ox4 in triage  She is admitted for syncope work up, she is found to have hyponatremia , hypokalemia, aki   HPI/Recap of past 24 hours:  C/o frontal headache and sinus congestion Still dizzy standing up but improving C/o chronic right knee pain No fever, no cough  Acth stim test was not done right,  Assessment/Plan: Principal Problem:   Near syncope Active Problems:   Depression   Anxiety   Dehydration with hyponatremia  Syncope:  -likely due to orthostatic hypotension. Dehydration. -she denies chest pain, no sob, no hypoxia -Ct head negative for acute findings, ekg no acute findings, tele unremarkable -uds negative -tele unremarkable, echo lvef wnl, grade 1 diastolic dysfunction -Review chart she has very low random cortisol level in the past, will check cosyntropin stim test -she received hydration, improving, d/c ivf, encourage oral intake   Hyponatremia Sodium 124 on presentation, sodium improved to 130 today, d/c ivf. Encourage oral intake. Hold Lasix Amitriptyline, Lexapro, Risperdal listed on her as her home meds, this could contribute to hyponatremia as well. She reports she has been on these meds for years and she think these meds are helping her.  Hypokalemia/hypomagnesemia:  k normalized Mag remain low, continue replace   repeat lab in am  Leukocytosis likely from dehydration no  sign of infection UA no bacteria, chest x-ray no acute findings normalized  AKI Bun 29/cr 1.18 on presentation Bun 15/cr 0.8 after hydration D/c ivf Repeat lab in am  Right knee pain, right knee x ray Check uric acid  Frontal headache with sinus congestion:  Start flonase/mucinex Iv toradol x1  Falls at home , PT eval, fall precaution  Code Status: full  Family Communication: patient   Disposition Plan: pending clinical improvement and Pt eval Possible d/c on 3/25   Consultants:  none  Procedures:  none  Antibiotics:  none   Objective: BP (!) 172/95 (BP Location: Right Arm)   Pulse 88   Temp 98.7 F (37.1 C) (Oral)   Resp 20   Ht 5\' 4"  (1.626 m)   Wt 70.5 kg (155 lb 6.8 oz)   SpO2 93%   BMI 26.68 kg/m   Intake/Output Summary (Last 24 hours) at 04/30/2017 0753 Last data filed at 04/30/2017 0419 Gross per 24 hour  Intake 3820 ml  Output 2700 ml  Net 1120 ml   Filed Weights   04/28/17 1445 04/28/17 2029  Weight: 75.8 kg (167 lb) 70.5 kg (155 lb 6.8 oz)    Exam: Patient is examined daily including today on 04/30/2017, exams remain the same as of yesterday except that has changed    General:  NAD, chronically ill appearing, frail,   Cardiovascular: RRR  Respiratory: CTABL  Abdomen: Soft/ND/NT, positive BS  Musculoskeletal: No Edema  Neuro: alert, oriented ( not oriented to months, report chronic), report poor memory  Data Reviewed: Basic  Metabolic Panel: Recent Labs  Lab 04/28/17 1501 04/29/17 0505 04/29/17 0507 04/30/17 0505  NA 124*  --  128* 130*  K 3.6  --  3.0* 4.1  CL 81*  --  89* 95*  CO2 29  --  28 27  GLUCOSE 124*  --  144* 114*  BUN 29*  --  23* 15  CREATININE 1.18*  --  0.96 0.81  CALCIUM 10.3  --  9.4 8.8*  MG  --  1.8  --  1.2*   Liver Function Tests: Recent Labs  Lab 04/28/17 1601  AST 27  ALT 26  ALKPHOS 25*  BILITOT 1.0  PROT 7.5  ALBUMIN 4.1   Recent Labs  Lab 04/28/17 1601  LIPASE 40   No  results for input(s): AMMONIA in the last 168 hours. CBC: Recent Labs  Lab 04/28/17 1501 04/30/17 0505  WBC 10.6* 8.5  NEUTROABS  --  4.1  HGB 18.3* 14.4  HCT 49.4* 43.2  MCV 84.3 89.1  PLT 254 218   Cardiac Enzymes:   No results for input(s): CKTOTAL, CKMB, CKMBINDEX, TROPONINI in the last 168 hours. BNP (last 3 results) No results for input(s): BNP in the last 8760 hours.  ProBNP (last 3 results) No results for input(s): PROBNP in the last 8760 hours.  CBG: Recent Labs  Lab 04/28/17 1504  GLUCAP 131*    No results found for this or any previous visit (from the past 240 hour(s)).   Studies: No results found.  Scheduled Meds: . amitriptyline  150 mg Oral Daily  . atorvastatin  40 mg Oral Daily  . enoxaparin (LOVENOX) injection  40 mg Subcutaneous Q24H  . escitalopram  40 mg Oral Daily  . nicotine  21 mg Transdermal Daily  . pantoprazole  40 mg Oral BID    Continuous Infusions: . magnesium sulfate 1 - 4 g bolus IVPB       Time spent: 25mins I have personally reviewed and interpreted on  04/30/2017 daily labs, tele strips, imagings as discussed above under date review session and assessment and plans.  I reviewed all nursing notes,  vitals, pertinent old records  I have discussed plan of care as described above with RN , patient  on 04/30/2017   Albertine GratesFang Demaryius Imran MD, PhD  [facility] Pager [phone]. If 7PM-7AM, please contact night-coverage at [email or web address], password [email or web address] 04/30/2017, 7:53 AM  LOS: 0 days

## 2017-04-30 NOTE — Evaluation (Signed)
Physical Therapy Evaluation Patient Details Name: Alexandra Richardson MRN: [id number] DOB: Jan 09, 1951 Today's Date: 04/30/2017   History of Present Illness  Pt is a 67 year old female with hx of arthritis and anxiety and admitted for syncope work up and found to have hyponatremia , hypokalemia, AKI  Clinical Impression  Pt admitted with above diagnosis. Pt currently with functional limitations due to the deficits listed below (see PT Problem List).  Pt will benefit from skilled PT to increase their independence and safety with mobility to allow discharge to the venue listed below.  Per chart review, orthostatics obtained this morning.  Pt assisted with ambulating short distance in hallway.  Pt reports frequent falls at home due to syncope and dizziness and lives alone.  Pt agreeable to SNF upon d/c if able to be arranged.     Follow Up Recommendations SNF;Supervision/Assistance - 24 hour    Equipment Recommendations  None recommended by PT    Recommendations for Other Services       Precautions / Restrictions Precautions Precautions: Fall Precaution Comments: orthostatic hypotension Restrictions Weight Bearing Restrictions: No      Mobility  Bed Mobility Overal bed mobility: Needs Assistance Bed Mobility: Supine to Sit;Sit to Supine     Supine to sit: Supervision;HOB elevated Sit to supine: Supervision   General bed mobility comments: increased time and effort  Transfers Overall transfer level: Needs assistance Equipment used: Rolling walker (2 wheeled) Transfers: Sit to/from Stand Sit to Stand: Min assist         General transfer comment: assist to rise and steady as well as control descent  Ambulation/Gait Ambulation/Gait assistance: Min assist Ambulation Distance (Feet): 80 Feet Assistive device: Rolling walker (2 wheeled) Gait Pattern/deviations: Step-to pattern;Decreased stance time - right;Antalgic     General Gait Details: verbal cues for safe technique,  pt initially took a few steps forwards and then backwards to bed due to increased dizziness, after short rest break pt able to ambulate into hallway, pt reports pain in right knee, distance limited by slight dizziness and fatigue  Stairs            Wheelchair Mobility    Modified Rankin (Stroke Patients Only)       Balance Overall balance assessment: Mild deficits observed, not formally tested;History of Falls                                           Pertinent Vitals/Pain Pain Assessment: 0-10 Pain Score: 6  Pain Location: headache Pain Descriptors / Indicators: Aching Pain Intervention(s): Premedicated before session;Monitored during session;Repositioned    Home Living Family/patient expects to be discharged to:: Private residence Living Arrangements: Alone Available Help at Discharge: Family;Available PRN/intermittently Type of Home: Mobile home Home Access: Stairs to enter Entrance Stairs-Rails: Right Entrance Stairs-Number of Steps: 5 Home Layout: One level Home Equipment: Walker - 2 wheels      Prior Function Level of Independence: Independent               Hand Dominance        Extremity/Trunk Assessment        Lower Extremity Assessment Lower Extremity Assessment: Generalized weakness;RLE deficits/detail RLE Deficits / Details: reports chronic R knee pain, functional AROM per observation       Communication   Communication: No difficulties  Cognition Arousal/Alertness: Awake/alert Behavior During Therapy: WFL for tasks assessed/performed Overall  Cognitive Status: Within Functional Limits for tasks assessed                                        General Comments      Exercises     Assessment/Plan    PT Assessment Patient needs continued PT services  PT Problem List Decreased strength;Decreased mobility;Decreased activity tolerance;Decreased balance;Decreased knowledge of use of DME;Decreased  knowledge of precautions       PT Treatment Interventions DME instruction;Therapeutic activities;Gait training;Therapeutic exercise;Patient/family education;Functional mobility training;Balance training;Stair training    PT Goals (Current goals can be found in the Care Plan section)  Acute Rehab PT Goals PT Goal Formulation: With patient Time For Goal Achievement: 05/07/17 Potential to Achieve Goals: Good    Frequency Min 2X/week   Barriers to discharge        Co-evaluation               AM-PAC PT "6 Clicks" Daily Activity  Outcome Measure Difficulty turning over in bed (including adjusting bedclothes, sheets and blankets)?: A Little Difficulty moving from lying on back to sitting on the side of the bed? : A Lot Difficulty sitting down on and standing up from a chair with arms (e.g., wheelchair, bedside commode, etc,.)?: Unable Help needed moving to and from a bed to chair (including a wheelchair)?: A Little Help needed walking in hospital room?: A Little Help needed climbing 3-5 steps with a railing? : A Lot 6 Click Score: 14    End of Session Equipment Utilized During Treatment: Gait belt Activity Tolerance: Patient tolerated treatment well Patient left: in bed;with call bell/phone within reach;with bed alarm set Nurse Communication: Mobility status PT Visit Diagnosis: Other abnormalities of gait and mobility (R26.89)    Time: [id number] PT Time Calculation (min) (ACUTE ONLY): 13 min   Charges:   PT Evaluation $PT Eval Low Complexity: 1 Low     PT G CodesZenovia Jarred:        [name], PT, DPT 04/30/2017 Pager: [phone]  Maida SaleLEMYRE,KATHrine E 04/30/2017, 12:11 PM

## 2017-05-01 DIAGNOSIS — R55 Syncope and collapse: Secondary | ICD-10-CM | POA: Diagnosis not present

## 2017-05-01 LAB — BASIC METABOLIC PANEL
Anion gap: 8 (ref 5–15)
BUN: 9 mg/dL (ref 6–20)
CO2: 27 mmol/L (ref 22–32)
Calcium: 9 mg/dL (ref 8.9–10.3)
Chloride: 94 mmol/L — ABNORMAL LOW (ref 101–111)
Creatinine, Ser: 0.73 mg/dL (ref 0.44–1.00)
GFR calc Af Amer: >60 mL/min (ref >60)
GFR calc non Af Amer: >60 mL/min (ref >60)
Glucose, Bld: 96 mg/dL (ref 65–99)
Potassium: 4.1 mmol/L (ref 3.5–5.1)
Sodium: 129 mmol/L — ABNORMAL LOW (ref 135–145)

## 2017-05-01 LAB — MAGNESIUM: Magnesium: 1.4 mg/dL — ABNORMAL LOW (ref 1.7–2.4)

## 2017-05-01 LAB — URIC ACID: Uric Acid, Serum: 5.1 mg/dL (ref 2.3–6.6)

## 2017-05-01 MED ORDER — MAGNESIUM SULFATE 4 GM/100ML IV SOLN
4.0000 g | Freq: Once | INTRAVENOUS | Status: AC
  Administered 2017-05-01: 4 g via INTRAVENOUS
  Filled 2017-05-01: qty 100

## 2017-05-01 MED ORDER — AMOXICILLIN-POT CLAVULANATE 875-125 MG PO TABS: 1.0000 | ORAL_TABLET | Freq: Two times a day (BID) | ORAL | Status: DC

## 2017-05-01 MED ORDER — FUROSEMIDE 40 MG PO TABS: 40.0000 mg | ORAL_TABLET | Freq: Every day | ORAL | 0 refills | Status: DC | PRN

## 2017-05-01 MED ORDER — HYDROXYZINE HCL 10 MG PO TABS: 10.0000 mg | ORAL_TABLET | Freq: Three times a day (TID) | ORAL | 0 refills | Status: DC | PRN

## 2017-05-01 MED ORDER — GABAPENTIN 600 MG PO TABS: 600.0000 mg | ORAL_TABLET | Freq: Every day | ORAL | 0 refills | Status: AC

## 2017-05-01 MED ORDER — GUAIFENESIN ER 600 MG PO TB12: 600.0000 mg | ORAL_TABLET | Freq: Two times a day (BID) | ORAL | 0 refills | Status: DC

## 2017-05-01 MED ORDER — AMOXICILLIN-POT CLAVULANATE 875-125 MG PO TABS: 1.0000 | ORAL_TABLET | Freq: Two times a day (BID) | ORAL | 0 refills | Status: AC

## 2017-05-01 MED ORDER — KETOROLAC TROMETHAMINE 15 MG/ML IJ SOLN: 7.5000 mg | Freq: Once | INTRAMUSCULAR | Status: DC

## 2017-05-01 MED ORDER — FLUTICASONE PROPIONATE 50 MCG/ACT NA SUSP: 2.0000 | Freq: Every day | NASAL | 2 refills | Status: AC

## 2017-05-01 NOTE — Care Management Note (Signed)
Case Management Note  Patient Details  Name: Alexandra BlamerDorothy L Carne MRN: [id number] Date of Birth: 1950/07/28  Subjective/Objective:  Patient declined SNF. Chose [facility] for HHC-HHTP/OT/aide,sw. Has rw,cane. Lives w/sister. Has own transp home. No further CM needs.                  Action/Plan:d/c home w/HHC.   Expected Discharge Date:  (sister)               Expected Discharge Plan:  Home w Home Health Services  In-House Referral:     Discharge planning Services  CM Consult  Post Acute Care Choice:  Durable Medical Equipment(rw,cane) Choice offered to:  Patient  DME Arranged:    DME Agency:  [facility]  HH Arranged:  PT, OT, Nurse's Aide, Social Work [employer] Agency:  [facility]  Status of Service:  Completed, signed off  If discussed at Microsoft[employer] Length of [employer] CompanyStay Meetings, dates discussed:    Additional Comments:  Lanier ClamMahabir, Gerrell Tabet, RN 05/01/2017, 12:19 PM

## 2017-05-01 NOTE — Progress Notes (Signed)
CSW followed up with patient regarding SNF placement and provided bed offers. Patient reported that she is no longer interested in SNF and is agreeable to home health services. CSW agreed to notify patient's RNCM. CSW notified patient of consult for transportation needs and inquired about patient's current mode of transportation. Patient reported that her sister transports her but that she would like resources. CSW provided patient with [facility] resource and Music therapistTAMS (Transportation and Mobility Services)  Resource. CSW encouraged patient to follow up with resources provided. CSW inquired if patient needed any other resources, patient reported none.   CSW updated patient's RNCM about patient's interest in home health services.  CSW signing off, no other needs identified at this time.  Celso SickleKimberly Keelynn Richardson, ConnecticutLCSWA Clinical Social Worker [facility] Cell#: [phone]

## 2017-05-01 NOTE — Progress Notes (Signed)
Brief Nutrition Note:   Patient identified using the Malnutrition Screening Tool (MST).   67 yo female admitted 3/22 due to near syncope in the setting of dehydration with hyponatremia. Active problems of depression, anxiety, HTN, hyperlipidemia. Per MD note, pt acute status is improved and social work/ care management are coordinating discharge to SNF.  The patient reports distant weight loss PTA a couple years ago, but could not recall details of weight history. She stated that she had some decreased appetite due to recent illness, but endorses a good appetite currently and a good appetite PTA. She likes to eat spaghetti, pork chops, fried chicken, and snacks a lot during the day on potato chips/dip.  Diet Order:  Diet Heart Room service appropriate? Yes; Fluid consistency: Thin  BMI:  Body mass index is 26.68 kg/m. Classified as Overweight  Eating 75-100% meals in hospital.    Weight history from the medical record.  Wt Readings from Last 15 Encounters:  04/28/17 155 lb 6.8 oz (70.5 kg)  02/22/13 192 lb (87.1 kg)  02/12/13 191 lb 2.2 oz (86.7 kg)  07/02/11 194 lb 0.1 oz (88 kg)    No further nutrition interventions warranted at this time. Please consult RD if future nutrition needs arise.     Marjie Skiffherie N. Dalin Caldera Dietetic Intern [phone]

## 2017-05-01 NOTE — Care Management Note (Signed)
Case Management Note  Patient Details  Name: Alexandra BlamerDorothy L Dolder MRN: [id number] Date of Birth: 1950-04-27  Subjective/Objective:patient has stated she knows when to take her meds,& when to call the doctor-she states she does not need a HHc nurse,furthermore-the HHC agency does not have a nurse available-MD notified.                    Action/Plan:d/c home w/HHC.   Expected Discharge Date:  05/01/17               Expected Discharge Plan:  Home w Home Health Services  In-House Referral:     Discharge planning Services  CM Consult  Post Acute Care Choice:  Durable Medical Equipment(rw,cane) Choice offered to:  Patient  DME Arranged:    DME Agency:  [facility]  HH Arranged:  PT, OT, Nurse's Aide, Social Work [employer]  Status of Service:  Completed, signed off  If discussed at [employer]Long Length of [employer]Stay Meetings, dates discussed:    Additional Comments:  Lanier ClamMahabir, Jaylynn Mcaleer, RN 05/01/2017, 1:53 PM

## 2017-05-01 NOTE — Progress Notes (Signed)
Pt to be discharged to home this afternoon Pt given discharge instructions including medication and schedules. Understanding verbalized. Discharge packet with Pt at time of discharge.

## 2017-05-01 NOTE — Discharge Summary (Signed)
Discharge Summary  Alexandra BlamerDorothy L Richardson ZOX:[id number] DOB: 1950/08/21  PCP: Ailene RavelHamrick, Maura L, MD  Admit date: 04/28/2017 Discharge date: 05/01/2017  Time spent: >7130mins, more than 50% time spent on coordination of care. Patient declined snf, home health arranged  Recommendations for Outpatient Follow-up:  1. F/u with PMD within a week  for hospital discharge follow up, repeat cbc/bmp at follow up  Discharge Diagnoses:  Active Hospital Problems   Diagnosis Date Noted  . Near syncope 04/28/2017  . Dehydration with hyponatremia 04/28/2017  . Anxiety 07/02/2011  . Depression 07/01/2011    Resolved Hospital Problems  No resolved problems to display.    Discharge Condition: stable  Diet recommendation: heart healthy/carb modified  Filed Weights   04/28/17 1445 04/28/17 2029  Weight: 75.8 kg (167 lb) 70.5 kg (155 lb 6.8 oz)    History of present illness:  PCP: Hamrick, Durward FortesMaura L, MD  Patient coming from: Home  I have personally briefly reviewed patient's [facility]  Chief Complaint: Near syncope  HPI: Alexandra Richardson is a 67 y.o. female with medical history significant of HLD, HTN, and anxietypresents to the emergency permit for evaluation of reported syncope. The patient states she has had 2 episodes in the last 2 days. She describes feeling lightheaded when standing.No chest pain, palpitations, dyspnea. Today she got up to answer the door for the delivery man and when she arrived there she fell forward.She was caught by the delivery person before hitting the ground but states she has had other falls resulting in head trauma recently. She states her doctor stopped her longtime Xanax prescription (~2 weeks ago last dose) without taper.  She reports severely decreased PO intake during this time period.   ED Course: Work up suspicious for dehydration with sodium 124, cl 81, BUN 29 creat 1.18, HGB 18.    Hospital Course:  Principal  Problem:   Near syncope Active Problems:   Depression   Anxiety   Dehydration with hyponatremia   Syncope:  -likely due to orthostatic hypotension. Dehydration. -she denies chest pain, no sob, no hypoxia -Ct head negative for acute findings, ekg no acute findings, tele unremarkable -uds negative -tele unremarkable, echo lvef wnl, grade 1 diastolic dysfunction -Review chart she has very low random cortisol level in the past, pmd to get cosyntropin stim test at hospital discharge follow up. -symptom resolved. She wants to go home, she declined snf.   Hyponatremia Sodium 124 on presentation, sodium improved to 130 today, d/c ivf. Encourage oral intake. Amitriptyline, Lexapro listed on her as her home meds, this could contribute to hyponatremia as well. She reports she has been on these meds for years and she think these meds are helping her. There are also several other psych meds on her home meds list, she is not sure if she is on it or not. All discontinued at discharge. Home meds Lasix held in the hospital, changed from 40mg  bid (home dose) to lasix 40mg  daily prn for edema at discharge.  Hypokalemia/hypomagnesemia:  Replaced. k normalized. She received iv mag in the hospital ,she is discharged on oral mag supplement.  Leukocytosis likely from dehydration no sign of infection UA no bacteria, chest x-ray no acute findings normalized  AKI on CKDII Bun 29/cr 1.18 on presentation, ua unremarkable Bun 9/cr 0.7 at discharge after rehydration  noninsulin dependent dm2,  a1c 5.6 Continue home meds metformin.  HTN; home bp meds  Ramipril held in the hospital, resumed at discharge.  Right knee pain,  right knee x ray with osteoarthritis uric acid wnl She reports has a walker at home She is advised to follow up with orthopedic surgery  Frontal headache with sinus congestion:  Iv toradol x1 Ct head negative Discharged on augmentin/flonase/mucinex, follow up with pcp in  one week.  Falls at home , PT eval, fall precaution  Anxiety: will not refill xaxax since now she is off more than 2weeks, she is prescribed with hydroxyzine prn for anxiety.  Code Status: full  Family Communication: patient   Disposition Plan: patient declined SNF placement, she is to discharge home with home health    Consultants:  none  Procedures:  none  Antibiotics:  none   Discharge Exam: BP (!) 152/82 (BP Location: Right Arm)   Pulse (!) 118   Temp 100.1 F (37.8 C) (Oral)   Resp 18   Ht 5\' 4"  (1.626 m)   Wt 70.5 kg (155 lb 6.8 oz)   SpO2 95%   BMI 26.68 kg/m   General: NAD, AAOx3,  Cardiovascular: RRR Respiratory: CTABL Musculoskeletal: No Edema Neuro: alert, oriented ( not oriented to months, report chronic), report poor memory    Discharge Instructions You were cared for by a hospitalist during your hospital stay. If you have any questions about your discharge medications or the care you received while you were in the hospital after you are discharged, you can call the unit and asked to speak with the hospitalist on call if the hospitalist that took care of you is not available. Once you are discharged, your primary care physician will handle any further medical issues. Please note that NO REFILLS for any discharge medications will be authorized once you are discharged, as it is imperative that you return to your primary care physician (or establish a relationship with a primary care physician if you do not have one) for your aftercare needs so that they can reassess your need for medications and monitor your lab values.  Discharge Instructions    Diet general   Complete by:  As directed    Increase activity slowly   Complete by:  As directed      Allergies as of 05/01/2017      Reactions   Bee Venom Other (See Comments)   Codeine Nausea And Vomiting      Medication List    STOP taking these medications   alprazolam 2 MG  tablet Commonly known as:  XANAX   celecoxib 200 MG capsule Commonly known as:  CELEBREX   risperiDONE 1 MG tablet Commonly known as:  RISPERDAL   risperiDONE 2 MG tablet Commonly known as:  RISPERDAL   venlafaxine XR 150 MG 24 hr capsule Commonly known as:  EFFEXOR-XR     TAKE these medications   albuterol (2.5 MG/3ML) 0.083% nebulizer solution Commonly known as:  PROVENTIL Take 3 mLs (2.5 mg total) by nebulization every 4 (four) hours as needed for wheezing or shortness of breath. Use neb or proventil but not both at the same time   albuterol 108 (90 Base) MCG/ACT inhaler Commonly known as:  PROVENTIL HFA;VENTOLIN HFA Inhale 2 puffs into the lungs every 6 (six) hours as needed for shortness of breath. For shortness of breath.   amitriptyline 150 MG tablet Commonly known as:  ELAVIL Take 1 tablet by mouth daily. Notes to patient:  Last Dose of this medication taken on 05/01/2017 at 9:07 am   amoxicillin-clavulanate 875-125 MG tablet Commonly known as:  AUGMENTIN Take  1 tablet by mouth 2 (two) times daily for 7 days. Notes to patient:  New Medication take dose on 05/01/2017 and then 2 times a day until medication is gone   atorvastatin 80 MG tablet Commonly known as:  LIPITOR Take 80 mg by mouth daily. Notes to patient:  Last Dose of this medication taken on 05/01/2017 at 9:07 am   escitalopram 20 MG tablet Commonly known as:  LEXAPRO Take 40 mg by mouth daily. Notes to patient:  Last dose of this medication was given on 05/01/2017 at 9:08 am   fluticasone 50 MCG/ACT nasal spray Commonly known as:  FLONASE Place 2 sprays into both nostrils daily. Start taking on:  05/02/2017 Notes to patient:  Last dose of this medication was given on 05/01/2017 at 9:10 am   furosemide 40 MG tablet Commonly known as:  LASIX Take 1 tablet (40 mg total) by mouth daily as needed for fluid or edema. What changed:    when to take this  reasons to take this   gabapentin 600 MG  tablet Commonly known as:  NEURONTIN Take 1 tablet (600 mg total) by mouth at bedtime. What changed:  when to take this Notes to patient:  Take this medication on 05/01/2017 at bedtime   guaiFENesin 600 MG 12 hr tablet Commonly known as:  MUCINEX Take 1 tablet (600 mg total) by mouth 2 (two) times daily. Notes to patient:  Last dose of this medication taken on 05/01/2017 9:07 am   hydrOXYzine 10 MG tablet Commonly known as:  ATARAX/VISTARIL Take 1 tablet (10 mg total) by mouth 3 (three) times daily as needed for anxiety.   magnesium oxide 400 MG tablet Commonly known as:  MAG-OX Take 400 mg by mouth 3 (three) times daily.   metFORMIN 1000 MG tablet Commonly known as:  GLUCOPHAGE Take 1,000 mg by mouth 2 (two) times daily with a meal.   omeprazole 20 MG capsule Commonly known as:  PRILOSEC Take 20 mg by mouth daily.   ramipril 2.5 MG capsule Commonly known as:  ALTACE Take 2.5 mg by mouth daily.   tiotropium 18 MCG inhalation capsule Commonly known as:  SPIRIVA Place 1 capsule (18 mcg total) into inhaler and inhale daily.   traMADol 50 MG tablet Commonly known as:  ULTRAM Take 50 mg by mouth every 6 (six) hours as needed for moderate pain.      Allergies  Allergen Reactions  . Bee Venom Other (See Comments)  . Codeine Nausea And Vomiting   Follow-up Information    [facility].   Specialty:  Home Health Services Why:  [facility] physical/occupational therapy,aide,social worker Contact information: [address] [place] [address] [phone]        Michaele Offer, DO Follow up in 1 week(s).   Specialty:  Family Medicine Why:  hospital discharge follow up, repeat cbc/bmp at follow up pcp to refer to psychology/psychiatry for anxiety. pcp to refer to orthopedic surgeon for right knee pain. Contact information: [address] [place] [address] [phone]            The results of significant  diagnostics from this hospitalization (including imaging, microbiology, ancillary and laboratory) are listed below for reference.    Significant Diagnostic Studies: Dg Chest 2 View  Result Date: 04/28/2017 CLINICAL DATA:  Weakness for 2-3 days. EXAM: CHEST - 2 VIEW COMPARISON:  PA and lateral chest 02/22/2013 and 04/19/2010. FINDINGS: The lungs are clear. Heart size is  normal. No pneumothorax or pleural effusion. No acute bony abnormality. IMPRESSION: No acute disease. Electronically Signed   By: Drusilla Kanner M.D.   On: 04/28/2017 15:47   Dg Knee 1-2 Views Right  Result Date: 04/30/2017 CLINICAL DATA:  Chronic right knee pain. Pain is worse with ambulation. EXAM: RIGHT KNEE - 1-2 VIEW COMPARISON:  Right knee radiographs 10/20/2010. FINDINGS: There is significant progression of tricompartmental degenerative changes in the right knee. Degenerative changes are most severe in the lateral and patellofemoral compartments. A small joint effusion is present. No acute fracture is present. Loose bodies are noted posteriorly within the joint, increased from the prior exam. IMPRESSION: 1. Progressive tricompartmental degenerative changes of the right knee. 2. Increased number of loose bodies within the joint. 3. Small joint effusion. 4. No other acute abnormality. Electronically Signed   By: Marin Roberts M.D.   On: 04/30/2017 13:50   Ct Head Wo Contrast  Result Date: 04/28/2017 CLINICAL DATA:  67 year old female with syncope versus presyncope at home this morning. EXAM: CT HEAD WITHOUT CONTRAST TECHNIQUE: Contiguous axial images were obtained from the base of the skull through the vertex without intravenous contrast. COMPARISON:  Brain MRI 11/17/2009. Head CT without contrast 11/15/2009. FINDINGS: Brain: Cerebral volume remains normal for age. Cavum septum pellucidum (normal variant). No midline shift, ventriculomegaly, mass effect, evidence of mass lesion, intracranial hemorrhage or evidence of  cortically based acute infarction. Scattered mild for age cerebral white matter hypodensity appears increased since 2011. No cortical encephalomalacia identified. Vascular: Calcified atherosclerosis at the skull base. No suspicious intracranial vascular hyperdensity. Skull: Stable and negative. Sinuses/Orbits: Clear. Other: Visualized orbit soft tissues are within normal limits. Visualized scalp soft tissues are within normal limits. IMPRESSION: 1.  No acute intracranial abnormality. 2. Mild for age nonspecific white matter changes appear progressed since 2011, most commonly due to small vessel disease. Electronically Signed   By: Odessa Fleming M.D.   On: 04/28/2017 15:59    Microbiology: No results found for this or any previous visit (from the past 240 hour(s)).   Labs: Basic Metabolic Panel: Recent Labs  Lab 04/28/17 1501 04/29/17 0505 04/29/17 0507 04/30/17 0505 05/01/17 0600  NA 124*  --  128* 130* 129*  K 3.6  --  3.0* 4.1 4.1  CL 81*  --  89* 95* 94*  CO2 29  --  28 27 27   GLUCOSE 124*  --  144* 114* 96  BUN 29*  --  23* 15 9  CREATININE 1.18*  --  0.96 0.81 0.73  CALCIUM 10.3  --  9.4 8.8* 9.0  MG  --  1.8  --  1.2* 1.4*   Liver Function Tests: Recent Labs  Lab 04/28/17 1601  AST 27  ALT 26  ALKPHOS 25*  BILITOT 1.0  PROT 7.5  ALBUMIN 4.1   Recent Labs  Lab 04/28/17 1601  LIPASE 40   No results for input(s): AMMONIA in the last 168 hours. CBC: Recent Labs  Lab 04/28/17 1501 04/30/17 0505  WBC 10.6* 8.5  NEUTROABS  --  4.1  HGB 18.3* 14.4  HCT 49.4* 43.2  MCV 84.3 89.1  PLT 254 218   Cardiac Enzymes: No results for input(s): CKTOTAL, CKMB, CKMBINDEX, TROPONINI in the last 168 hours. BNP: BNP (last 3 results) No results for input(s): BNP in the last 8760 hours.  ProBNP (last 3 results) No results for input(s): PROBNP in the last 8760 hours.  CBG: Recent Labs  Lab 04/28/17 1504  GLUCAP 131*  Signed:  Albertine Grates MD, PhD  [facility] 05/01/2017, 3:00 PM

## 2017-05-22 ENCOUNTER — Other Ambulatory Visit: Payer: Self-pay

## 2017-05-22 ENCOUNTER — Emergency Department (HOSPITAL_COMMUNITY)
Admission: EM | Admit: 2017-05-22 | Discharge: 2017-05-23 | Disposition: A | Discharge status: Home / Self Care | Payer: Medicare Other | Source: Home / Self Care | Attending: Emergency Medicine | Admitting: Emergency Medicine

## 2017-05-22 ENCOUNTER — Emergency Department (HOSPITAL_COMMUNITY): Payer: Medicare Other

## 2017-05-22 DIAGNOSIS — E871 Hypo-osmolality and hyponatremia: Secondary | ICD-10-CM | POA: Diagnosis not present

## 2017-05-22 DIAGNOSIS — R4781 Slurred speech: Secondary | ICD-10-CM | POA: Insufficient documentation

## 2017-05-22 DIAGNOSIS — E860 Dehydration: Secondary | ICD-10-CM

## 2017-05-22 DIAGNOSIS — R42 Dizziness and giddiness: Secondary | ICD-10-CM | POA: Insufficient documentation

## 2017-05-22 DIAGNOSIS — Y929 Unspecified place or not applicable: Secondary | ICD-10-CM

## 2017-05-22 DIAGNOSIS — Y939 Activity, unspecified: Secondary | ICD-10-CM

## 2017-05-22 DIAGNOSIS — Y92009 Unspecified place in unspecified non-institutional (private) residence as the place of occurrence of the external cause: Principal | ICD-10-CM

## 2017-05-22 DIAGNOSIS — I10 Essential (primary) hypertension: Secondary | ICD-10-CM

## 2017-05-22 DIAGNOSIS — M25561 Pain in right knee: Secondary | ICD-10-CM

## 2017-05-22 DIAGNOSIS — Z87891 Personal history of nicotine dependence: Secondary | ICD-10-CM

## 2017-05-22 DIAGNOSIS — M25562 Pain in left knee: Secondary | ICD-10-CM

## 2017-05-22 DIAGNOSIS — M25552 Pain in left hip: Secondary | ICD-10-CM | POA: Insufficient documentation

## 2017-05-22 DIAGNOSIS — R296 Repeated falls: Secondary | ICD-10-CM

## 2017-05-22 DIAGNOSIS — W19XXXA Unspecified fall, initial encounter: Secondary | ICD-10-CM | POA: Insufficient documentation

## 2017-05-22 DIAGNOSIS — Y999 Unspecified external cause status: Secondary | ICD-10-CM | POA: Insufficient documentation

## 2017-05-22 LAB — CBC WITH DIFFERENTIAL/PLATELET
Basophils Absolute: 0 10*3/uL (ref 0.0–0.1)
Basophils Relative: 0 %
Eosinophils Absolute: 0.1 10*3/uL (ref 0.0–0.7)
Eosinophils Relative: 1 %
HCT: 38.2 % (ref 36.0–46.0)
Hemoglobin: 13.6 g/dL (ref 12.0–15.0)
Lymphocytes Relative: 27 %
Lymphs Abs: 3.8 10*3/uL (ref 0.7–4.0)
MCH: 29.8 pg (ref 26.0–34.0)
MCHC: 35.6 g/dL (ref 30.0–36.0)
MCV: 83.6 fL (ref 78.0–100.0)
Monocytes Absolute: 0.7 10*3/uL (ref 0.1–1.0)
Monocytes Relative: 5 %
Neutro Abs: 9.4 10*3/uL — ABNORMAL HIGH (ref 1.7–7.7)
Neutrophils Relative %: 67 %
Platelets: 192 10*3/uL (ref 150–400)
RBC: 4.57 MIL/uL (ref 3.87–5.11)
RDW: 14.1 % (ref 11.5–15.5)
WBC: 14 10*3/uL — ABNORMAL HIGH (ref 4.0–10.5)

## 2017-05-22 LAB — COMPREHENSIVE METABOLIC PANEL
ALT: 29 U/L (ref 14–54)
AST: 23 U/L (ref 15–41)
Albumin: 3.6 g/dL (ref 3.5–5.0)
Alkaline Phosphatase: 36 U/L — ABNORMAL LOW (ref 38–126)
Anion gap: 16 — ABNORMAL HIGH (ref 5–15)
BUN: 7 mg/dL (ref 6–20)
CO2: 25 mmol/L (ref 22–32)
Calcium: 8.3 mg/dL — ABNORMAL LOW (ref 8.9–10.3)
Chloride: 84 mmol/L — ABNORMAL LOW (ref 101–111)
Creatinine, Ser: 0.84 mg/dL (ref 0.44–1.00)
GFR calc Af Amer: >60 mL/min (ref >60)
GFR calc non Af Amer: >60 mL/min (ref >60)
Glucose, Bld: 115 mg/dL — ABNORMAL HIGH (ref 65–99)
Potassium: 3.7 mmol/L (ref 3.5–5.1)
Sodium: 125 mmol/L — ABNORMAL LOW (ref 135–145)
Total Bilirubin: 0.7 mg/dL (ref 0.3–1.2)
Total Protein: 6.6 g/dL (ref 6.5–8.1)

## 2017-05-22 LAB — URINALYSIS, ROUTINE W REFLEX MICROSCOPIC
Bilirubin Urine: NEGATIVE
Glucose, UA: NEGATIVE mg/dL
Hgb urine dipstick: NEGATIVE
Ketones, ur: NEGATIVE mg/dL
Leukocytes, UA: NEGATIVE
Nitrite: NEGATIVE
Protein, ur: NEGATIVE mg/dL
Specific Gravity, Urine: 1.003 — ABNORMAL LOW (ref 1.005–1.030)
pH: 6 (ref 5.0–8.0)

## 2017-05-22 LAB — CBG MONITORING, ED: Glucose-Capillary: 126 mg/dL — ABNORMAL HIGH (ref 65–99)

## 2017-05-22 LAB — ACETAMINOPHEN LEVEL: Acetaminophen (Tylenol), Serum: <10 ug/mL — ABNORMAL LOW (ref 10–30)

## 2017-05-22 LAB — SALICYLATE LEVEL: Salicylate Lvl: <7 mg/dL (ref 2.8–30.0)

## 2017-05-22 LAB — ETHANOL: Alcohol, Ethyl (B): <10 mg/dL (ref <10)

## 2017-05-22 MED ORDER — SODIUM CHLORIDE 0.9 % IV BOLUS
1000.0000 mL | Freq: Once | INTRAVENOUS | Status: AC
  Administered 2017-05-22: 1000 mL via INTRAVENOUS

## 2017-05-22 NOTE — ED Notes (Signed)
Pt arrived via [facility].  States she has fallen 2x today and doesn't remember what caused the falls. EMS reports unknown pills found scattered on the floor and slurred speech with confusion to time. She reports left hip and bilateral knee pain.  Abrasion to left knee and left cheek with multiple scattered bruises.

## 2017-05-22 NOTE — ED Provider Notes (Signed)
[facility] EMERGENCY DEPARTMENT Provider Note   CSN: [id number] Arrival date & time: 05/22/17  2054    History   Chief Complaint Chief Complaint  Patient presents with  . Fall    HPI Alexandra Richardson is a 67 y.o. female.   67 year old female with history of anxiety, depression, dyslipidemia, hypertension presents to the emergency department after a fall at home.  She has had multiple falls recently and reports lightheadedness with position change.  She was admitted for loss of consciousness/syncope at the end of March with reassuring echocardiogram and prolonged observation.  EMS reports unknown pills found scattered in the home on the floor with slurred speech and confusion during initial patient evaluation.  Patient complaining of left hip and bilateral knee pain secondary to the fall.  Denies any chest pain or shortness of breath prior to syncope or near syncope.  Denies any alcohol or illicit drug use.     Past Medical History:  Diagnosis Date  . Anxiety   . Anxiety   . Arthritis   . Depression   . Grief 07/03/2011  . Hyperlipidemia   . Hypertension     Patient Active Problem List   Diagnosis Date Noted  . Near syncope 04/28/2017  . Dehydration with hyponatremia 04/28/2017  . Pneumonia 02/07/2013  . Hypokalemia 07/03/2011  . Grief 07/03/2011  . Encephalopathy 07/02/2011  . Hyperlipidemia 07/02/2011  . Anxiety 07/02/2011  . Overdose 07/01/2011  . Altered mental status 07/01/2011  . Hypertension 07/01/2011  . Depression 07/01/2011  . UTI (lower urinary tract infection) 07/01/2011    Past Surgical History:  Procedure Laterality Date  . CHOLECYSTECTOMY       OB History   None      Home Medications    Prior to Admission medications   Medication Sig Start Date End Date Taking? Authorizing Provider  albuterol (PROVENTIL HFA;VENTOLIN HFA) 108 (90 BASE) MCG/ACT inhaler Inhale 2 puffs into the lungs every 6 (six) hours as needed for  shortness of breath. For shortness of breath. 02/12/13   Jeanella Craze, NP  albuterol (PROVENTIL) (2.5 MG/3ML) 0.083% nebulizer solution Take 3 mLs (2.5 mg total) by nebulization every 4 (four) hours as needed for wheezing or shortness of breath. Use neb or proventil but not both at the same time 02/12/13   Jeanella Craze, NP  amitriptyline (ELAVIL) 150 MG tablet Take 1 tablet by mouth daily. 01/15/13   [provider]  atorvastatin (LIPITOR) 80 MG tablet Take 80 mg by mouth daily.     [provider]  escitalopram (LEXAPRO) 20 MG tablet Take 40 mg by mouth daily.    [provider]  fluticasone (FLONASE) 50 MCG/ACT nasal spray Place 2 sprays into both nostrils daily. 05/02/17   Albertine Grates, MD  furosemide (LASIX) 40 MG tablet Take 1 tablet (40 mg total) by mouth daily as needed for fluid or edema. 05/01/17   Albertine Grates, MD  gabapentin (NEURONTIN) 600 MG tablet Take 1 tablet (600 mg total) by mouth at bedtime. 05/01/17   Albertine Grates, MD  guaiFENesin (MUCINEX) 600 MG 12 hr tablet Take 1 tablet (600 mg total) by mouth 2 (two) times daily. 05/01/17   Albertine Grates, MD  hydrOXYzine (ATARAX/VISTARIL) 10 MG tablet Take 1 tablet (10 mg total) by mouth 3 (three) times daily as needed for anxiety. 05/01/17   Albertine Grates, MD  magnesium oxide (MAG-OX) 400 MG tablet Take 400 mg by mouth 3 (three) times daily.  [provider]  metFORMIN (GLUCOPHAGE) 1000 MG tablet Take 1,000 mg by mouth 2 (two) times daily with a meal.    [provider]  omeprazole (PRILOSEC) 20 MG capsule Take 20 mg by mouth daily.    [provider]  ramipril (ALTACE) 2.5 MG capsule Take 2.5 mg by mouth daily.    [provider]  tiotropium (SPIRIVA) 18 MCG inhalation capsule Place 1 capsule (18 mcg total) into inhaler and inhale daily. 02/12/13   Jeanella Craze, NP  traMADol (ULTRAM) 50 MG tablet Take 50 mg by mouth every 6 (six) hours as needed for moderate pain.    [provider]     Family History Family History  Problem Relation Age of Onset  . Coronary artery disease Father   . Diabetes type II Sister   . Hypertension Mother     Social History Social History   Tobacco Use  . Smoking status: Former Smoker    Packs/day: 1.00    Years: 40.00    Pack years: 40.00    Types: Cigarettes  . Smokeless tobacco: Never Used  Substance Use Topics  . Alcohol use: Yes    Comment: occasionally  . Drug use: No     Allergies   Bee venom and Codeine   Review of Systems Review of Systems Ten systems reviewed and are negative for acute change, except as noted in the HPI.    Physical Exam Updated Vital Signs BP (!) 120/58 (BP Location: Right Arm)   Pulse 88   Temp 98.3 F (36.8 C) (Oral)   Resp 17   Ht 5\' 3"  (1.6 m)   Wt 70.3 kg (155 lb)   SpO2 93%   BMI 27.46 kg/m   Physical Exam  Constitutional: She is oriented to person, place, and time. She appears well-developed and well-nourished. No distress.  Pale appearing, nontoxic.  HENT:  Head: Normocephalic and atraumatic.  Eyes: Conjunctivae and EOM are normal. No scleral icterus.  Neck: Normal range of motion.  Normal ROM  Cardiovascular: Normal rate, regular rhythm and intact distal pulses.  Pulmonary/Chest: Effort normal. No stridor. No respiratory distress. She has no rales.  Respirations even and unlabored  Abdominal: Soft. She exhibits no distension. There is no tenderness. There is no guarding.  Soft, obese. No palpable masses or peritoneal signs.  Musculoskeletal: Normal range of motion.  Neurological: She is alert and oriented to person, place, and time. She exhibits normal muscle tone. Coordination normal.  Alert to person, city, president. No focal deficits appreciated. Moving all extremities spontaneously.  Skin: Skin is warm and dry. No rash noted. She is not diaphoretic. No erythema. No pallor.  Multiple skin abrasions and areas of ecchymosis. Various bruising in different stages of  healing.  Psychiatric: She has a normal mood and affect. Her behavior is normal.  Nursing note and vitals reviewed.    ED Treatments / Results  Labs (all labs ordered are listed, but only abnormal results are displayed) Labs Reviewed  URINALYSIS, ROUTINE W REFLEX MICROSCOPIC - Abnormal; Notable for the following components:      Result Value   Color, Urine STRAW (*)    Specific Gravity, Urine 1.003 (*)    All other components within normal limits  CBC WITH DIFFERENTIAL/PLATELET - Abnormal; Notable for the following components:   WBC 14.0 (*)    Neutro Abs 9.4 (*)    All other components within normal limits  COMPREHENSIVE METABOLIC PANEL - Abnormal; Notable for the following  components:   Sodium 125 (*)    Chloride 84 (*)    Glucose, Bld 115 (*)    Calcium 8.3 (*)    Alkaline Phosphatase 36 (*)    Anion gap 16 (*)    All other components within normal limits  ACETAMINOPHEN LEVEL - Abnormal; Notable for the following components:   Acetaminophen (Tylenol), Serum <10 (*)    All other components within normal limits  D-DIMER, QUANTITATIVE (NOT AT [facility]) - Abnormal; Notable for the following components:   D-Dimer, Quant 14.39 (*)    All other components within normal limits  CBG MONITORING, ED - Abnormal; Notable for the following components:   Glucose-Capillary 126 (*)    All other components within normal limits  I-STAT ARTERIAL BLOOD GAS, ED - Abnormal; Notable for the following components:   pO2, Arterial 70.0 (*)    Acid-Base Excess 3.0 (*)    All other components within normal limits  ETHANOL  SALICYLATE LEVEL  RAPID URINE DRUG SCREEN, HOSP PERFORMED  AMMONIA    EKG EKG Interpretation  Date/Time:  Monday May 22 2017 23:04:35 EDT Ventricular Rate:  97 PR Interval:  160 QRS Duration: 82 QT Interval:  368 QTC Calculation: 467 R Axis:   -12 Text Interpretation:  Normal sinus rhythm Low voltage QRS Cannot rule out Anterior infarct , age undetermined Confirmed by  Palumbo, April ([id number]) on 05/22/2017 11:08:26 PM   Radiology Dg Chest 2 View  Result Date: 05/22/2017 CLINICAL DATA:  67 y/o  F; 2 falls today. EXAM: CHEST - 2 VIEW COMPARISON:  04/28/2017 chest radiograph FINDINGS: Stable cardiac silhouette given projection and technique. Aortic atherosclerosis with calcification. Clear lungs. No pleural effusion or pneumothorax. No acute osseous abnormality is evident. IMPRESSION: No acute pulmonary process identified.  Aortic atherosclerosis. Electronically Signed   By: Mitzi Hansen M.D.   On: 05/22/2017 22:47   Dg Knee 2 Views Left  Result Date: 05/22/2017 CLINICAL DATA:  Pt States she has fallen 2x today at home and doesn't remember what caused the falls. She reports left hip and bilateral knee pain. Abrasion to left anterior knee. Left hurts worse than right. Both hurt "all over" Chronic coug.*comment was truncated* EXAM: LEFT KNEE - 1-2 VIEW COMPARISON:  None. FINDINGS: There is narrowing of the medial compartment with osteophytosis. Spurring of the patella. No joint effusion. No fracture. IMPRESSION: Osteoarthritis of the medial compartment.  No acute findings. Electronically Signed   By: Genevive Bi M.D.   On: 05/22/2017 22:38   Dg Knee 2 Views Right  Result Date: 05/22/2017 CLINICAL DATA:  Pt States she has fallen 2x today at home and doesn't remember what caused the falls. She reports left hip and bilateral knee pain. Abrasion to left knee. Left hurts worse than right. Chronic cough - current smoker of 1 pk per.*comment was truncated* EXAM: RIGHT KNEE - 1-2 VIEW COMPARISON:  None. FINDINGS: Narrowing of the lateral compartment osteophytosis. Osteophytosis of the medial compartment. Spurring of the patella. No joint effusion. 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 No acute findings. Electronically Signed   By: Genevive Bi M.D.   On: 05/22/2017 22:40   Ct Head Wo Contrast  Result Date:  05/22/2017 CLINICAL DATA:  Status post 2 falls today, with left cheek abrasion. Concern for head or cervical spine injury. Initial encounter. EXAM: CT HEAD WITHOUT CONTRAST CT MAXILLOFACIAL WITHOUT CONTRAST CT CERVICAL SPINE WITHOUT CONTRAST TECHNIQUE: Multidetector CT imaging of the head, cervical spine, and maxillofacial structures were performed  using the standard protocol without intravenous contrast. Multiplanar CT image reconstructions of the cervical spine and maxillofacial structures were also generated. COMPARISON:  CT of the head performed 04/28/2017, and CT of the head and cervical spine performed 11/15/2009. MRI of the brain and neck performed 11/17/2009 FINDINGS: CT HEAD FINDINGS Brain: No evidence of acute infarction, hemorrhage, hydrocephalus, extra-axial collection or mass lesion/mass effect. A cavum septum pellucidum is noted. The posterior fossa, including the cerebellum, brainstem and fourth ventricle, is within normal limits. The third and lateral ventricles, and basal ganglia are unremarkable in appearance. The cerebral hemispheres are symmetric in appearance, with normal gray-white differentiation. No mass effect or midline shift is seen. Vascular: No hyperdense vessel or unexpected calcification. Skull: There is no evidence of fracture; visualized osseous structures are unremarkable in appearance. Other: No significant soft tissue abnormalities are seen. CT MAXILLOFACIAL FINDINGS Osseous: There is no evidence of fracture or dislocation. The maxilla and mandible appear intact. The nasal bone is unremarkable in appearance. There is chronic absence of the dentition. Orbits: The orbits are intact bilaterally. Sinuses: Mucosal thickening is noted at the maxillary sinuses bilaterally. The remaining visualized paranasal sinuses and mastoid air cells are well-aerated. Soft tissues: No significant soft tissue abnormalities are seen. The parapharyngeal fat planes are preserved. The nasopharynx,  oropharynx and hypopharynx are unremarkable in appearance. The visualized portions of the valleculae and piriform sinuses are grossly unremarkable. The parotid and submandibular glands are within normal limits. No cervical lymphadenopathy is seen. CT CERVICAL SPINE FINDINGS Alignment: Normal. Skull base and vertebrae: No acute fracture. No primary bone lesion or focal pathologic process. Soft tissues and spinal canal: No prevertebral fluid or swelling. No visible canal hematoma. Disc levels: Intervertebral disc spaces are grossly preserved. Scattered anterior and posterior disc osteophyte complexes are noted along the cervical spine. Mild degenerative change is noted at the dens. Upper chest: There is asymmetric prominence of the right thyroid lobe, with a suspected 1.8 cm nodule. The visualized lung apices are clear. Other: No additional soft tissue abnormalities are seen. IMPRESSION: 1. No evidence of traumatic intracranial injury or fracture. 2. No evidence of fracture or subluxation along the cervical spine. 3. No evidence of fracture or dislocation with regard to the maxillofacial structures. 4. Mucosal thickening at the maxillary sinuses bilaterally. 5. Mild degenerative change along the cervical spine. 6. **An incidental finding of potential clinical significance has been found. Asymmetric prominence of the right thyroid lobe, with a suspected 1.8 cm nodule. Consider further evaluation with thyroid ultrasound. If patient is clinically hyperthyroid, consider nuclear medicine thyroid uptake and scan.** Electronically Signed   By: Roanna Raider M.D.   On: 05/22/2017 22:12   Ct Angio Chest Pe W And/or Wo Contrast  Result Date: 05/23/2017 CLINICAL DATA:  Slurred speech and confusion. Multiple falls today. Elevated D-dimer. EXAM: CT ANGIOGRAPHY CHEST WITH CONTRAST TECHNIQUE: Multidetector CT imaging of the chest was performed using the standard protocol during bolus administration of intravenous contrast.  Multiplanar CT image reconstructions and MIPs were obtained to evaluate the vascular anatomy. CONTRAST:  ISOVUE-370 IOPAMIDOL (ISOVUE-370) INJECTION 76% COMPARISON:  Chest radiograph April 21, 2017 FINDINGS: CARDIOVASCULAR: Adequate contrast opacification of the pulmonary artery's. Main pulmonary artery is not enlarged. No pulmonary arterial filling defects to the level of the subsegmental branches. Heart size is upper limits of normal, no right heart strain. Mild coronary artery calcifications. Trace pericardial effusion. Thoracic aorta is normal course and caliber, mild calcific atherosclerosis aortic arch. LEFT vertebral artery arises directly from the  aortic arch. MEDIASTINUM/NODES: No lymphadenopathy by CT size criteria. LUNGS/PLEURA: Tracheobronchial tree is patent, no pneumothorax. Moderate bronchial wall thickening. LEFT lower lobe atelectasis. No pleural effusions, focal consolidations, pulmonary nodules or masses. UPPER ABDOMEN: Nonacute. 12 mm benign LEFT adrenal adenoma (0 Hounsfield units). Status post cholecystectomy MUSCULOSKELETAL: Mildly accentuated thoracic kyphosis with multilevel bridging ventral osteophytes. Review of the MIP images confirms the above findings. IMPRESSION: 1. No acute pulmonary embolism. 2. Bronchial wall thickening seen with bronchitis or reactive airway disease. No pneumonia. Aortic Atherosclerosis (ICD10-I70.0). Electronically Signed   By: Awilda Metro M.D.   On: 05/23/2017 04:14   Ct Cervical Spine Wo Contrast  Result Date: 05/22/2017 CLINICAL DATA:  Status post 2 falls today, with left cheek abrasion. Concern for head or cervical spine injury. Initial encounter. EXAM: CT HEAD WITHOUT CONTRAST CT MAXILLOFACIAL WITHOUT CONTRAST CT CERVICAL SPINE WITHOUT CONTRAST TECHNIQUE: Multidetector CT imaging of the head, cervical spine, and maxillofacial structures were performed using the standard protocol without intravenous contrast. Multiplanar CT image  reconstructions of the cervical spine and maxillofacial structures were also generated. COMPARISON:  CT of the head performed 04/28/2017, and CT of the head and cervical spine performed 11/15/2009. MRI of the brain and neck performed 11/17/2009 FINDINGS: CT HEAD FINDINGS Brain: No evidence of acute infarction, hemorrhage, hydrocephalus, extra-axial collection or mass lesion/mass effect. A cavum septum pellucidum is noted. The posterior fossa, including the cerebellum, brainstem and fourth ventricle, is within normal limits. The third and lateral ventricles, and basal ganglia are unremarkable in appearance. The cerebral hemispheres are symmetric in appearance, with normal gray-white differentiation. No mass effect or midline shift is seen. Vascular: No hyperdense vessel or unexpected calcification. Skull: There is no evidence of fracture; visualized osseous structures are unremarkable in appearance. Other: No significant soft tissue abnormalities are seen. CT MAXILLOFACIAL FINDINGS Osseous: There is no evidence of fracture or dislocation. The maxilla and mandible appear intact. The nasal bone is unremarkable in appearance. There is chronic absence of the dentition. Orbits: The orbits are intact bilaterally. Sinuses: Mucosal thickening is noted at the maxillary sinuses bilaterally. The remaining visualized paranasal sinuses and mastoid air cells are well-aerated. Soft tissues: No significant soft tissue abnormalities are seen. The parapharyngeal fat planes are preserved. The nasopharynx, oropharynx and hypopharynx are unremarkable in appearance. The visualized portions of the valleculae and piriform sinuses are grossly unremarkable. The parotid and submandibular glands are within normal limits. No cervical lymphadenopathy is seen. CT CERVICAL SPINE FINDINGS Alignment: Normal. Skull base and vertebrae: No acute fracture. No primary bone lesion or focal pathologic process. Soft tissues and spinal canal: No prevertebral  fluid or swelling. No visible canal hematoma. Disc levels: Intervertebral disc spaces are grossly preserved. Scattered anterior and posterior disc osteophyte complexes are noted along the cervical spine. Mild degenerative change is noted at the dens. Upper chest: There is asymmetric prominence of the right thyroid lobe, with a suspected 1.8 cm nodule. The visualized lung apices are clear. Other: No additional soft tissue abnormalities are seen. IMPRESSION: 1. No evidence of traumatic intracranial injury or fracture. 2. No evidence of fracture or subluxation along the cervical spine. 3. No evidence of fracture or dislocation with regard to the maxillofacial structures. 4. Mucosal thickening at the maxillary sinuses bilaterally. 5. Mild degenerative change along the cervical spine. 6. **An incidental finding of potential clinical significance has been found. Asymmetric prominence of the right thyroid lobe, with a suspected 1.8 cm nodule. Consider further evaluation with thyroid ultrasound. If patient is clinically hyperthyroid, consider  nuclear medicine thyroid uptake and scan.** Electronically Signed   By: Roanna Raider M.D.   On: 05/22/2017 22:12   Dg Hip Unilat W Or Wo Pelvis 2-3 Views Left  Result Date: 05/22/2017 CLINICAL DATA:  Pt States she has fallen 2x today at home and doesn't remember what caused the falls. She reports left hip and bilateral knee pain. Abrasion to left knee. Left hurts worse than right. Chronic cough - current smoker of 1 pk per.*comment was truncated* EXAM: DG HIP (WITH OR WITHOUT PELVIS) 2-3V LEFT COMPARISON:  None. FINDINGS: Hips are located. No evidence of pelvic fracture or sacral fracture. Dedicated view of the LEFT hip demonstrates no femoral neck fracture. IMPRESSION: No pelvic fracture or hip fracture. Electronically Signed   By: Genevive Bi M.D.   On: 05/22/2017 22:42   Ct Maxillofacial Wo Contrast  Result Date: 05/22/2017 CLINICAL DATA:  Status post 2 falls today,  with left cheek abrasion. Concern for head or cervical spine injury. Initial encounter. EXAM: CT HEAD WITHOUT CONTRAST CT MAXILLOFACIAL WITHOUT CONTRAST CT CERVICAL SPINE WITHOUT CONTRAST TECHNIQUE: Multidetector CT imaging of the head, cervical spine, and maxillofacial structures were performed using the standard protocol without intravenous contrast. Multiplanar CT image reconstructions of the cervical spine and maxillofacial structures were also generated. COMPARISON:  CT of the head performed 04/28/2017, and CT of the head and cervical spine performed 11/15/2009. MRI of the brain and neck performed 11/17/2009 FINDINGS: CT HEAD FINDINGS Brain: No evidence of acute infarction, hemorrhage, hydrocephalus, extra-axial collection or mass lesion/mass effect. A cavum septum pellucidum is noted. The posterior fossa, including the cerebellum, brainstem and fourth ventricle, is within normal limits. The third and lateral ventricles, and basal ganglia are unremarkable in appearance. The cerebral hemispheres are symmetric in appearance, with normal gray-white differentiation. No mass effect or midline shift is seen. Vascular: No hyperdense vessel or unexpected calcification. Skull: There is no evidence of fracture; visualized osseous structures are unremarkable in appearance. Other: No significant soft tissue abnormalities are seen. CT MAXILLOFACIAL FINDINGS Osseous: There is no evidence of fracture or dislocation. The maxilla and mandible appear intact. The nasal bone is unremarkable in appearance. There is chronic absence of the dentition. Orbits: The orbits are intact bilaterally. Sinuses: Mucosal thickening is noted at the maxillary sinuses bilaterally. The remaining visualized paranasal sinuses and mastoid air cells are well-aerated. Soft tissues: No significant soft tissue abnormalities are seen. The parapharyngeal fat planes are preserved. The nasopharynx, oropharynx and hypopharynx are unremarkable in appearance. The  visualized portions of the valleculae and piriform sinuses are grossly unremarkable. The parotid and submandibular glands are within normal limits. No cervical lymphadenopathy is seen. CT CERVICAL SPINE FINDINGS Alignment: Normal. Skull base and vertebrae: No acute fracture. No primary bone lesion or focal pathologic process. Soft tissues and spinal canal: No prevertebral fluid or swelling. No visible canal hematoma. Disc levels: Intervertebral disc spaces are grossly preserved. Scattered anterior and posterior disc osteophyte complexes are noted along the cervical spine. Mild degenerative change is noted at the dens. Upper chest: There is asymmetric prominence of the right thyroid lobe, with a suspected 1.8 cm nodule. The visualized lung apices are clear. Other: No additional soft tissue abnormalities are seen. IMPRESSION: 1. No evidence of traumatic intracranial injury or fracture. 2. No evidence of fracture or subluxation along the cervical spine. 3. No evidence of fracture or dislocation with regard to the maxillofacial structures. 4. Mucosal thickening at the maxillary sinuses bilaterally. 5. Mild degenerative change along the cervical spine. 6. **  An incidental finding of potential clinical significance has been found. Asymmetric prominence of the right thyroid lobe, with a suspected 1.8 cm nodule. Consider further evaluation with thyroid ultrasound. If patient is clinically hyperthyroid, consider nuclear medicine thyroid uptake and scan.** Electronically Signed   By: Roanna RaiderJeffery  Chang M.D.   On: 05/22/2017 22:12    Procedures Procedures (including critical care time)  Medications Ordered in ED Medications  sodium chloride 0.9 % bolus 1,000 mL (0 mLs Intravenous Stopped 05/23/17 0111)  sodium chloride 0.9 % bolus 1,000 mL (0 mLs Intravenous Stopped 05/23/17 0205)  iopamidol (ISOVUE-370) 76 % injection 100 mL (100 mLs Intravenous Contrast Given 05/23/17 0346)     Initial Impression / Assessment and Plan  / ED Course  I have reviewed the triage vital signs and the nursing notes.  Pertinent labs & imaging results that were available during my care of the patient were reviewed by me and considered in my medical decision making (see chart for details).     67 year old female presents to the emergency department for evaluation of frequent falls.  She was admitted for syncope evaluation at the end of March with reassuring echocardiogram and prolonged observation.  Patient reports experiencing issues with falls for "months".  She attributes this to being taken off of her Xanax, though she has been off of this for at least 2 months.  No present concern for benzodiazepine withdrawal.  Patient has been up and alert since arrival.  She is moving all extremities spontaneously.  No history of fever and patient noted to be afebrile on arrival.  She has had stable vital signs.  Laboratory workup has been extensive and fairly reassuring.  She has a leukocytosis today which is nonspecific and may be secondary to recent trauma as EMS reports 2 falls prior to transport.  She has chronic hyponatremia and hypochloremia for which she was hydrated in the emergency department.  This was persistent on prior admission and also present on hospital discharge.  Patient denies any complaints of chest pain or loss of breath prior to her fall.  She has no STEMI; EKG with NSR.  A d-dimer was ordered in light of history and recurrent symptoms.  This was elevated; however, CT angiogram does not correlate with any evidence of pulmonary embolus.  Extensive imaging was also ordered on patient arrival.  These have been reviewed and show no evidence of acute traumatic injury.  Patient has been reassessed numerous times while in the emergency department.  I have discussed her reassuring evaluation and she expresses desire for discharge.  She states that she has a cane and walker at home, but she neglects to use them.  I have advised that she  use them at all times when attempting ambulation.  Will also order face-to-face consultation for care management as patient would benefit from home health assessment.  The patient has been encouraged to follow-up with her primary care doctor this week for repeat evaluation.  Return precautions discussed and provided. Patient discharged in stable condition with no unaddressed concerns.   Final Clinical Impressions(s) / ED Diagnoses   Final diagnoses:  Fall in home, initial encounter  Dehydration    ED Discharge Orders    None       Antony MaduraHumes, Cedarius Kersh, Cordelia Poche-C 05/23/17 Lear [id number]    Palumbo, April, MD 05/23/17 [phone]

## 2017-05-23 ENCOUNTER — Emergency Department (HOSPITAL_COMMUNITY): Payer: Medicare Other

## 2017-05-23 ENCOUNTER — Encounter (HOSPITAL_COMMUNITY): Payer: Self-pay | Admitting: Emergency Medicine

## 2017-05-23 ENCOUNTER — Emergency Department (HOSPITAL_COMMUNITY)
Admission: EM | Admit: 2017-05-23 | Discharge: 2017-05-23 | Disposition: A | Discharge status: Home / Self Care | Payer: Medicare Other | Source: Home / Self Care | Attending: Emergency Medicine | Admitting: Emergency Medicine

## 2017-05-23 DIAGNOSIS — Z79899 Other long term (current) drug therapy: Secondary | ICD-10-CM

## 2017-05-23 DIAGNOSIS — M25552 Pain in left hip: Secondary | ICD-10-CM

## 2017-05-23 DIAGNOSIS — Z7984 Long term (current) use of oral hypoglycemic drugs: Secondary | ICD-10-CM | POA: Insufficient documentation

## 2017-05-23 DIAGNOSIS — Y92481 Parking lot as the place of occurrence of the external cause: Secondary | ICD-10-CM | POA: Insufficient documentation

## 2017-05-23 DIAGNOSIS — Z87891 Personal history of nicotine dependence: Secondary | ICD-10-CM

## 2017-05-23 DIAGNOSIS — Y92238 Other place in hospital as the place of occurrence of the external cause: Secondary | ICD-10-CM | POA: Insufficient documentation

## 2017-05-23 DIAGNOSIS — W19XXXA Unspecified fall, initial encounter: Secondary | ICD-10-CM

## 2017-05-23 DIAGNOSIS — I10 Essential (primary) hypertension: Secondary | ICD-10-CM | POA: Insufficient documentation

## 2017-05-23 DIAGNOSIS — W010XXA Fall on same level from slipping, tripping and stumbling without subsequent striking against object, initial encounter: Secondary | ICD-10-CM

## 2017-05-23 LAB — CBC WITH DIFFERENTIAL/PLATELET
Basophils Absolute: 0 10*3/uL (ref 0.0–0.1)
Basophils Relative: 0 %
Eosinophils Absolute: 0.1 10*3/uL (ref 0.0–0.7)
Eosinophils Relative: 0 %
HCT: 37.6 % (ref 36.0–46.0)
Hemoglobin: 13.1 g/dL (ref 12.0–15.0)
Lymphocytes Relative: 30 %
Lymphs Abs: 3.5 10*3/uL (ref 0.7–4.0)
MCH: 29.8 pg (ref 26.0–34.0)
MCHC: 34.8 g/dL (ref 30.0–36.0)
MCV: 85.5 fL (ref 78.0–100.0)
Monocytes Absolute: 0.6 10*3/uL (ref 0.1–1.0)
Monocytes Relative: 5 %
Neutro Abs: 7.4 10*3/uL (ref 1.7–7.7)
Neutrophils Relative %: 65 %
Platelets: 183 10*3/uL (ref 150–400)
RBC: 4.4 MIL/uL (ref 3.87–5.11)
RDW: 14.9 % (ref 11.5–15.5)
WBC: 11.5 10*3/uL — ABNORMAL HIGH (ref 4.0–10.5)

## 2017-05-23 LAB — COMPREHENSIVE METABOLIC PANEL
ALT: 26 U/L (ref 14–54)
AST: 20 U/L (ref 15–41)
Albumin: 3.4 g/dL — ABNORMAL LOW (ref 3.5–5.0)
Alkaline Phosphatase: 30 U/L — ABNORMAL LOW (ref 38–126)
Anion gap: 14 (ref 5–15)
BUN: 7 mg/dL (ref 6–20)
CO2: 25 mmol/L (ref 22–32)
Calcium: 8.2 mg/dL — ABNORMAL LOW (ref 8.9–10.3)
Chloride: 91 mmol/L — ABNORMAL LOW (ref 101–111)
Creatinine, Ser: 0.74 mg/dL (ref 0.44–1.00)
GFR calc Af Amer: >60 mL/min (ref >60)
GFR calc non Af Amer: >60 mL/min (ref >60)
Glucose, Bld: 119 mg/dL — ABNORMAL HIGH (ref 65–99)
Potassium: 3.6 mmol/L (ref 3.5–5.1)
Sodium: 130 mmol/L — ABNORMAL LOW (ref 135–145)
Total Bilirubin: 0.8 mg/dL (ref 0.3–1.2)
Total Protein: 6.4 g/dL — ABNORMAL LOW (ref 6.5–8.1)

## 2017-05-23 LAB — I-STAT ARTERIAL BLOOD GAS, ED
Acid-Base Excess: 3 mmol/L — ABNORMAL HIGH (ref 0.0–2.0)
Bicarbonate: 28 mmol/L (ref 20.0–28.0)
O2 Saturation: 94 %
Patient temperature: 98.6
TCO2: 29 mmol/L (ref 22–32)
pCO2 arterial: 41.4 mmHg (ref 32.0–48.0)
pH, Arterial: 7.438 (ref 7.350–7.450)
pO2, Arterial: 70 mmHg — ABNORMAL LOW (ref 83.0–108.0)

## 2017-05-23 LAB — RAPID URINE DRUG SCREEN, HOSP PERFORMED
Amphetamines: NOT DETECTED
Barbiturates: NOT DETECTED
Benzodiazepines: NOT DETECTED
Cocaine: NOT DETECTED
Opiates: NOT DETECTED
Tetrahydrocannabinol: NOT DETECTED

## 2017-05-23 LAB — AMMONIA: Ammonia: 28 umol/L (ref 9–35)

## 2017-05-23 LAB — D-DIMER, QUANTITATIVE: D-Dimer, Quant: 14.39 ug/mL-FEU — ABNORMAL HIGH (ref 0.00–0.50)

## 2017-05-23 MED ORDER — ACETAMINOPHEN 325 MG PO TABS
650.0000 mg | ORAL_TABLET | Freq: Once | ORAL | Status: AC
  Administered 2017-05-23: 650 mg via ORAL
  Filled 2017-05-23: qty 2

## 2017-05-23 MED ORDER — IOPAMIDOL (ISOVUE-370) INJECTION 76%
100.0000 mL | Freq: Once | INTRAVENOUS | Status: AC | PRN
  Administered 2017-05-23: 100 mL via INTRAVENOUS

## 2017-05-23 MED ORDER — SODIUM CHLORIDE 0.9 % IV BOLUS
1000.0000 mL | Freq: Once | INTRAVENOUS | Status: AC
  Administered 2017-05-23: 1000 mL via INTRAVENOUS

## 2017-05-23 MED ORDER — IOPAMIDOL (ISOVUE-370) INJECTION 76%
INTRAVENOUS | Status: AC
  Filled 2017-05-23: qty 100

## 2017-05-23 NOTE — Discharge Instructions (Addendum)
You have had a very thorough and reassuring workup in the emergency department today.  We recommend that you follow-up with your primary care doctor in the next 48-72 hours for recheck of your symptoms and electrolytes.  You were hydrated in the emergency department with IV fluids.  Continue use of your home walker whenever you are mobile.  You may return for new or concerning symptoms.

## 2017-05-23 NOTE — ED Notes (Signed)
Patient ambulated in halls with Selena BattenKim, ED RN, initially gait was shuffled, but patient states she is usually stiff after standing.  Patient able to ambulate independently

## 2017-05-23 NOTE — ED Notes (Signed)
Patient provided with [place] sandwich and sprite, okay'd by PA

## 2017-05-23 NOTE — ED Provider Notes (Signed)
[facility] EMERGENCY DEPARTMENT Provider Note   CSN: [id number] Arrival date & time: 05/23/17  0900     History   Chief Complaint Chief Complaint  Patient presents with  . Fall    HPI KARRIE FLUELLEN is a 67 y.o. female.  HPI ELISHIA KACZOROWSKI is a 67 y.o. female presents to emergency department complaining of frequent falls and left hip pain.  Patient was seen last night for several falls.  She apparently was brought here by EMS, with altered level consciousness.  She had extensive workup including labs, urinalysis, ABG, CT Angie of the chest because of the elevated d-dimer, was monitored, she became more alert, her tests came back unremarkable other than recurrent hyponatremia, she was discharged home.  Patient states that she could not get in touch with her family as she was leaving, she states she has a sister in town, she had no ride home, she decided to just keep walking out of the hospital, and states that she fell twice again in a parking lot.  She denies feeling dizzy or lightheaded prior to the fall.  She states "my legs just start shaking and give out on me."  She did not hit her head during the fall since she remembers everything that happens.  She denies loss of consciousness.  She states that she now has worsening pain in her left hip.  She states that she has had pain in that hip for several weeks, from prior falls.  She still able to walk on it.  She states that sometimes she uses cane.  She does not have a walker.  She lives alone.  She states that she thinks all his falls are because she is not prescribed Xanax anymore.  Past Medical History:  Diagnosis Date  . Anxiety   . Anxiety   . Arthritis   . Depression   . Grief 07/03/2011  . Hyperlipidemia   . Hypertension     Patient Active Problem List   Diagnosis Date Noted  . Near syncope 04/28/2017  . Dehydration with hyponatremia 04/28/2017  . Pneumonia 02/07/2013  . Hypokalemia 07/03/2011  .  Grief 07/03/2011  . Encephalopathy 07/02/2011  . Hyperlipidemia 07/02/2011  . Anxiety 07/02/2011  . Overdose 07/01/2011  . Altered mental status 07/01/2011  . Hypertension 07/01/2011  . Depression 07/01/2011  . UTI (lower urinary tract infection) 07/01/2011    Past Surgical History:  Procedure Laterality Date  . CHOLECYSTECTOMY       OB History   None      Home Medications    Prior to Admission medications   Medication Sig Start Date End Date Taking? Authorizing Provider  albuterol (PROVENTIL HFA;VENTOLIN HFA) 108 (90 BASE) MCG/ACT inhaler Inhale 2 puffs into the lungs every 6 (six) hours as needed for shortness of breath. For shortness of breath. 02/12/13   Jeanella Craze, NP  albuterol (PROVENTIL) (2.5 MG/3ML) 0.083% nebulizer solution Take 3 mLs (2.5 mg total) by nebulization every 4 (four) hours as needed for wheezing or shortness of breath. Use neb or proventil but not both at the same time 02/12/13   Jeanella Craze, NP  amitriptyline (ELAVIL) 150 MG tablet Take 1 tablet by mouth daily. 01/15/13   [provider]  atorvastatin (LIPITOR) 80 MG tablet Take 80 mg by mouth daily.     [provider]  escitalopram (LEXAPRO) 20 MG tablet Take 40 mg by mouth daily.    [provider]  fluticasone Aleda Grana)  50 MCG/ACT nasal spray Place 2 sprays into both nostrils daily. 05/02/17   Albertine Grates, MD  furosemide (LASIX) 40 MG tablet Take 1 tablet (40 mg total) by mouth daily as needed for fluid or edema. 05/01/17   Albertine Grates, MD  gabapentin (NEURONTIN) 600 MG tablet Take 1 tablet (600 mg total) by mouth at bedtime. 05/01/17   Albertine Grates, MD  guaiFENesin (MUCINEX) 600 MG 12 hr tablet Take 1 tablet (600 mg total) by mouth 2 (two) times daily. 05/01/17   Albertine Grates, MD  hydrOXYzine (ATARAX/VISTARIL) 10 MG tablet Take 1 tablet (10 mg total) by mouth 3 (three) times daily as needed for anxiety. 05/01/17   Albertine Grates, MD  magnesium oxide (MAG-OX) 400 MG tablet Take 400 mg by mouth 3  (three) times daily.    [provider]  metFORMIN (GLUCOPHAGE) 1000 MG tablet Take 1,000 mg by mouth 2 (two) times daily with a meal.    [provider]  omeprazole (PRILOSEC) 20 MG capsule Take 20 mg by mouth daily.    [provider]  ramipril (ALTACE) 2.5 MG capsule Take 2.5 mg by mouth daily.    [provider]  tiotropium (SPIRIVA) 18 MCG inhalation capsule Place 1 capsule (18 mcg total) into inhaler and inhale daily. 02/12/13   Jeanella Craze, NP  traMADol (ULTRAM) 50 MG tablet Take 50 mg by mouth every 6 (six) hours as needed for moderate pain.    [provider]    Family History Family History  Problem Relation Age of Onset  . Coronary artery disease Father   . Diabetes type II Sister   . Hypertension Mother     Social History Social History   Tobacco Use  . Smoking status: Former Smoker    Packs/day: 1.00    Years: 40.00    Pack years: 40.00    Types: Cigarettes  . Smokeless tobacco: Never Used  Substance Use Topics  . Alcohol use: Yes    Comment: occasionally  . Drug use: No     Allergies   Bee venom and Codeine   Review of Systems Review of Systems  Constitutional: Negative for chills and fever.  Respiratory: Negative for cough, chest tightness and shortness of breath.   Cardiovascular: Negative for chest pain, palpitations and leg swelling.  Gastrointestinal: Negative for abdominal pain, diarrhea, nausea and vomiting.  Genitourinary: Negative for dysuria, flank pain and pelvic pain.  Musculoskeletal: Positive for arthralgias and gait problem. Negative for myalgias, neck pain and neck stiffness.  Skin: Negative for rash.  Neurological: Positive for weakness. Negative for dizziness, syncope and headaches.  All other systems reviewed and are negative.    Physical Exam Updated Vital Signs BP 133/78 (BP Location: Left Arm)   Pulse 98   Temp 98.6 F (37 C) (Oral)   Resp 16   SpO2 92%   Physical Exam    Constitutional: She is oriented to person, place, and time. She appears well-developed and well-nourished. No distress.  HENT:  Head: Normocephalic.  Eyes: Conjunctivae are normal.  Neck: Neck supple.  Cardiovascular: Normal rate, regular rhythm and normal heart sounds.  Pulmonary/Chest: Effort normal and breath sounds normal. No respiratory distress. She has no wheezes. She has no rales.  Abdominal: Soft. Bowel sounds are normal. She exhibits no distension. There is no tenderness. There is no rebound.  Musculoskeletal: She exhibits no edema.  Tenderness to palpation left hip.  No bruising or deformity.  Pain with any range of motion  of the hip joint.  Normal knee.  Patient with multiple contusions all over her body.  Neurological: She is alert and oriented to person, place, and time. No cranial nerve deficit. Coordination normal.  Skin: Skin is warm and dry.  Psychiatric: She has a normal mood and affect. Her behavior is normal.  Nursing note and vitals reviewed.    ED Treatments / Results  Labs (all labs ordered are listed, but only abnormal results are displayed) Labs Reviewed  CBC WITH DIFFERENTIAL/PLATELET  COMPREHENSIVE METABOLIC PANEL    EKG None  Radiology Dg Chest 2 View  Result Date: 05/22/2017 CLINICAL DATA:  67 y/o  F; 2 falls today. EXAM: CHEST - 2 VIEW COMPARISON:  04/28/2017 chest radiograph FINDINGS: Stable cardiac silhouette given projection and technique. Aortic atherosclerosis with calcification. Clear lungs. No pleural effusion or pneumothorax. No acute osseous abnormality is evident. IMPRESSION: No acute pulmonary process identified.  Aortic atherosclerosis. Electronically Signed   By: Mitzi Hansen M.D.   On: 05/22/2017 22:47   Dg Knee 2 Views Left  Result Date: 05/22/2017 CLINICAL DATA:  Pt States she has fallen 2x today at home and doesn't remember what caused the falls. She reports left hip and bilateral knee pain. Abrasion to left anterior  knee. Left hurts worse than right. Both hurt "all over" Chronic coug.*comment was truncated* EXAM: LEFT KNEE - 1-2 VIEW COMPARISON:  None. FINDINGS: There is narrowing of the medial compartment with osteophytosis. Spurring of the patella. No joint effusion. No fracture. IMPRESSION: Osteoarthritis of the medial compartment.  No acute findings. Electronically Signed   By: Genevive Bi M.D.   On: 05/22/2017 22:38   Dg Knee 2 Views Right  Result Date: 05/22/2017 CLINICAL DATA:  Pt States she has fallen 2x today at home and doesn't remember what caused the falls. She reports left hip and bilateral knee pain. Abrasion to left knee. Left hurts worse than right. Chronic cough - current smoker of 1 pk per.*comment was truncated* EXAM: RIGHT KNEE - 1-2 VIEW COMPARISON:  None. FINDINGS: Narrowing of the lateral compartment osteophytosis. Osteophytosis of the medial compartment. Spurring of the patella. No joint effusion. 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 No acute findings. Electronically Signed   By: Genevive Bi M.D.   On: 05/22/2017 22:40   Ct Head Wo Contrast  Result Date: 05/22/2017 CLINICAL DATA:  Status post 2 falls today, with left cheek abrasion. Concern for head or cervical spine injury. Initial encounter. EXAM: CT HEAD WITHOUT CONTRAST CT MAXILLOFACIAL WITHOUT CONTRAST CT CERVICAL SPINE WITHOUT CONTRAST TECHNIQUE: Multidetector CT imaging of the head, cervical spine, and maxillofacial structures were performed using the standard protocol without intravenous contrast. Multiplanar CT image reconstructions of the cervical spine and maxillofacial structures were also generated. COMPARISON:  CT of the head performed 04/28/2017, and CT of the head and cervical spine performed 11/15/2009. MRI of the brain and neck performed 11/17/2009 FINDINGS: CT HEAD FINDINGS Brain: No evidence of acute infarction, hemorrhage, hydrocephalus, extra-axial collection or mass  lesion/mass effect. A cavum septum pellucidum is noted. The posterior fossa, including the cerebellum, brainstem and fourth ventricle, is within normal limits. The third and lateral ventricles, and basal ganglia are unremarkable in appearance. The cerebral hemispheres are symmetric in appearance, with normal gray-white differentiation. No mass effect or midline shift is seen. Vascular: No hyperdense vessel or unexpected calcification. Skull: There is no evidence of fracture; visualized osseous structures are unremarkable in appearance. Other: No significant soft tissue abnormalities are seen. CT  MAXILLOFACIAL FINDINGS Osseous: There is no evidence of fracture or dislocation. The maxilla and mandible appear intact. The nasal bone is unremarkable in appearance. There is chronic absence of the dentition. Orbits: The orbits are intact bilaterally. Sinuses: Mucosal thickening is noted at the maxillary sinuses bilaterally. The remaining visualized paranasal sinuses and mastoid air cells are well-aerated. Soft tissues: No significant soft tissue abnormalities are seen. The parapharyngeal fat planes are preserved. The nasopharynx, oropharynx and hypopharynx are unremarkable in appearance. The visualized portions of the valleculae and piriform sinuses are grossly unremarkable. The parotid and submandibular glands are within normal limits. No cervical lymphadenopathy is seen. CT CERVICAL SPINE FINDINGS Alignment: Normal. Skull base and vertebrae: No acute fracture. No primary bone lesion or focal pathologic process. Soft tissues and spinal canal: No prevertebral fluid or swelling. No visible canal hematoma. Disc levels: Intervertebral disc spaces are grossly preserved. Scattered anterior and posterior disc osteophyte complexes are noted along the cervical spine. Mild degenerative change is noted at the dens. Upper chest: There is asymmetric prominence of the right thyroid lobe, with a suspected 1.8 cm nodule. The visualized  lung apices are clear. Other: No additional soft tissue abnormalities are seen. IMPRESSION: 1. No evidence of traumatic intracranial injury or fracture. 2. No evidence of fracture or subluxation along the cervical spine. 3. No evidence of fracture or dislocation with regard to the maxillofacial structures. 4. Mucosal thickening at the maxillary sinuses bilaterally. 5. Mild degenerative change along the cervical spine. 6. **An incidental finding of potential clinical significance has been found. Asymmetric prominence of the right thyroid lobe, with a suspected 1.8 cm nodule. Consider further evaluation with thyroid ultrasound. If patient is clinically hyperthyroid, consider nuclear medicine thyroid uptake and scan.** Electronically Signed   By: Roanna Raider M.D.   On: 05/22/2017 22:12   Ct Angio Chest Pe W And/or Wo Contrast  Result Date: 05/23/2017 CLINICAL DATA:  Slurred speech and confusion. Multiple falls today. Elevated D-dimer. EXAM: CT ANGIOGRAPHY CHEST WITH CONTRAST TECHNIQUE: Multidetector CT imaging of the chest was performed using the standard protocol during bolus administration of intravenous contrast. Multiplanar CT image reconstructions and MIPs were obtained to evaluate the vascular anatomy. CONTRAST:  ISOVUE-370 IOPAMIDOL (ISOVUE-370) INJECTION 76% COMPARISON:  Chest radiograph April 21, 2017 FINDINGS: CARDIOVASCULAR: Adequate contrast opacification of the pulmonary artery's. Main pulmonary artery is not enlarged. No pulmonary arterial filling defects to the level of the subsegmental branches. Heart size is upper limits of normal, no right heart strain. Mild coronary artery calcifications. Trace pericardial effusion. Thoracic aorta is normal course and caliber, mild calcific atherosclerosis aortic arch. LEFT vertebral artery arises directly from the aortic arch. MEDIASTINUM/NODES: No lymphadenopathy by CT size criteria. LUNGS/PLEURA: Tracheobronchial tree is patent, no pneumothorax.  Moderate bronchial wall thickening. LEFT lower lobe atelectasis. No pleural effusions, focal consolidations, pulmonary nodules or masses. UPPER ABDOMEN: Nonacute. 12 mm benign LEFT adrenal adenoma (0 Hounsfield units). Status post cholecystectomy MUSCULOSKELETAL: Mildly accentuated thoracic kyphosis with multilevel bridging ventral osteophytes. Review of the MIP images confirms the above findings. IMPRESSION: 1. No acute pulmonary embolism. 2. Bronchial wall thickening seen with bronchitis or reactive airway disease. No pneumonia. Aortic Atherosclerosis (ICD10-I70.0). Electronically Signed   By: Awilda Metro M.D.   On: 05/23/2017 04:14   Ct Cervical Spine Wo Contrast  Result Date: 05/22/2017 CLINICAL DATA:  Status post 2 falls today, with left cheek abrasion. Concern for head or cervical spine injury. Initial encounter. EXAM: CT HEAD WITHOUT CONTRAST CT MAXILLOFACIAL WITHOUT CONTRAST CT CERVICAL  SPINE WITHOUT CONTRAST TECHNIQUE: Multidetector CT imaging of the head, cervical spine, and maxillofacial structures were performed using the standard protocol without intravenous contrast. Multiplanar CT image reconstructions of the cervical spine and maxillofacial structures were also generated. COMPARISON:  CT of the head performed 04/28/2017, and CT of the head and cervical spine performed 11/15/2009. MRI of the brain and neck performed 11/17/2009 FINDINGS: CT HEAD FINDINGS Brain: No evidence of acute infarction, hemorrhage, hydrocephalus, extra-axial collection or mass lesion/mass effect. A cavum septum pellucidum is noted. The posterior fossa, including the cerebellum, brainstem and fourth ventricle, is within normal limits. The third and lateral ventricles, and basal ganglia are unremarkable in appearance. The cerebral hemispheres are symmetric in appearance, with normal gray-white differentiation. No mass effect or midline shift is seen. Vascular: No hyperdense vessel or unexpected calcification. Skull:  There is no evidence of fracture; visualized osseous structures are unremarkable in appearance. Other: No significant soft tissue abnormalities are seen. CT MAXILLOFACIAL FINDINGS Osseous: There is no evidence of fracture or dislocation. The maxilla and mandible appear intact. The nasal bone is unremarkable in appearance. There is chronic absence of the dentition. Orbits: The orbits are intact bilaterally. Sinuses: Mucosal thickening is noted at the maxillary sinuses bilaterally. The remaining visualized paranasal sinuses and mastoid air cells are well-aerated. Soft tissues: No significant soft tissue abnormalities are seen. The parapharyngeal fat planes are preserved. The nasopharynx, oropharynx and hypopharynx are unremarkable in appearance. The visualized portions of the valleculae and piriform sinuses are grossly unremarkable. The parotid and submandibular glands are within normal limits. No cervical lymphadenopathy is seen. CT CERVICAL SPINE FINDINGS Alignment: Normal. Skull base and vertebrae: No acute fracture. No primary bone lesion or focal pathologic process. Soft tissues and spinal canal: No prevertebral fluid or swelling. No visible canal hematoma. Disc levels: Intervertebral disc spaces are grossly preserved. Scattered anterior and posterior disc osteophyte complexes are noted along the cervical spine. Mild degenerative change is noted at the dens. Upper chest: There is asymmetric prominence of the right thyroid lobe, with a suspected 1.8 cm nodule. The visualized lung apices are clear. Other: No additional soft tissue abnormalities are seen. IMPRESSION: 1. No evidence of traumatic intracranial injury or fracture. 2. No evidence of fracture or subluxation along the cervical spine. 3. No evidence of fracture or dislocation with regard to the maxillofacial structures. 4. Mucosal thickening at the maxillary sinuses bilaterally. 5. Mild degenerative change along the cervical spine. 6. **An incidental  finding of potential clinical significance has been found. Asymmetric prominence of the right thyroid lobe, with a suspected 1.8 cm nodule. Consider further evaluation with thyroid ultrasound. If patient is clinically hyperthyroid, consider nuclear medicine thyroid uptake and scan.** Electronically Signed   By: Roanna Raider M.D.   On: 05/22/2017 22:12   Dg Hip Unilat With Pelvis 2-3 Views Left  Result Date: 05/23/2017 CLINICAL DATA:  Left hip pain after fall a few days ago. EXAM: DG HIP (WITH OR WITHOUT PELVIS) 2-3V LEFT COMPARISON:  Left hip x-rays from yesterday. FINDINGS: No acute fracture or dislocation. The pubic symphysis and sacroiliac joints are intact. The bilateral hip joint spaces are preserved. Osteopenia. Contrast within the bladder. IMPRESSION: 1. No acute osseous abnormality. If occult hip fracture is suspected or if the patient is unable to bear weight, MRI is the preferred modality for further evaluation. Electronically Signed   By: Obie Dredge M.D.   On: 05/23/2017 12:01   Dg Hip Unilat W Or Wo Pelvis 2-3 Views Left  Result Date:  05/22/2017 CLINICAL DATA:  Pt States she has fallen 2x today at home and doesn't remember what caused the falls. She reports left hip and bilateral knee pain. Abrasion to left knee. Left hurts worse than right. Chronic cough - current smoker of 1 pk per.*comment was truncated* EXAM: DG HIP (WITH OR WITHOUT PELVIS) 2-3V LEFT COMPARISON:  None. FINDINGS: Hips are located. No evidence of pelvic fracture or sacral fracture. Dedicated view of the LEFT hip demonstrates no femoral neck fracture. IMPRESSION: No pelvic fracture or hip fracture. Electronically Signed   By: Genevive BiStewart  Edmunds M.D.   On: 05/22/2017 22:42   Ct Maxillofacial Wo Contrast  Result Date: 05/22/2017 CLINICAL DATA:  Status post 2 falls today, with left cheek abrasion. Concern for head or cervical spine injury. Initial encounter. EXAM: CT HEAD WITHOUT CONTRAST CT MAXILLOFACIAL WITHOUT CONTRAST  CT CERVICAL SPINE WITHOUT CONTRAST TECHNIQUE: Multidetector CT imaging of the head, cervical spine, and maxillofacial structures were performed using the standard protocol without intravenous contrast. Multiplanar CT image reconstructions of the cervical spine and maxillofacial structures were also generated. COMPARISON:  CT of the head performed 04/28/2017, and CT of the head and cervical spine performed 11/15/2009. MRI of the brain and neck performed 11/17/2009 FINDINGS: CT HEAD FINDINGS Brain: No evidence of acute infarction, hemorrhage, hydrocephalus, extra-axial collection or mass lesion/mass effect. A cavum septum pellucidum is noted. The posterior fossa, including the cerebellum, brainstem and fourth ventricle, is within normal limits. The third and lateral ventricles, and basal ganglia are unremarkable in appearance. The cerebral hemispheres are symmetric in appearance, with normal gray-white differentiation. No mass effect or midline shift is seen. Vascular: No hyperdense vessel or unexpected calcification. Skull: There is no evidence of fracture; visualized osseous structures are unremarkable in appearance. Other: No significant soft tissue abnormalities are seen. CT MAXILLOFACIAL FINDINGS Osseous: There is no evidence of fracture or dislocation. The maxilla and mandible appear intact. The nasal bone is unremarkable in appearance. There is chronic absence of the dentition. Orbits: The orbits are intact bilaterally. Sinuses: Mucosal thickening is noted at the maxillary sinuses bilaterally. The remaining visualized paranasal sinuses and mastoid air cells are well-aerated. Soft tissues: No significant soft tissue abnormalities are seen. The parapharyngeal fat planes are preserved. The nasopharynx, oropharynx and hypopharynx are unremarkable in appearance. The visualized portions of the valleculae and piriform sinuses are grossly unremarkable. The parotid and submandibular glands are within normal limits. No  cervical lymphadenopathy is seen. CT CERVICAL SPINE FINDINGS Alignment: Normal. Skull base and vertebrae: No acute fracture. No primary bone lesion or focal pathologic process. Soft tissues and spinal canal: No prevertebral fluid or swelling. No visible canal hematoma. Disc levels: Intervertebral disc spaces are grossly preserved. Scattered anterior and posterior disc osteophyte complexes are noted along the cervical spine. Mild degenerative change is noted at the dens. Upper chest: There is asymmetric prominence of the right thyroid lobe, with a suspected 1.8 cm nodule. The visualized lung apices are clear. Other: No additional soft tissue abnormalities are seen. IMPRESSION: 1. No evidence of traumatic intracranial injury or fracture. 2. No evidence of fracture or subluxation along the cervical spine. 3. No evidence of fracture or dislocation with regard to the maxillofacial structures. 4. Mucosal thickening at the maxillary sinuses bilaterally. 5. Mild degenerative change along the cervical spine. 6. **An incidental finding of potential clinical significance has been found. Asymmetric prominence of the right thyroid lobe, with a suspected 1.8 cm nodule. Consider further evaluation with thyroid ultrasound. If patient is clinically hyperthyroid, consider  nuclear medicine thyroid uptake and scan.** Electronically Signed   By: Roanna Raider M.D.   On: 05/22/2017 22:12    Procedures Procedures (including critical care time)  Medications Ordered in ED Medications - No data to display   Initial Impression / Assessment and Plan / ED Course  I have reviewed the triage vital signs and the nursing notes.  Pertinent labs & imaging results that were available during my care of the patient were reviewed by me and considered in my medical decision making (see chart for details).     Patient is back in emergency department after being discharged this morning with another 2 falls when walking in the parking lot.   Denies any syncope, no loss of consciousness, no dizziness, no head injury.  States "my legs just give out."  Exam unremarkable other than left hip tenderness.  X-ray shows no acute fractures.  I ambulated patient, she is able to ambulate and able to fully bear weight on her hip.  Tylenol was ordered for pain.  Labs repeated due to hyponatremia yesterday.  Labs show sodium 130 today which is much improved from 125.  Gap improved to, currently 14.  Sounds like patient was altered last night and had an extensive workup for her altered mental status.  According to EMS note, patient was found him on scattered pills on her floor.  Patient states she does take psychiatric medications and states maybe she took too many.  She is currently alert and oriented.  She is eating and drinking with no difficulty.  Her exam is really unremarkable.  I offered her a walker but she stated she had one at home.  I advised her to use a walker when ambulating so she does not fall anymore.  She has a sister who will come and get her from the hospital.  She will call her.  I advised her to see if she can stay with her sister for a couple days until she feels better.  At this time she is stable for discharge home.  She does not require any further testing in emergency department or admission. Return precautions discussed.   Vitals:   05/23/17 1140 05/23/17 1343 05/23/17 1540  BP: 122/74 140/71 133/78  Pulse: 100 98 98  Resp: 17 18 16   Temp: 98.7 F (37.1 C) 99.1 F (37.3 C) 98.6 F (37 C)  TempSrc: Oral Oral Oral  SpO2: 93% 92% 92%     Final Clinical Impressions(s) / ED Diagnoses   Final diagnoses:  Fall, initial encounter    ED Discharge Orders    None       Jaynie Crumble, PA-C 05/23/17 1900    Margarita Grizzle, MD 05/25/17 [id number]

## 2017-05-23 NOTE — Discharge Instructions (Addendum)
Use walker for ambulation. Tylenol for pain. Please see your doctor in the next 3 days for re evaluation.

## 2017-05-23 NOTE — ED Notes (Signed)
Patient transported to CT 

## 2017-05-23 NOTE — ED Notes (Signed)
Pt has decided to leave 

## 2017-05-23 NOTE — ED Notes (Signed)
Patient able to ambulate independently  

## 2017-05-23 NOTE — ED Triage Notes (Signed)
Pt states she was leaving the hospital this morning and tripped over the sidewalk and fell on her left hip. Denies LOC. Did not hit her head. Pt alert and oriented.

## 2017-05-25 ENCOUNTER — Other Ambulatory Visit: Payer: Self-pay

## 2017-05-25 ENCOUNTER — Emergency Department (HOSPITAL_COMMUNITY): Payer: Medicare Other

## 2017-05-25 ENCOUNTER — Inpatient Hospital Stay (HOSPITAL_COMMUNITY)
Admission: EM | Admit: 2017-05-25 | Discharge: 2017-05-27 | DRG: 640 | Disposition: A | Discharge status: Home / Self Care | Payer: Medicare Other | Attending: Internal Medicine | Admitting: Internal Medicine

## 2017-05-25 ENCOUNTER — Encounter (HOSPITAL_COMMUNITY): Payer: Self-pay | Admitting: Emergency Medicine

## 2017-05-25 DIAGNOSIS — Z716 Tobacco abuse counseling: Secondary | ICD-10-CM

## 2017-05-25 DIAGNOSIS — R4182 Altered mental status, unspecified: Secondary | ICD-10-CM | POA: Diagnosis present

## 2017-05-25 DIAGNOSIS — I10 Essential (primary) hypertension: Secondary | ICD-10-CM | POA: Diagnosis present

## 2017-05-25 DIAGNOSIS — R296 Repeated falls: Secondary | ICD-10-CM | POA: Diagnosis present

## 2017-05-25 DIAGNOSIS — K08109 Complete loss of teeth, unspecified cause, unspecified class: Secondary | ICD-10-CM | POA: Diagnosis present

## 2017-05-25 DIAGNOSIS — R0609 Other forms of dyspnea: Secondary | ICD-10-CM | POA: Diagnosis present

## 2017-05-25 DIAGNOSIS — F329 Major depressive disorder, single episode, unspecified: Secondary | ICD-10-CM | POA: Diagnosis present

## 2017-05-25 DIAGNOSIS — R0602 Shortness of breath: Secondary | ICD-10-CM | POA: Diagnosis not present

## 2017-05-25 DIAGNOSIS — F419 Anxiety disorder, unspecified: Secondary | ICD-10-CM | POA: Diagnosis present

## 2017-05-25 DIAGNOSIS — Z9181 History of falling: Secondary | ICD-10-CM

## 2017-05-25 DIAGNOSIS — Z7984 Long term (current) use of oral hypoglycemic drugs: Secondary | ICD-10-CM | POA: Diagnosis not present

## 2017-05-25 DIAGNOSIS — E119 Type 2 diabetes mellitus without complications: Secondary | ICD-10-CM | POA: Diagnosis present

## 2017-05-25 DIAGNOSIS — Z66 Do not resuscitate: Secondary | ICD-10-CM | POA: Diagnosis present

## 2017-05-25 DIAGNOSIS — G9341 Metabolic encephalopathy: Secondary | ICD-10-CM | POA: Diagnosis present

## 2017-05-25 DIAGNOSIS — Z833 Family history of diabetes mellitus: Secondary | ICD-10-CM | POA: Diagnosis not present

## 2017-05-25 DIAGNOSIS — F17200 Nicotine dependence, unspecified, uncomplicated: Secondary | ICD-10-CM | POA: Diagnosis not present

## 2017-05-25 DIAGNOSIS — Y92009 Unspecified place in unspecified non-institutional (private) residence as the place of occurrence of the external cause: Secondary | ICD-10-CM

## 2017-05-25 DIAGNOSIS — E861 Hypovolemia: Secondary | ICD-10-CM | POA: Diagnosis present

## 2017-05-25 DIAGNOSIS — R0600 Dyspnea, unspecified: Secondary | ICD-10-CM | POA: Diagnosis present

## 2017-05-25 DIAGNOSIS — Z9049 Acquired absence of other specified parts of digestive tract: Secondary | ICD-10-CM

## 2017-05-25 DIAGNOSIS — E860 Dehydration: Secondary | ICD-10-CM | POA: Diagnosis present

## 2017-05-25 DIAGNOSIS — W19XXXA Unspecified fall, initial encounter: Secondary | ICD-10-CM

## 2017-05-25 DIAGNOSIS — E871 Hypo-osmolality and hyponatremia: Secondary | ICD-10-CM | POA: Diagnosis present

## 2017-05-25 DIAGNOSIS — Z885 Allergy status to narcotic agent status: Secondary | ICD-10-CM

## 2017-05-25 DIAGNOSIS — E876 Hypokalemia: Secondary | ICD-10-CM | POA: Diagnosis present

## 2017-05-25 DIAGNOSIS — R410 Disorientation, unspecified: Secondary | ICD-10-CM | POA: Diagnosis not present

## 2017-05-25 DIAGNOSIS — J441 Chronic obstructive pulmonary disease with (acute) exacerbation: Secondary | ICD-10-CM | POA: Diagnosis present

## 2017-05-25 DIAGNOSIS — E118 Type 2 diabetes mellitus with unspecified complications: Secondary | ICD-10-CM | POA: Diagnosis not present

## 2017-05-25 DIAGNOSIS — E785 Hyperlipidemia, unspecified: Secondary | ICD-10-CM | POA: Diagnosis present

## 2017-05-25 DIAGNOSIS — F17210 Nicotine dependence, cigarettes, uncomplicated: Secondary | ICD-10-CM | POA: Diagnosis present

## 2017-05-25 DIAGNOSIS — Z8249 Family history of ischemic heart disease and other diseases of the circulatory system: Secondary | ICD-10-CM

## 2017-05-25 DIAGNOSIS — Z79899 Other long term (current) drug therapy: Secondary | ICD-10-CM

## 2017-05-25 DIAGNOSIS — Z972 Presence of dental prosthetic device (complete) (partial): Secondary | ICD-10-CM | POA: Diagnosis not present

## 2017-05-25 DIAGNOSIS — Z91030 Bee allergy status: Secondary | ICD-10-CM

## 2017-05-25 LAB — CBC
HCT: 36.8 % (ref 36.0–46.0)
Hemoglobin: 13.3 g/dL (ref 12.0–15.0)
MCH: 30.2 pg (ref 26.0–34.0)
MCHC: 36.1 g/dL — ABNORMAL HIGH (ref 30.0–36.0)
MCV: 83.4 fL (ref 78.0–100.0)
Platelets: 226 10*3/uL (ref 150–400)
RBC: 4.41 MIL/uL (ref 3.87–5.11)
RDW: 14.7 % (ref 11.5–15.5)
WBC: 13.9 10*3/uL — ABNORMAL HIGH (ref 4.0–10.5)

## 2017-05-25 LAB — COMPREHENSIVE METABOLIC PANEL
ALT: 22 U/L (ref 14–54)
AST: 24 U/L (ref 15–41)
Albumin: 3.7 g/dL (ref 3.5–5.0)
Alkaline Phosphatase: 27 U/L — ABNORMAL LOW (ref 38–126)
Anion gap: 14 (ref 5–15)
BUN: 5 mg/dL — ABNORMAL LOW (ref 6–20)
CO2: 25 mmol/L (ref 22–32)
Calcium: 8.5 mg/dL — ABNORMAL LOW (ref 8.9–10.3)
Chloride: 84 mmol/L — ABNORMAL LOW (ref 101–111)
Creatinine, Ser: 0.87 mg/dL (ref 0.44–1.00)
GFR calc Af Amer: >60 mL/min (ref >60)
GFR calc non Af Amer: >60 mL/min (ref >60)
Glucose, Bld: 92 mg/dL (ref 65–99)
Potassium: 3.7 mmol/L (ref 3.5–5.1)
Sodium: 123 mmol/L — ABNORMAL LOW (ref 135–145)
Total Bilirubin: 1.7 mg/dL — ABNORMAL HIGH (ref 0.3–1.2)
Total Protein: 6.9 g/dL (ref 6.5–8.1)

## 2017-05-25 LAB — SODIUM, URINE, RANDOM: Sodium, Ur: 24 mmol/L

## 2017-05-25 LAB — OSMOLALITY, URINE: Osmolality, Ur: 151 mOsm/kg — ABNORMAL LOW (ref 300–900)

## 2017-05-25 LAB — OSMOLALITY: Osmolality: 258 mOsm/kg — ABNORMAL LOW (ref 275–295)

## 2017-05-25 MED ORDER — ATORVASTATIN CALCIUM 80 MG PO TABS
80.0000 mg | ORAL_TABLET | Freq: Every day | ORAL | Status: DC
  Administered 2017-05-26 – 2017-05-27 (×2): 80 mg via ORAL
  Filled 2017-05-25 (×2): qty 1

## 2017-05-25 MED ORDER — BUSPIRONE HCL 5 MG PO TABS
5.0000 mg | ORAL_TABLET | Freq: Three times a day (TID) | ORAL | Status: DC
  Administered 2017-05-25 – 2017-05-27 (×5): 5 mg via ORAL
  Filled 2017-05-25 (×7): qty 1

## 2017-05-25 MED ORDER — SODIUM CHLORIDE 0.9 % IV SOLN
INTRAVENOUS | Status: DC
  Administered 2017-05-25 – 2017-05-26 (×2): via INTRAVENOUS
  Administered 2017-05-27: 1000 mL via INTRAVENOUS

## 2017-05-25 MED ORDER — NICOTINE 14 MG/24HR TD PT24
14.0000 mg | MEDICATED_PATCH | Freq: Every day | TRANSDERMAL | Status: DC
  Administered 2017-05-26: 14 mg via TRANSDERMAL
  Filled 2017-05-25 (×2): qty 1

## 2017-05-25 MED ORDER — PREDNISONE 20 MG PO TABS
20.0000 mg | ORAL_TABLET | Freq: Every day | ORAL | Status: DC
  Administered 2017-05-26 – 2017-05-27 (×2): 20 mg via ORAL
  Filled 2017-05-25 (×2): qty 1

## 2017-05-25 MED ORDER — HYDROXYZINE HCL 10 MG PO TABS
10.0000 mg | ORAL_TABLET | Freq: Three times a day (TID) | ORAL | Status: DC | PRN
Start: 2017-05-25 — End: 2017-05-27

## 2017-05-25 MED ORDER — IPRATROPIUM BROMIDE 0.02 % IN SOLN
0.5000 mg | Freq: Once | RESPIRATORY_TRACT | Status: AC
Start: 2017-05-25 — End: 2017-05-25
  Administered 2017-05-25: 0.5 mg via RESPIRATORY_TRACT
  Filled 2017-05-25: qty 2.5

## 2017-05-25 MED ORDER — ESCITALOPRAM OXALATE 10 MG PO TABS: 40.0000 mg | ORAL_TABLET | Freq: Every day | ORAL | Status: DC

## 2017-05-25 MED ORDER — DOXYCYCLINE HYCLATE 100 MG PO TABS
100.0000 mg | ORAL_TABLET | Freq: Two times a day (BID) | ORAL | Status: DC
  Administered 2017-05-25 – 2017-05-27 (×4): 100 mg via ORAL
  Filled 2017-05-25 (×4): qty 1

## 2017-05-25 MED ORDER — RAMIPRIL 2.5 MG PO CAPS
2.5000 mg | ORAL_CAPSULE | Freq: Every day | ORAL | Status: DC
  Administered 2017-05-26 – 2017-05-27 (×2): 2.5 mg via ORAL
  Filled 2017-05-25 (×2): qty 1

## 2017-05-25 MED ORDER — ONDANSETRON HCL 4 MG PO TABS: 4.0000 mg | ORAL_TABLET | Freq: Four times a day (QID) | ORAL | Status: DC | PRN

## 2017-05-25 MED ORDER — DOCUSATE SODIUM 100 MG PO CAPS
100.0000 mg | ORAL_CAPSULE | Freq: Two times a day (BID) | ORAL | Status: DC
  Administered 2017-05-25 – 2017-05-27 (×4): 100 mg via ORAL
  Filled 2017-05-25 (×4): qty 1

## 2017-05-25 MED ORDER — ENOXAPARIN SODIUM 40 MG/0.4ML ~~LOC~~ SOLN
40.0000 mg | SUBCUTANEOUS | Status: DC
  Administered 2017-05-26 – 2017-05-27 (×2): 40 mg via SUBCUTANEOUS
  Filled 2017-05-25 (×2): qty 0.4

## 2017-05-25 MED ORDER — ACETAMINOPHEN 650 MG RE SUPP: 650.0000 mg | Freq: Four times a day (QID) | RECTAL | Status: DC | PRN

## 2017-05-25 MED ORDER — FLUTICASONE PROPIONATE 50 MCG/ACT NA SUSP
2.0000 | Freq: Every day | NASAL | Status: DC
  Administered 2017-05-26 – 2017-05-27 (×2): 2 via NASAL
  Filled 2017-05-25: qty 16

## 2017-05-25 MED ORDER — ALBUTEROL SULFATE (2.5 MG/3ML) 0.083% IN NEBU: 2.5000 mg | INHALATION_SOLUTION | RESPIRATORY_TRACT | Status: DC | PRN

## 2017-05-25 MED ORDER — ACETAMINOPHEN 325 MG PO TABS: 650.0000 mg | ORAL_TABLET | Freq: Four times a day (QID) | ORAL | Status: DC | PRN

## 2017-05-25 MED ORDER — ALBUTEROL SULFATE (2.5 MG/3ML) 0.083% IN NEBU
5.0000 mg | INHALATION_SOLUTION | Freq: Once | RESPIRATORY_TRACT | Status: AC
Start: 2017-05-25 — End: 2017-05-25
  Administered 2017-05-25: 5 mg via RESPIRATORY_TRACT
  Filled 2017-05-25: qty 6

## 2017-05-25 MED ORDER — PANTOPRAZOLE SODIUM 40 MG PO TBEC
40.0000 mg | DELAYED_RELEASE_TABLET | Freq: Every day | ORAL | Status: DC
  Administered 2017-05-26 – 2017-05-27 (×2): 40 mg via ORAL
  Filled 2017-05-25 (×2): qty 1

## 2017-05-25 MED ORDER — GUAIFENESIN ER 600 MG PO TB12
600.0000 mg | ORAL_TABLET | Freq: Two times a day (BID) | ORAL | Status: DC
  Administered 2017-05-25 – 2017-05-27 (×4): 600 mg via ORAL
  Filled 2017-05-25 (×4): qty 1

## 2017-05-25 MED ORDER — SODIUM CHLORIDE 0.9 % IV BOLUS
1000.0000 mL | Freq: Once | INTRAVENOUS | Status: AC
  Administered 2017-05-25: 1000 mL via INTRAVENOUS

## 2017-05-25 MED ORDER — IPRATROPIUM-ALBUTEROL 0.5-2.5 (3) MG/3ML IN SOLN
3.0000 mL | Freq: Four times a day (QID) | RESPIRATORY_TRACT | Status: DC
  Administered 2017-05-25 – 2017-05-26 (×2): 3 mL via RESPIRATORY_TRACT
  Filled 2017-05-25 (×2): qty 3

## 2017-05-25 MED ORDER — ONDANSETRON HCL 4 MG/2ML IJ SOLN: 4.0000 mg | Freq: Four times a day (QID) | INTRAMUSCULAR | Status: DC | PRN

## 2017-05-25 NOTE — ED Notes (Signed)
Pt's O2 dropped to 82 while performing orthostatic VS. Pt stated she did not feel low on O2 when asked, but was visibly breathing hard. Pt was put on 2L O2 because her oxygen did not go up past 86%.

## 2017-05-25 NOTE — ED Notes (Signed)
Patient transported to X-ray 

## 2017-05-25 NOTE — H&P (Addendum)
History and Physical    Alexandra Richardson ZOX:[id number] DOB: 1950/09/28 DOA: 05/25/2017  PCP:   [facility] Consultants:  None Patient coming from:  Home - lives alone; NOK: sister - "I don't know her number, I don't even know her name right now."  Chief Complaint: AMS  HPI: Alexandra Richardson is a 67 y.o. female with medical history significant of HLD, HTN, and anxietywho presents via EMS from her PCP for AMS.  Her PCP is concerned that this is symptomatic hyponatremia.  The patient states that she went to the doctor because "I thought I was gonna get my Xanax back."  By her history, +confused - today only.  "For the past three times."  +sob X 2-3 days.  +cough, productive of scant amount of yellow looking sputum.  She was admitted to [facility] from 3/22-25 with syncope thought to be related to orthostatic hypotension and dehydration.  She had a negative head CT; normal EKG/tele; unremarkable echo with grade 1 DD.  Na++ 124 on presentation, increased to 130.  Lasix was changed to prn on discharge.  She was recommended to go to SNF but declined and was to go home with her sister, but she has instead been living alone.    She returned to the ER on 4/15 after a fall.  She was rehydrated and discharged home.  She returned on 4/16 after falling twice in the parking lot after ER discharge.  Na++ 130, up from 125.  She was again discharged from the ER.     ED Course:  Discharged on 3/26, confused, ataxic, Na+ 123.  Dehydrated.  Xanax stopped 6-8 weeks ago.  Given 1L IVF.  Looks similar to last admission.  She did not actually live with her sister after last d/c, was living alone and not sure how she got to PCP today.  Review of Systems: As per HPI; otherwise review of systems reviewed and negative. This is likely inaccurate since the patient has cognitive impairment.  Ambulatory Status:  Ambulates without assistance  Past Medical History:  Diagnosis Date  . Anxiety   . Anxiety   . Arthritis   . Depression    . Grief 07/03/2011  . Hyperlipidemia   . Hypertension     Past Surgical History:  Procedure Laterality Date  . CHOLECYSTECTOMY      Social History   Socioeconomic History  . Marital status: Legally Separated    Spouse name: Not on file  . Number of children: Not on file  . Years of education: Not on file  . Highest education level: Not on file  Occupational History  . Occupation: retired  Engineer, production  . Financial resource strain: Not on file  . Food insecurity:    Worry: Not on file    Inability: Not on file  . Transportation needs:    Medical: Not on file    Non-medical: Not on file  Tobacco Use  . Smoking status: Current Every Day Smoker    Packs/day: 1.00    Years: 40.00    Pack years: 40.00    Types: Cigarettes  . Smokeless tobacco: Never Used  Substance and Sexual Activity  . Alcohol use: Yes    Comment: occasionally  . Drug use: No  . Sexual activity: Not on file  Lifestyle  . Physical activity:    Days per week: Not on file    Minutes per session: Not on file  . Stress: Not on file  Relationships  . Social connections:  Talks on phone: Not on file    Gets together: Not on file    Attends religious service: Not on file    Active member of club or organization: Not on file    Attends meetings of clubs or organizations: Not on file    Relationship status: Not on file  . Intimate partner violence:    Fear of current or ex partner: Not on file    Emotionally abused: Not on file    Physically abused: Not on file    Forced sexual activity: Not on file  Other Topics Concern  . Not on file  Social History Narrative  . Not on file    Allergies  Allergen Reactions  . Bee Venom Other (See Comments)  . Codeine Nausea And Vomiting    Family History  Problem Relation Age of Onset  . Coronary artery disease Father   . Diabetes type II Sister   . Hypertension Mother     Prior to Admission medications   Medication Sig Start Date End Date Taking?  Authorizing Provider  albuterol (PROVENTIL HFA;VENTOLIN HFA) 108 (90 BASE) MCG/ACT inhaler Inhale 2 puffs into the lungs every 6 (six) hours as needed for shortness of breath. For shortness of breath. 02/12/13   Jeanella Craze, NP  albuterol (PROVENTIL) (2.5 MG/3ML) 0.083% nebulizer solution Take 3 mLs (2.5 mg total) by nebulization every 4 (four) hours as needed for wheezing or shortness of breath. Use neb or proventil but not both at the same time 02/12/13   Jeanella Craze, NP  atorvastatin (LIPITOR) 80 MG tablet Take 80 mg by mouth daily.     [provider]  busPIRone (BUSPAR) 5 MG tablet Take 5 mg by mouth 3 (three) times daily. 05/04/17   [provider]  escitalopram (LEXAPRO) 20 MG tablet Take 40 mg by mouth daily.    [provider]  fluticasone (FLONASE) 50 MCG/ACT nasal spray Place 2 sprays into both nostrils daily. 05/02/17   Albertine Grates, MD  furosemide (LASIX) 40 MG tablet Take 1 tablet (40 mg total) by mouth daily as needed for fluid or edema. 05/01/17   Albertine Grates, MD  gabapentin (NEURONTIN) 600 MG tablet Take 1 tablet (600 mg total) by mouth at bedtime. 05/01/17   Albertine Grates, MD  guaiFENesin (MUCINEX) 600 MG 12 hr tablet Take 1 tablet (600 mg total) by mouth 2 (two) times daily. 05/01/17   Albertine Grates, MD  hydrOXYzine (ATARAX/VISTARIL) 10 MG tablet Take 1 tablet (10 mg total) by mouth 3 (three) times daily as needed for anxiety. 05/01/17   Albertine Grates, MD  magnesium oxide (MAG-OX) 400 MG tablet Take 400 mg by mouth 3 (three) times daily.    [provider]  metFORMIN (GLUCOPHAGE) 1000 MG tablet Take 1,000 mg by mouth 2 (two) times daily with a meal.    [provider]  omeprazole (PRILOSEC) 20 MG capsule Take 20 mg by mouth daily.    [provider]  ramipril (ALTACE) 2.5 MG capsule Take 2.5 mg by mouth daily.    [provider]  tiotropium (SPIRIVA) 18 MCG inhalation capsule Place 1 capsule (18 mcg total) into inhaler and inhale daily. 02/12/13    Jeanella Craze, NP    Physical Exam: Vitals:   05/25/17 1945 05/25/17 2000 05/25/17 2130 05/25/17 2145  BP: 131/72 128/74 117/72 128/75  Pulse: 94 95 96 97  Resp: (!) 38 (!) 25 (!) 30 (!) 25  Temp:  TempSrc:      SpO2: 95% 95% 93% 97%     General: Appears calm and comfortable and is NAD; she is mildly confused.  She appears older than stated age. Eyes:  PERRL, EOMI, normal lids, iris ENT:  grossly normal hearing, lips & tongue, mmm; edentulous on top, dentures on bottom Neck:  no LAD, masses or thyromegaly; no carotid bruits Cardiovascular:  RRR, no m/r/g. No LE edema.  Respiratory:   CTA bilaterally with no wheezes/rales/rhonchi.  Increased respiratory effort - actively doing breathing treatment. Abdomen:  soft, NT, ND, NABS Back:   normal alignment, no CVAT Skin:  no rash or induration seen on limited exam Musculoskeletal:  grossly normal tone BUE/BLE, good ROM, no bony abnormality Lower extremity:  No LE edema.  Limited foot exam with no ulcerations.  2+ distal pulses. Psychiatric:  Somewhat eccentric mood and affect, speech fluent and appropriate, AOx1 Neurologic:  CN 2-12 grossly intact, moves all extremities in coordinated fashion, sensation intact    Radiological Exams on Admission: Dg Chest 2 View  Result Date: 05/25/2017 CLINICAL DATA:  Pt c/o chronic cough and SOB for an unknown period of time. Hx of HTN. Pt is a former smoker. EXAM: CHEST - 2 VIEW COMPARISON:  Chest radiograph 05/22/2017 FINDINGS: Normal mediastinum and cardiac silhouette. Normal pulmonary vasculature. No evidence of effusion, infiltrate, or pneumothorax. No acute bony abnormality. IMPRESSION: No acute cardiopulmonary process. Electronically Signed   By: Genevive Bi M.D.   On: 05/25/2017 20:20    EKG: pending   Labs on Admission: I have personally reviewed the available labs and imaging studies at the time of the admission.  Pertinent labs:   Na++ 123; prior was 130 on 4/16 -  previously normal in 2015 Normal/stable renal function WBC 13.9   Assessment/Plan Principal Problem:   Altered mental status Active Problems:   Hypertension   Hyperlipidemia   Anxiety   Dehydration with hyponatremia   Dyspnea   Tobacco dependence   Altered mental status -Patient with recurrent episodes of falls and admissions/visits with concern about mental status -It is unclear what her baseline is and no family is present -While this may be related to hyponatremia, consideration of other causes is also reasonable -She had a negative CT head during her last hospitalization -If her MS does not clear with correction of hyponatremia, would consider MRI brain  Dehydration with hyponatremia -Etiology appears to be hypovolemic hyponatremia; patient with reported decreased PO intake. -There is concern about her ability to live at home alone safely and SW/APS consult may be needed. -SIADH is also a consideration given her extensive tobacco history.  CT chest was done on 4/16 and did not show evidence of lung malignancy and so SIADH is less of a consideration. -Overcorrection isn't really an issue since the patient's initial Na++ was >130; will rehydrate with NS at 100 cc/hr and recheck BMP in AM.  -Will recheck urinary and serum Osm as well as urinary sodium levels. -Will hold Lexapro, as this may be causing/contributing to symptoms. -Will hold Lasix.as well.  Dyspnea -May be related to anxiety -She has a long-standing smoking history and is continuing to smoke, so COPD is a probable cause/contributor. -Duonebs and prn Albuterol -Given dyspnea, change in sputum volume, and change in sputum consistency, will also treat as COPD exacerbation with PO doxy and prednisone.  HTN -Continue Altace.  HLD -Continue Lipitor.  Anxiety -Continue hydroxyzine and Buspar. -She was previously discontinued from Xanax and it does not appear to  be advisable to resume this now. -Her Lexapro has  been held and so symptoms may be difficult to control - would likely consider atypical antipsychotic next since SSRI causes hyponatremia and SNRI increases risk for seizure (not ideal given hyponatremia).  Tobacco dependence -Encourage cessation.  This was discussed with the patient and should be reviewed on an ongoing basis.   -Patch ordered at patient request.  DVT prophylaxis: Lovenox Code Status: DNR - confirmed with patient Family Communication: None present Disposition Plan:  To be determined Consults called: None Admission status: Admit - It is my clinical opinion that admission to INPATIENT is reasonable and necessary because of the expectation that this patient will require hospital care that crosses at least 2 midnights to treat this condition based on the medical complexity of the problems presented.  Given the aforementioned information, the predictability of an adverse outcome is felt to be significant.    Jonah BlueJennifer Rushi Chasen MD [facility]  If note is complete, please contact covering daytime or nighttime physician. [email or web address] Password [email or web address]  05/25/2017, 9:55 PM

## 2017-05-25 NOTE — ED Provider Notes (Signed)
[facility] EMERGENCY DEPARTMENT Provider Note   CSN: [id number] Arrival date & time: 05/25/17  1727     History   Chief Complaint Chief Complaint  Patient presents with  . Altered Mental Status    HPI Alexandra Richardson is a 67 y.o. female with a h/o of anxiety and depression who was sent to the emergency department by EMS from her PCP's office with a chief complaint of AMS.  PCP was concerned for hyponatremia due to ataxia, abnormal speech, and altered mental status. She cannot recall how she made it to her appointment with her PCP this afternoon. Patient states that she is concerned for Xanax withdrawal.  Paperwork with the patient from her PCPs office reveals that she has not had Xanax in 6 to 8 weeks.  She also endorses lightheadedness that is worse with standing and recurrent falls.  However, she is unable to state when she last fell. On initial evaluation, patient is alert and oriented x3.  States that she feels thirsty.  She is unable to state when she last ate or drank.  She believes that it was sometime yesterday.  She was admitted for hyponatremia likely secondary to dehydration from 3/22 to 3/25.  SNF placement was recommended, but the patient declined as she was discharged home with her sister.  Patient states that she has not been staying with her sister that she has been staying alone.  Denies alcohol use.   Level 5 caveat secondary to altered mental status.  The history is provided by the patient. No language interpreter was used.    Past Medical History:  Diagnosis Date  . Anxiety   . Anxiety   . Arthritis   . Depression   . Grief 07/03/2011  . Hyperlipidemia   . Hypertension     Patient Active Problem List   Diagnosis Date Noted  . Dyspnea 05/25/2017  . Tobacco dependence 05/25/2017  . Near syncope 04/28/2017  . Dehydration with hyponatremia 04/28/2017  . Pneumonia 02/07/2013  . Hypokalemia 07/03/2011  . Grief 07/03/2011  .  Encephalopathy 07/02/2011  . Hyperlipidemia 07/02/2011  . Anxiety 07/02/2011  . Overdose 07/01/2011  . Altered mental status 07/01/2011  . Hypertension 07/01/2011  . Depression 07/01/2011  . UTI (lower urinary tract infection) 07/01/2011    Past Surgical History:  Procedure Laterality Date  . CHOLECYSTECTOMY       OB History   None      Home Medications    Prior to Admission medications   Medication Sig Start Date End Date Taking? Authorizing Provider  albuterol (PROVENTIL HFA;VENTOLIN HFA) 108 (90 BASE) MCG/ACT inhaler Inhale 2 puffs into the lungs every 6 (six) hours as needed for shortness of breath. For shortness of breath. 02/12/13   Jeanella Craze, NP  albuterol (PROVENTIL) (2.5 MG/3ML) 0.083% nebulizer solution Take 3 mLs (2.5 mg total) by nebulization every 4 (four) hours as needed for wheezing or shortness of breath. Use neb or proventil but not both at the same time 02/12/13   Jeanella Craze, NP  atorvastatin (LIPITOR) 80 MG tablet Take 80 mg by mouth daily.     [provider]  busPIRone (BUSPAR) 5 MG tablet Take 5 mg by mouth 3 (three) times daily. 05/04/17   [provider]  escitalopram (LEXAPRO) 20 MG tablet Take 40 mg by mouth daily.    [provider]  fluticasone (FLONASE) 50 MCG/ACT nasal spray Place 2 sprays into both nostrils daily. 05/02/17   Roda Shutters,  Parke Poisson, MD  furosemide (LASIX) 40 MG tablet Take 1 tablet (40 mg total) by mouth daily as needed for fluid or edema. 05/01/17   Albertine Grates, MD  gabapentin (NEURONTIN) 600 MG tablet Take 1 tablet (600 mg total) by mouth at bedtime. 05/01/17   Albertine Grates, MD  guaiFENesin (MUCINEX) 600 MG 12 hr tablet Take 1 tablet (600 mg total) by mouth 2 (two) times daily. 05/01/17   Albertine Grates, MD  hydrOXYzine (ATARAX/VISTARIL) 10 MG tablet Take 1 tablet (10 mg total) by mouth 3 (three) times daily as needed for anxiety. 05/01/17   Albertine Grates, MD  magnesium oxide (MAG-OX) 400 MG tablet Take 400 mg by mouth 3 (three) times  daily.    [provider]  metFORMIN (GLUCOPHAGE) 1000 MG tablet Take 1,000 mg by mouth 2 (two) times daily with a meal.    [provider]  omeprazole (PRILOSEC) 20 MG capsule Take 20 mg by mouth daily.    [provider]  ramipril (ALTACE) 2.5 MG capsule Take 2.5 mg by mouth daily.    [provider]  tiotropium (SPIRIVA) 18 MCG inhalation capsule Place 1 capsule (18 mcg total) into inhaler and inhale daily. 02/12/13   Jeanella Craze, NP    Family History Family History  Problem Relation Age of Onset  . Coronary artery disease Father   . Diabetes type II Sister   . Hypertension Mother     Social History Social History   Tobacco Use  . Smoking status: Current Every Day Smoker    Packs/day: 1.00    Years: 40.00    Pack years: 40.00    Types: Cigarettes  . Smokeless tobacco: Never Used  Substance Use Topics  . Alcohol use: Yes    Comment: occasionally  . Drug use: No     Allergies   Bee venom and Codeine   Review of Systems Review of Systems  Unable to perform ROS: Mental status change  Neurological: Positive for light-headedness.   Physical Exam Updated Vital Signs BP 130/68   Pulse (!) 101   Temp 98 F (36.7 C)   Resp (!) 31   SpO2 90%   Physical Exam  Constitutional: She is oriented to person, place, and time. No distress.  HENT:  Head: Normocephalic.  Tongue and mucous membranes appear dry.  Intermittent twitching of the face is noted.  Eyes: Conjunctivae are normal.  Neck: Neck supple.  Cardiovascular: Normal rate, regular rhythm and intact distal pulses. Exam reveals no gallop and no friction rub.  No murmur heard. Pulmonary/Chest: Effort normal. No stridor. No respiratory distress. She has wheezes. She has no rales.  Abdominal: Soft. Bowel sounds are normal. She exhibits no distension and no mass. There is no tenderness. There is no rebound and no guarding. No hernia.  Musculoskeletal: She exhibits no edema.  No  lower extremity edema bilaterally.  DP and Radial pulses are 2+ bilaterally.   Neurological: She is alert and oriented to person, place, and time.  Finger-to-nose is intact bilaterally.  Cranial nerves II through XII are grossly intact.  5 out of 5 strength against resistance of the bilateral upper and lower extremities.  Sensation is intact throughout.  GCS 15.  Speech is mostly coherent and fluent, but intermittently incomprehensible  Skin: Skin is warm. Capillary refill takes more than 3 seconds. No rash noted. She is not diaphoretic.  Psychiatric: Her behavior is normal.  Nursing note and vitals reviewed.  ED Treatments / Results  Labs (  all labs ordered are listed, but only abnormal results are displayed) Labs Reviewed  COMPREHENSIVE METABOLIC PANEL - Abnormal; Notable for the following components:      Result Value   Sodium 123 (*)    Chloride 84 (*)    BUN 5 (*)    Calcium 8.5 (*)    Alkaline Phosphatase 27 (*)    Total Bilirubin 1.7 (*)    All other components within normal limits  CBC - Abnormal; Notable for the following components:   WBC 13.9 (*)    MCHC 36.1 (*)    All other components within normal limits  OSMOLALITY, URINE - Abnormal; Notable for the following components:   Osmolality, Ur 151 (*)    All other components within normal limits  OSMOLALITY - Abnormal; Notable for the following components:   Osmolality 258 (*)    All other components within normal limits  SODIUM, URINE, RANDOM  BASIC METABOLIC PANEL  CBC    EKG None  Radiology Dg Chest 2 View  Result Date: 05/25/2017 CLINICAL DATA:  Pt c/o chronic cough and SOB for an unknown period of time. Hx of HTN. Pt is a former smoker. EXAM: CHEST - 2 VIEW COMPARISON:  Chest radiograph 05/22/2017 FINDINGS: Normal mediastinum and cardiac silhouette. Normal pulmonary vasculature. No evidence of effusion, infiltrate, or pneumothorax. No acute bony abnormality. IMPRESSION: No acute cardiopulmonary process.  Electronically Signed   By: Genevive BiStewart  Edmunds M.D.   On: 05/25/2017 20:20    Procedures .Critical Care Performed by: Barkley BoardsMcDonald, Mia A, PA-C Authorized by: Barkley BoardsMcDonald, Mia A, PA-C   Critical care provider statement:    Critical care time (minutes):  35   Critical care was necessary to treat or prevent imminent or life-threatening deterioration of the following conditions:  Endocrine crisis   Critical care was time spent personally by me on the following activities:  Ordering and review of radiographic studies, ordering and review of laboratory studies, ordering and performing treatments and interventions, examination of patient, evaluation of patient's response to treatment, development of treatment plan with patient or surrogate, review of old charts and obtaining history from patient or surrogate   (including critical care time)  Medications Ordered in ED Medications  albuterol (PROVENTIL) (2.5 MG/3ML) 0.083% nebulizer solution 2.5 mg (has no administration in time range)  ipratropium-albuterol (DUONEB) 0.5-2.5 (3) MG/3ML nebulizer solution 3 mL (3 mLs Nebulization Given 05/25/17 2147)  atorvastatin (LIPITOR) tablet 80 mg (has no administration in time range)  busPIRone (BUSPAR) tablet 5 mg (5 mg Oral Given 05/25/17 2142)  fluticasone (FLONASE) 50 MCG/ACT nasal spray 2 spray (has no administration in time range)  guaiFENesin (MUCINEX) 12 hr tablet 600 mg (600 mg Oral Given 05/25/17 2142)  hydrOXYzine (ATARAX/VISTARIL) tablet 10 mg (has no administration in time range)  pantoprazole (PROTONIX) EC tablet 40 mg (has no administration in time range)  ramipril (ALTACE) capsule 2.5 mg (has no administration in time range)  enoxaparin (LOVENOX) injection 40 mg (has no administration in time range)  acetaminophen (TYLENOL) tablet 650 mg (has no administration in time range)    Or  acetaminophen (TYLENOL) suppository 650 mg (has no administration in time range)  docusate sodium (COLACE) capsule 100 mg  (100 mg Oral Given 05/25/17 2142)  ondansetron (ZOFRAN) tablet 4 mg (has no administration in time range)    Or  ondansetron (ZOFRAN) injection 4 mg (has no administration in time range)  nicotine (NICODERM CQ - dosed in mg/24 hours) patch 14 mg (has no administration in time  range)  0.9 %  sodium chloride infusion ( Intravenous New Bag/Given 05/25/17 2141)  doxycycline (VIBRA-TABS) tablet 100 mg (100 mg Oral Given 05/25/17 2250)  predniSONE (DELTASONE) tablet 20 mg (has no administration in time range)  sodium chloride 0.9 % bolus 1,000 mL (0 mLs Intravenous Stopped 05/25/17 2131)  albuterol (PROVENTIL) (2.5 MG/3ML) 0.083% nebulizer solution 5 mg (5 mg Nebulization Given 05/25/17 2021)  ipratropium (ATROVENT) nebulizer solution 0.5 mg (0.5 mg Nebulization Given 05/25/17 2021)     Initial Impression / Assessment and Plan / ED Course  I have reviewed the triage vital signs and the nursing notes.  Pertinent labs & imaging results that were available during my care of the patient were reviewed by me and considered in my medical decision making (see chart for details).     67 year old female with a history of anxiety and depression presenting to the ED by EMS from her PCPs office due to concerns for ataxia, altered mental status, and abnormal speech.  She also endorses lightheadedness and recurrent falls, but is unable to elaborate on either.  Initially tachycardic on arrival which improved without treatment.   Hyponatremic at 123.  On physical exam, she appears clinically dehydrated.  She also has scattered wheezes bilaterally.  She is unable to state when she last ate or drink.  She preoccupied that she is concerned that she has withdrawing from Xanax given the she states that she is unable to recall the last time she has taken the medication.  I note from her PCP indicates that she has not had the medication in 6 to 8 weeks.  Denies a history of seizures.  Medical record reviewed.  Admitted with  similar symptoms from 3/22-3/25 and re-evaluated in the ED on 4/15 and 4/16.  Hyponatremia today is likely secondary to volume depletion.  She continues to be intermittently confused about recent events, but is A&O x3.  Doubt CVA, encephalitis, drug intoxication, or benzodiazepine withdrawal.   IV fluids given in the ED.  Albuterol nebulizer treatment administered.  Chest x-ray is unremarkable.  The patient was discussed with Dr. Ranae Palms, attending physician.  Spoke with Dr. Ophelia Charter, hospitalist who will accept the admission. The patient appears reasonably stabilized for admission considering the current resources, flow, and capabilities available in the ED at this time, and I doubt any other [facility] requiring further screening and/or treatment in the ED prior to admission.  Final Clinical Impressions(s) / ED Diagnoses   Final diagnoses:  Hyponatremia    ED Discharge Orders    None       Barkley Boards, PA-C 05/26/17 Evonnie Pat, MD 05/29/17 1506

## 2017-05-25 NOTE — ED Triage Notes (Signed)
Per [facility], Pt sent by PCP for concerns for hyponatremia due to ataxia, abnormal speech and AMS. Pt alert and oriented x 3, able to answer some questions, when asked what brings her in?, she states, "I thought I needed my medicine so I went to get it."I tried to ask pt other questions, she states, "I can't help you." Speech is occasionally garbled. Pt going through withdrawal from xanax. Pt tachypnea and tachycardic

## 2017-05-25 NOTE — ED Notes (Signed)
Osmolality, urine collected. Urine culture also collected and sent down to main lab.

## 2017-05-25 NOTE — ED Notes (Signed)
Pt coming from triage

## 2017-05-25 NOTE — ED Provider Notes (Signed)
Called by PCP. Sending pt back to ED. Still falling and she is concerned about pt's hyponatremia. She has been seen in the ED multiple times recently and also admission in the past month for similar.   Most recent DC summary mentions a low random cortisol level previously. Not clear if this has been followed-up. Brief chart review was done after speaking with PCP.        Raeford RazorKohut, Wadie Liew, MD 05/25/17 [phone]

## 2017-05-26 DIAGNOSIS — E871 Hypo-osmolality and hyponatremia: Principal | ICD-10-CM

## 2017-05-26 DIAGNOSIS — F17200 Nicotine dependence, unspecified, uncomplicated: Secondary | ICD-10-CM

## 2017-05-26 DIAGNOSIS — I10 Essential (primary) hypertension: Secondary | ICD-10-CM

## 2017-05-26 DIAGNOSIS — E785 Hyperlipidemia, unspecified: Secondary | ICD-10-CM

## 2017-05-26 DIAGNOSIS — E118 Type 2 diabetes mellitus with unspecified complications: Secondary | ICD-10-CM

## 2017-05-26 DIAGNOSIS — R410 Disorientation, unspecified: Secondary | ICD-10-CM

## 2017-05-26 DIAGNOSIS — F419 Anxiety disorder, unspecified: Secondary | ICD-10-CM

## 2017-05-26 DIAGNOSIS — R0602 Shortness of breath: Secondary | ICD-10-CM

## 2017-05-26 LAB — CBC
HCT: 36.9 % (ref 36.0–46.0)
Hemoglobin: 12.9 g/dL (ref 12.0–15.0)
MCH: 29.5 pg (ref 26.0–34.0)
MCHC: 35 g/dL (ref 30.0–36.0)
MCV: 84.4 fL (ref 78.0–100.0)
Platelets: 207 10*3/uL (ref 150–400)
RBC: 4.37 MIL/uL (ref 3.87–5.11)
RDW: 14.8 % (ref 11.5–15.5)
WBC: 10.1 10*3/uL (ref 4.0–10.5)

## 2017-05-26 LAB — BASIC METABOLIC PANEL
Anion gap: 13 (ref 5–15)
BUN: <5 mg/dL — ABNORMAL LOW (ref 6–20)
CO2: 23 mmol/L (ref 22–32)
Calcium: 8.6 mg/dL — ABNORMAL LOW (ref 8.9–10.3)
Chloride: 93 mmol/L — ABNORMAL LOW (ref 101–111)
Creatinine, Ser: 0.74 mg/dL (ref 0.44–1.00)
GFR calc Af Amer: >60 mL/min (ref >60)
GFR calc non Af Amer: >60 mL/min (ref >60)
Glucose, Bld: 125 mg/dL — ABNORMAL HIGH (ref 65–99)
Potassium: 3.7 mmol/L (ref 3.5–5.1)
Sodium: 129 mmol/L — ABNORMAL LOW (ref 135–145)

## 2017-05-26 NOTE — Progress Notes (Signed)
PROGRESS NOTE    Alexandra Richardson  WUJ:[id number] DOB: 23-Apr-1950 DOA: 05/25/2017 PCP: Ailene Ravel, MD   Brief Narrative:  HPI on 05/25/2017 by Dr. Jonah Blue Alexandra Richardson is a 67 y.o. female with medical history significant of HLD, HTN, and anxietywho presents via EMS from her PCP for AMS.  Her PCP is concerned that this is symptomatic hyponatremia.  The patient states that she went to the doctor because "I thought I was gonna get my Xanax back."  By her history, +confused - today only.  "For the past three times."  +sob X 2-3 days.  +cough, productive of scant amount of yellow looking sputum.  She was admitted to [facility] from 3/22-25 with syncope thought to be related to orthostatic hypotension and dehydration.  She had a negative head CT; normal EKG/tele; unremarkable echo with grade 1 DD.  Na++ 124 on presentation, increased to 130.  Lasix was changed to prn on discharge.  She was recommended to go to SNF but declined and was to go home with her sister, but she has instead been living alone.    She returned to the ER on 4/15 after a fall.  She was rehydrated and discharged home.  She returned on 4/16 after falling twice in the parking lot after ER discharge.  Na++ 130, up from 125.  She was again discharged from the ER.    Assessment & Plan   Hyponatremia with dehydration -Likely hypovolemic hyponatremia as patient reported decreased oral intake -Recently admitted in March for similar presentation -Sodium on admission 123, currently 129 -Patient placed on IV fluids -Lexapro as well as Lasix held -Continue to monitor BMP -Of note, patient had CTA chest on 05/23/2017 which was negative for pulmonary embolism, no mention of malignancy- SIADH unlikely  Acute encephalopathy, metabolic -Suspect secondary to the above -Patient lives alone and has had frequent ED visits and admissions for recurrent falls -Patient currently alert and oriented x3  Dyspnea/Possible COPD  exacerbation -Possibly related to anxiety as well as possible underlying COPD -Patient has a long history of smoking -Continue albuterol as well as Doxy, prednisone for possible COPD exacerbation  Essential hypertension -Continue ramipril  Hyperlipidemia -Continue statin  Anxiety -Continue BuSpar, hydroxyzine -Xanax was discontinued previously -Lexapro was held due to hyponatremia  Tobacco abuse -Smoking cessation discussed, continue nicotine patch  Frequent falls -Likely secondary to the above -PT/OT consulted and appreciated -Last admission, SNF placement was recommended however patient declined and was discharged home with home health -patient lives alone  Diabetes mellitus, type II -Metformin held -Will place patient on insulin sliding scale with CBG monitoring  DVT Prophylaxis Lovenox  Code Status: DNR  Family Communication: None at bedside  Disposition Plan: Admitted, pending improvement in sodium and mental status  Consultants None  Procedures  None  Antibiotics   Anti-infectives (From admission, onward)   Start     Dose/Rate Route Frequency Ordered Stop   05/25/17 2200  doxycycline (VIBRA-TABS) tablet 100 mg     100 mg Oral Every 12 hours 05/25/17 2154        Subjective:   Kieley Akter seen and examined today.  Patient inquires about going home.  Denies current chest pain, shortness breath, abdominal pain, nausea or vomiting, diarrhea constipation, dizziness or headache.  Does state that she has been falling more frequently.  Objective:   Vitals:   05/26/17 0151 05/26/17 0418 05/26/17 0850 05/26/17 0855  BP: 128/74 131/72 126/73   Pulse: 95 92 (!) 105  Resp: 20 20 20    Temp: 98.8 F (37.1 C) 98.9 F (37.2 C) 98.4 F (36.9 C)   TempSrc: Oral Oral Oral   SpO2: 100% 94% 96% 96%  Weight: 76 kg (167 lb 8.8 oz)     Height: 5\' 3"  (1.6 m)       Intake/Output Summary (Last 24 hours) at 05/26/2017 1325 Last data filed at 05/26/2017 0304 Gross  per 24 hour  Intake 1665 ml  Output -  Net 1665 ml   Filed Weights   05/26/17 0151  Weight: 76 kg (167 lb 8.8 oz)    Exam  General: Well developed, well nourished, NAD, appears stated age  HEENT: NCAT, mucous membranes moist.   Neck: Supple  Cardiovascular: S1 S2 auscultated, no rubs, murmurs or gallops. Regular rate and rhythm.  Respiratory: Clear to auscultation bilaterally with equal chest rise  Abdomen: Soft, nontender, nondistended, + bowel sounds  Extremities: warm dry without cyanosis clubbing or edema  Neuro: AAOx3, nonfocal  Psych: Anxious   Data Reviewed: I have personally reviewed following labs and imaging studies  CBC: Recent Labs  Lab 05/22/17 2245 05/23/17 1711 05/25/17 1758 05/26/17 0717  WBC 14.0* 11.5* 13.9* 10.1  NEUTROABS 9.4* 7.4  --   --   HGB 13.6 13.1 13.3 12.9  HCT 38.2 37.6 36.8 36.9  MCV 83.6 85.5 83.4 84.4  PLT 192 183 226 207   Basic Metabolic Panel: Recent Labs  Lab 05/22/17 2245 05/23/17 1711 05/25/17 1758 05/26/17 0717  NA 125* 130* 123* 129*  K 3.7 3.6 3.7 3.7  CL 84* 91* 84* 93*  CO2 25 25 25 23   GLUCOSE 115* 119* 92 125*  BUN 7 7 5* <5*  CREATININE 0.84 0.74 0.87 0.74  CALCIUM 8.3* 8.2* 8.5* 8.6*   GFR: Estimated Creatinine Clearance: 65.7 mL/min (by C-G formula based on SCr of 0.74 mg/dL). Liver Function Tests: Recent Labs  Lab 05/22/17 2245 05/23/17 1711 05/25/17 1758  AST 23 20 24   ALT 29 26 22   ALKPHOS 36* 30* 27*  BILITOT 0.7 0.8 1.7*  PROT 6.6 6.4* 6.9  ALBUMIN 3.6 3.4* 3.7   No results for input(s): LIPASE, AMYLASE in the last 168 hours. Recent Labs  Lab 05/23/17 0120  AMMONIA 28   Coagulation Profile: No results for input(s): INR, PROTIME in the last 168 hours. Cardiac Enzymes: No results for input(s): CKTOTAL, CKMB, CKMBINDEX, TROPONINI in the last 168 hours. BNP (last 3 results) No results for input(s): PROBNP in the last 8760 hours. HbA1C: No results for input(s): HGBA1C in the  last 72 hours. CBG: Recent Labs  Lab 05/22/17 2307  GLUCAP 126*   Lipid Profile: No results for input(s): CHOL, HDL, LDLCALC, TRIG, CHOLHDL, LDLDIRECT in the last 72 hours. Thyroid Function Tests: No results for input(s): TSH, T4TOTAL, FREET4, T3FREE, THYROIDAB in the last 72 hours. Anemia Panel: No results for input(s): VITAMINB12, FOLATE, FERRITIN, TIBC, IRON, RETICCTPCT in the last 72 hours. Urine analysis:    Component Value Date/Time   COLORURINE STRAW (A) 05/22/2017 2329   APPEARANCEUR CLEAR 05/22/2017 2329   LABSPEC 1.003 (L) 05/22/2017 2329   PHURINE 6.0 05/22/2017 2329   GLUCOSEU NEGATIVE 05/22/2017 2329   HGBUR NEGATIVE 05/22/2017 2329   BILIRUBINUR NEGATIVE 05/22/2017 2329   KETONESUR NEGATIVE 05/22/2017 2329   PROTEINUR NEGATIVE 05/22/2017 2329   UROBILINOGEN 2.0 (H) 02/07/2013 1827   NITRITE NEGATIVE 05/22/2017 2329   LEUKOCYTESUR NEGATIVE 05/22/2017 2329   Sepsis Labs: @LABRCNTIP (procalcitonin:4,lacticidven:4)  )No results found for this  or any previous visit (from the past 240 hour(s)).    Radiology Studies: Dg Chest 2 View  Result Date: 05/25/2017 CLINICAL DATA:  Pt c/o chronic cough and SOB for an unknown period of time. Hx of HTN. Pt is a former smoker. EXAM: CHEST - 2 VIEW COMPARISON:  Chest radiograph 05/22/2017 FINDINGS: Normal mediastinum and cardiac silhouette. Normal pulmonary vasculature. No evidence of effusion, infiltrate, or pneumothorax. No acute bony abnormality. IMPRESSION: No acute cardiopulmonary process. Electronically Signed   By: Genevive Bi M.D.   On: 05/25/2017 20:20     Scheduled Meds: . atorvastatin  80 mg Oral Daily  . busPIRone  5 mg Oral TID  . docusate sodium  100 mg Oral BID  . doxycycline  100 mg Oral Q12H  . enoxaparin (LOVENOX) injection  40 mg Subcutaneous Q24H  . fluticasone  2 spray Each Nare Daily  . guaiFENesin  600 mg Oral BID  . ipratropium-albuterol  3 mL Nebulization Q6H  . nicotine  14 mg Transdermal  Daily  . pantoprazole  40 mg Oral Daily  . predniSONE  20 mg Oral Q breakfast  . ramipril  2.5 mg Oral Daily   Continuous Infusions: . sodium chloride 75 mL/hr at 05/25/17 2141     LOS: 1 day   Time Spent in minutes   45 minutes  Kymberlie Brazeau D.O. on 05/26/2017 at 1:25 PM  Between 7am to 7pm - Pager - [phone]  After 7pm go to [email or web address] - password [email or web address]  And look for the night coverage person covering for me after hours  [facility] Office  [phone]

## 2017-05-27 LAB — BASIC METABOLIC PANEL
Anion gap: 11 (ref 5–15)
BUN: <5 mg/dL — ABNORMAL LOW (ref 6–20)
CO2: 24 mmol/L (ref 22–32)
Calcium: 8.6 mg/dL — ABNORMAL LOW (ref 8.9–10.3)
Chloride: 97 mmol/L — ABNORMAL LOW (ref 101–111)
Creatinine, Ser: 0.67 mg/dL (ref 0.44–1.00)
GFR calc Af Amer: >60 mL/min (ref >60)
GFR calc non Af Amer: >60 mL/min (ref >60)
Glucose, Bld: 100 mg/dL — ABNORMAL HIGH (ref 65–99)
Potassium: 3.4 mmol/L — ABNORMAL LOW (ref 3.5–5.1)
Sodium: 132 mmol/L — ABNORMAL LOW (ref 135–145)

## 2017-05-27 MED ORDER — NICOTINE 14 MG/24HR TD PT24: 14.0000 mg | MEDICATED_PATCH | Freq: Every day | TRANSDERMAL | 0 refills | Status: DC

## 2017-05-27 MED ORDER — POTASSIUM CHLORIDE CRYS ER 20 MEQ PO TBCR
40.0000 meq | EXTENDED_RELEASE_TABLET | Freq: Once | ORAL | Status: AC
  Administered 2017-05-27: 40 meq via ORAL
  Filled 2017-05-27: qty 2

## 2017-05-27 MED ORDER — DOXYCYCLINE HYCLATE 100 MG PO TABS: 100.0000 mg | ORAL_TABLET | Freq: Two times a day (BID) | ORAL | 0 refills | Status: DC

## 2017-05-27 MED ORDER — PREDNISONE 5 MG PO TABS: ORAL_TABLET | ORAL | 0 refills | Status: DC

## 2017-05-27 NOTE — Progress Notes (Signed)
Patient ready for discharge to home; discharge instructions given and reviewed;Rx's sent electronically. Patient discharged to home accompanied by her sister.

## 2017-05-27 NOTE — Evaluation (Signed)
Physical Therapy Evaluation Patient Details Name: Alexandra Richardson MRN: [id number] DOB: 1950-06-03 Today's Date: 05/27/2017   History of Present Illness  Pt is a 67 y/o female admitted secondary to fall at home and found to be hyponatremic. PMH including but not limited to anxiety, depression, HLD and HTN.    Clinical Impression  Pt presented supine in bed with HOB elevated, awake and willing to participate in therapy session. Prior to admission, pt reported that she was independent with all functional mobility and ADLs. Pt lives alone but has a sister that can come to assist her intermittently. Pt currently able to perform bed mobility with modified independence, transfers with supervision and ambulated in hallway with min guard. Pt with two LOBs and able to self correct without use of an AD. Pt would continue to benefit from skilled physical therapy services at this time while admitted and after d/c to address the below listed limitations in order to improve overall safety and independence with functional mobility.     Follow Up Recommendations Outpatient PT(pt adamant about returning home)    Equipment Recommendations  None recommended by PT    Recommendations for Other Services       Precautions / Restrictions Precautions Precautions: Fall Restrictions Weight Bearing Restrictions: No      Mobility  Bed Mobility Overal bed mobility: Modified Independent                Transfers Overall transfer level: Needs assistance Equipment used: None Transfers: Sit to/from Stand Sit to Stand: Supervision         General transfer comment: supervision for safety  Ambulation/Gait Ambulation/Gait assistance: Min guard Ambulation Distance (Feet): 150 Feet Assistive device: None Gait Pattern/deviations: Step-through pattern;Decreased stride length;Drifts right/left Gait velocity: decreased Gait velocity interpretation: <1.31 ft/sec, indicative of household ambulator General  Gait Details: pt with moderate instability but able to self correct LOB x2, min guard for safety  Stairs Stairs: Yes Stairs assistance: Supervision Stair Management: One rail Left;Alternating pattern;Forwards      Wheelchair Mobility    Modified Rankin (Stroke Patients Only)       Balance Overall balance assessment: Needs assistance Sitting-balance support: Feet supported Sitting balance-Leahy Scale: Good     Standing balance support: During functional activity;No upper extremity supported Standing balance-Leahy Scale: Fair                   Standardized Balance Assessment Standardized Balance Assessment : Dynamic Gait Index   Dynamic Gait Index Level Surface: Normal Change in Gait Speed: Normal Gait with Horizontal Head Turns: Mild Impairment Gait with Vertical Head Turns: Normal Gait and Pivot Turn: Mild Impairment Step Over Obstacle: Normal Step Around Obstacles: Normal Steps: Mild Impairment Total Score: 21       Pertinent Vitals/Pain Pain Assessment: No/denies pain    Home Living Family/patient expects to be discharged to:: Private residence Living Arrangements: Alone Available Help at Discharge: Family;Available PRN/intermittently Type of Home: Mobile home Home Access: Stairs to enter Entrance Stairs-Rails: Right;Left Entrance Stairs-Number of Steps: 5 Home Layout: One level Home Equipment: Shower seat;Cane - single point;Walker - 2 wheels      Prior Function Level of Independence: Independent with assistive device(s)         Comments: SPC PRN      Hand Dominance        Extremity/Trunk Assessment   Upper Extremity Assessment Upper Extremity Assessment: Defer to OT evaluation    Lower Extremity Assessment Lower Extremity Assessment: Overall WFL for  tasks assessed    Cervical / Trunk Assessment Cervical / Trunk Assessment: Normal  Communication   Communication: No difficulties  Cognition Arousal/Alertness:  Awake/alert Behavior During Therapy: WFL for tasks assessed/performed Overall Cognitive Status: Impaired/Different from baseline Area of Impairment: Safety/judgement                         Safety/Judgement: Decreased awareness of safety            General Comments      Exercises     Assessment/Plan    PT Assessment Patient needs continued PT services  PT Problem List Decreased balance;Decreased mobility;Decreased coordination       PT Treatment Interventions DME instruction;Stair training;Gait training;Therapeutic activities;Functional mobility training;Therapeutic exercise;Balance training;Neuromuscular re-education;Patient/family education    PT Goals (Current goals can be found in the Care Plan section)  Acute Rehab PT Goals Patient Stated Goal: return home PT Goal Formulation: With patient Time For Goal Achievement: 06/10/17 Potential to Achieve Goals: Good    Frequency Min 3X/week   Barriers to discharge        Co-evaluation               AM-PAC PT "6 Clicks" Daily Activity  Outcome Measure Difficulty turning over in bed (including adjusting bedclothes, sheets and blankets)?: None Difficulty moving from lying on back to sitting on the side of the bed? : None Difficulty sitting down on and standing up from a chair with arms (e.g., wheelchair, bedside commode, etc,.)?: None Help needed moving to and from a bed to chair (including a wheelchair)?: None Help needed walking in hospital room?: A Little Help needed climbing 3-5 steps with a railing? : A Little 6 Click Score: 22    End of Session Equipment Utilized During Treatment: Gait belt Activity Tolerance: Patient tolerated treatment well Patient left: in bed;with call bell/phone within reach;Other (comment)(sitting EOB) Nurse Communication: Mobility status PT Visit Diagnosis: Other abnormalities of gait and mobility (R26.89)    Time: 0950-1008 PT Time Calculation (min) (ACUTE ONLY): 18  min   Charges:   PT Evaluation $PT Eval Low Complexity: 1 Low     PT G Codes:        [place], PT, [place] [phone]   Alexandra Richardson 05/27/2017, 1:46 PM

## 2017-05-27 NOTE — Progress Notes (Signed)
Occupational Therapy Note  OT eval completed - full note to follow.  Pt is able to perform ADLs mod I, and has assistance from sister for medication management, finances, as well as meal prep and grocery shopping.   She has all DME.   Recommend OPOT to improve independence with IADLs.   Alexandra Richardson, OTR/L [phone]

## 2017-05-27 NOTE — Discharge Instructions (Signed)
Hyponatremia Hyponatremia is when the amount of salt (sodium) in your blood is too low. When salt levels are low, your cells absorb extra water and they swell. The swelling happens throughout the body, but it mostly affects the brain. Follow these instructions at home:  Take medicines only as told by your doctor. Many medicines can make this condition worse. Talk with your doctor about any medicines that you are currently taking.  Carefully follow a recommended diet as told by your doctor.  Carefully follow instructions from your doctor about fluid restrictions.  Keep all follow-up visits as told by your doctor. This is important.  Do not drink alcohol. Contact a doctor if:  You feel sicker to your stomach (nauseous).  You feel more confused.  You feel more tired (fatigued).  Your headache gets worse.  You feel weaker.  Your symptoms go away and then they come back.  You have trouble following the diet instructions. Get help right away if:  You start to twitch and shake (have a seizure).  You pass out (faint).  You keep having watery poop (diarrhea).  You keep throwing up (vomiting). This information is not intended to replace advice given to you by your health care provider. Make sure you discuss any questions you have with your health care provider. Document Released: 10/06/2010 Document Revised: 07/02/2015 Document Reviewed: 01/20/2014 Elsevier Interactive Patient Education  2018 [employer]  

## 2017-05-27 NOTE — Evaluation (Signed)
Occupational Therapy Evaluation Patient Details Name: Alexandra Richardson MRN: [id number] DOB: February 12, 1950 Today's Date: 05/27/2017    History of Present Illness Pt is a 67 y/o female admitted secondary to fall at home and found to be hyponatremic. PMH including but not limited to anxiety, depression, HLD and HTN.   Clinical Impression   .Pt admitted with above, and demonstrates the below listed deficits.  She is able to perform ADLs mod I, but requires assist for IADLs such as meal prep, medication management, finances, etc.  She has a history of falling.  She lives alone, and reports her sister assists her as needed and will continue to do so at discharge.  She likely needs a higher level of care such as ALF at some point.  She may be a candidate for PACE program.  Recommend OPOT.     Follow Up Recommendations  Outpatient OT;Supervision - Intermittent    Equipment Recommendations  None recommended by OT    Recommendations for Other Services       Precautions / Restrictions Precautions Precautions: Fall Restrictions Weight Bearing Restrictions: No      Mobility Bed Mobility Overal bed mobility: Modified Independent                Transfers Overall transfer level: Modified independent Equipment used: None Transfers: Sit to/from Stand Sit to Stand: Supervision         General transfer comment: supervision for safety    Balance Overall balance assessment: Needs assistance Sitting-balance support: Feet supported Sitting balance-Leahy Scale: Good     Standing balance support: During functional activity;No upper extremity supported Standing balance-Leahy Scale: Fair Standing balance comment: requires UE support when simulating shower in standing                  Standardized Balance Assessment Standardized Balance Assessment : Dynamic Gait Index   Dynamic Gait Index Level Surface: Normal Change in Gait Speed: Normal Gait with Horizontal Head Turns:  Mild Impairment Gait with Vertical Head Turns: Normal Gait and Pivot Turn: Mild Impairment Step Over Obstacle: Normal Step Around Obstacles: Normal Steps: Mild Impairment Total Score: 21     ADL either performed or assessed with clinical judgement   ADL Overall ADL's : Modified independent                                       General ADL Comments: Pt performed tub transfer mod I, and simulated shower mod I in standing.  She reports she dressed herself today mod I      Vision Baseline Vision/History: No visual deficits Patient Visual Report: No change from baseline Vision Assessment?: No apparent visual deficits     Perception Perception Perception Tested?: Yes   Praxis      Pertinent Vitals/Pain Pain Assessment: No/denies pain     Hand Dominance Right   Extremity/Trunk Assessment Upper Extremity Assessment Upper Extremity Assessment: Generalized weakness   Lower Extremity Assessment Lower Extremity Assessment: Defer to PT evaluation   Cervical / Trunk Assessment Cervical / Trunk Assessment: Normal   Communication Communication Communication: No difficulties   Cognition Arousal/Alertness: Awake/alert Behavior During Therapy: WFL for tasks assessed/performed;Anxious Overall Cognitive Status: No family/caregiver present to determine baseline cognitive functioning Area of Impairment: Safety/judgement                         Safety/Judgement: Decreased awareness  of safety     General Comments: Pt with mildly delayed processing.  She demonstrates alternating attention with min cues.  She is able to perform serial 2's while ambulating with min cues    General Comments  Pt is very eager for discharge     Exercises     Shoulder Instructions      Home Living Family/patient expects to be discharged to:: Private residence Living Arrangements: Alone Available Help at Discharge: Family;Available PRN/intermittently Type of Home: Mobile  home Home Access: Stairs to enter Entrance Stairs-Number of Steps: 5 Entrance Stairs-Rails: Right;Left Home Layout: One level     Bathroom Shower/Tub: Tub/shower unit         Home Equipment: Shower seat;Cane - single point;Walker - 2 wheels   Additional Comments: per pt report, sister is available to assist her as needed       Prior Functioning/Environment Level of Independence: Needs assistance  Gait / Transfers Assistance Needed: mod I  ADL's / Homemaking Assistance Needed: Pt reports she performs ADLs mod I, sister assists with meal prep, or pt prepares frozen or canned foods.  Sister provides transportation, and assists with finances and medication management    Comments: SPC PRN         OT Problem List: Decreased activity tolerance;Decreased strength      OT Treatment/Interventions:      OT Goals(Current goals can be found in the care plan section) Acute Rehab OT Goals Patient Stated Goal: return home OT Goal Formulation: All assessment and education complete, DC therapy  OT Frequency:     Barriers to D/C:            Co-evaluation              AM-PAC PT "6 Clicks" Daily Activity     Outcome Measure Help from another person eating meals?: None Help from another person taking care of personal grooming?: None Help from another person toileting, which includes using toliet, bedpan, or urinal?: None Help from another person bathing (including washing, rinsing, drying)?: None Help from another person to put on and taking off regular upper body clothing?: None Help from another person to put on and taking off regular lower body clothing?: None 6 Click Score: 24   End of Session Nurse Communication: Mobility status  Activity Tolerance: Patient tolerated treatment well Patient left: in bed;with call bell/phone within reach  OT Visit Diagnosis: Unsteadiness on feet (R26.81)                Time: 1340-1350 OT Time Calculation (min): 10 min Charges:  OT  General Charges $OT Visit: 1 Visit OT Evaluation $OT Eval Moderate Complexity: 1 Mod G-Codes:     Reynolds AmericanWendi Lanna Labella, OTR/L [phone]   Virgina OrganConarpe, Hermon Zea M 05/27/2017, 2:02 PM

## 2017-05-27 NOTE — Discharge Summary (Signed)
Physician Discharge Summary  Alexandra Richardson WUJ:[id number] DOB: March 20, 1950 DOA: 05/25/2017  PCP: Ailene Ravel, MD  Admit date: 05/25/2017 Discharge date: 05/27/2017  Time spent: 45 minutes  Recommendations for Outpatient Follow-up:  Patient will be discharged to home with outpatient physical therapy.  Patient will need to follow up with primary care provider within one week of discharge, repeat BMP and discuss resumption of lexapro and lasix.  Patient should continue medications as prescribed.  Patient should follow a regular diet.   Discharge Diagnoses:  Hyponatremia with dehydration Acute encephalopathy, metabolic Dyspnea/Possible COPD exacerbation Essential hypertension Hyperlipidemia Anxiety Tobacco abuse Frequent falls Diabetes mellitus, type II Hypokalemia  Discharge Condition: Stable  Diet recommendation: regular  Filed Weights   05/26/17 0151  Weight: 76 kg (167 lb 8.8 oz)    History of present illness:  on 05/25/2017 by Dr. Sharyn Lull a 67 y.o.femalewith medical history significant ofHLD, HTN, and anxietywho presents via EMS from her PCP for AMS. Her PCP is concerned that this is symptomatic hyponatremia. The patient states that she went to the doctor because "I thought I was gonna get my Xanax back."By her history,+confused - today only. "Forthe past three times."+sob X 2-3 days. +cough, productive of scant amount of yellow looking sputum.  She was admitted to [facility] from 3/22-25 with syncope thought to be related to orthostatic hypotension and dehydration. She had a negative head CT; normal EKG/tele; unremarkable echo with grade 1 DD. Na++ 124 on presentation, increased to 130. Lasix was changed to prn on discharge. She was recommended to go to SNF but declined and was to go home with her sister, but she has instead been living alone.   She returned to the ER on 4/15 after a fall. She was rehydrated and discharged home.  She returned on 4/16 after falling twice in the parking lot after ER discharge. Na++ 130, up from 125. She was again discharged from the ER.   Hospital Course:  Hyponatremia with dehydration -Likely hypovolemic hyponatremia as patient reported decreased oral intake -Recently admitted in March for similar presentation -Sodium on admission 123, currently 132 -Patient placed on IV fluids -Lexapro as well as Lasix held-discuss resumption with PCP -Of note, patient had CTA chest on 05/23/2017 which was negative for pulmonary embolism, no mention of malignancy- SIADH unlikely -Repeat BMP in one week  Acute encephalopathy, metabolic -Resolved, Suspect secondary to the above -Patient lives alone and has had frequent ED visits and admissions for recurrent falls -Patient currently alert and oriented x3  Dyspnea/Possible COPD exacerbation -Possibly related to anxiety as well as possible underlying COPD -Patient has a long history of smoking -Continue albuterol as well as Doxy, prednisone for possible COPD exacerbation -continue spiriva at discharge  Essential hypertension -Continue ramipril  Hyperlipidemia -Continue statin  Anxiety -Continue BuSpar, hydroxyzine -Xanax was discontinued previously -Lexapro was held due to hyponatremia  Tobacco abuse -Smoking cessation discussed, continue nicotine patch  Frequent falls -Likely secondary to the above -PT/OT consulted and appreciated- recommended outpatient PT -Last admission, SNF placement was recommended however patient declined and was discharged home with home health -patient lives alone  Diabetes mellitus, type II -Metformin held-resume upon discharge  Hypokalemia -replaced, repeat BMP in one week  Procedures: none  Consultations: None  Code status: DNR  Discharge Exam: Vitals:   05/27/17 1040 05/27/17 1307  BP: (!) 163/95 (!) 153/80  Pulse: 94 (!) 103  Resp: 20 20  Temp: 98.3 F (36.8 C) 98.6 F (37  C)  SpO2: 97% 95%   Patient states she is feeling better. Denies chest pain, shortness of breath, abdominal pain, nausea, vomiting, diarrhea, constipation. She would like to go home.    General: Well developed, well nourished, NAD, appears stated age  HEENT: NCAT, mucous membranes moist.  Cardiovascular: S1 S2 auscultated, RRR, no murmur  Respiratory: Clear to auscultation bilaterally with equal chest rise  Abdomen: Soft, nontender, nondistended, + bowel sounds  Extremities: warm dry without cyanosis clubbing or edema  Neuro: AAOx3, nonfocal  Skin: Without rashes exudates or nodules, multiple bruises  Psych: appropriate mood and affect  Discharge Instructions Discharge Instructions    Ambulatory referral to Physical Therapy   Complete by:  As directed    Discharge instructions   Complete by:  As directed    Patient will be discharged to home with outpatient physical therapy.  Patient will need to follow up with primary care provider within one week of discharge, repeat BMP and discuss resumption of lexapro and lasix.  Patient should continue medications as prescribed.  Patient should follow a regular diet.     Allergies as of 05/27/2017      Reactions   Bee Venom Other (See Comments)   Codeine Nausea And Vomiting      Medication List    STOP taking these medications   escitalopram 20 MG tablet Commonly known as:  LEXAPRO   furosemide 40 MG tablet Commonly known as:  LASIX     TAKE these medications   albuterol (2.5 MG/3ML) 0.083% nebulizer solution Commonly known as:  PROVENTIL Take 3 mLs (2.5 mg total) by nebulization every 4 (four) hours as needed for wheezing or shortness of breath. Use neb or proventil but not both at the same time   albuterol 108 (90 Base) MCG/ACT inhaler Commonly known as:  PROVENTIL HFA;VENTOLIN HFA Inhale 2 puffs into the lungs every 6 (six) hours as needed for shortness of breath. For shortness of breath.   atorvastatin 80 MG  tablet Commonly known as:  LIPITOR Take 80 mg by mouth daily.   busPIRone 5 MG tablet Commonly known as:  BUSPAR Take 5 mg by mouth 3 (three) times daily.   doxycycline 100 MG tablet Commonly known as:  VIBRA-TABS Take 1 tablet (100 mg total) by mouth every 12 (twelve) hours.   fluticasone 50 MCG/ACT nasal spray Commonly known as:  FLONASE Place 2 sprays into both nostrils daily.   gabapentin 600 MG tablet Commonly known as:  NEURONTIN Take 1 tablet (600 mg total) by mouth at bedtime.   guaiFENesin 600 MG 12 hr tablet Commonly known as:  MUCINEX Take 1 tablet (600 mg total) by mouth 2 (two) times daily.   hydrOXYzine 10 MG tablet Commonly known as:  ATARAX/VISTARIL Take 1 tablet (10 mg total) by mouth 3 (three) times daily as needed for anxiety.   magnesium oxide 400 MG tablet Commonly known as:  MAG-OX Take 400 mg by mouth 3 (three) times daily.   metFORMIN 1000 MG tablet Commonly known as:  GLUCOPHAGE Take 1,000 mg by mouth 2 (two) times daily with a meal.   nicotine 14 mg/24hr patch Commonly known as:  NICODERM CQ - dosed in mg/24 hours Place 1 patch (14 mg total) onto the skin daily.   omeprazole 20 MG capsule Commonly known as:  PRILOSEC Take 20 mg by mouth daily.   predniSONE 5 MG tablet Commonly known as:  DELTASONE Take in the morning: take 3 tablets x 2 days, then 2 tablets x  2 days, then 1 tablet x 1 day.   ramipril 2.5 MG capsule Commonly known as:  ALTACE Take 2.5 mg by mouth daily.   tiotropium 18 MCG inhalation capsule Commonly known as:  SPIRIVA Place 1 capsule (18 mcg total) into inhaler and inhale daily.      Allergies  Allergen Reactions  . Bee Venom Other (See Comments)  . Codeine Nausea And Vomiting   Follow-up Information    Hamrick, Maura L, MD. Schedule an appointment as soon as possible for a visit in 1 week(s).   Specialty:  Family Medicine Why:  Hospital follow up Contact information: [address] [place]  [address] [phone]        [facility] Follow up.   Specialty:  Rehabilitation Why:  They will call you to set up appointment Contact information: [address] [id number] mc [place] [address] [phone]           The results of significant diagnostics from this hospitalization (including imaging, microbiology, ancillary and laboratory) are listed below for reference.    Significant Diagnostic Studies: Dg Chest 2 View  Result Date: 05/25/2017 CLINICAL DATA:  Pt c/o chronic cough and SOB for an unknown period of time. Hx of HTN. Pt is a former smoker. EXAM: CHEST - 2 VIEW COMPARISON:  Chest radiograph 05/22/2017 FINDINGS: Normal mediastinum and cardiac silhouette. Normal pulmonary vasculature. No evidence of effusion, infiltrate, or pneumothorax. No acute bony abnormality. IMPRESSION: No acute cardiopulmonary process. Electronically Signed   By: Genevive Bi M.D.   On: 05/25/2017 20:20   Dg Chest 2 View  Result Date: 05/22/2017 CLINICAL DATA:  67 y/o  F; 2 falls today. EXAM: CHEST - 2 VIEW COMPARISON:  04/28/2017 chest radiograph FINDINGS: Stable cardiac silhouette given projection and technique. Aortic atherosclerosis with calcification. Clear lungs. No pleural effusion or pneumothorax. No acute osseous abnormality is evident. IMPRESSION: No acute pulmonary process identified.  Aortic atherosclerosis. Electronically Signed   By: Mitzi Hansen M.D.   On: 05/22/2017 22:47   Dg Chest 2 View  Result Date: 04/28/2017 CLINICAL DATA:  Weakness for 2-3 days. EXAM: CHEST - 2 VIEW COMPARISON:  PA and lateral chest 02/22/2013 and 04/19/2010. FINDINGS: The lungs are clear. Heart size is normal. No pneumothorax or pleural effusion. No acute bony abnormality. IMPRESSION: No acute disease. Electronically Signed   By: Drusilla Kanner M.D.   On: 04/28/2017 15:47   Dg Knee 2 Views Left  Result Date: 05/22/2017 CLINICAL  DATA:  Pt States she has fallen 2x today at home and doesn't remember what caused the falls. She reports left hip and bilateral knee pain. Abrasion to left anterior knee. Left hurts worse than right. Both hurt "all over" Chronic coug.*comment was truncated* EXAM: LEFT KNEE - 1-2 VIEW COMPARISON:  None. FINDINGS: There is narrowing of the medial compartment with osteophytosis. Spurring of the patella. No joint effusion. No fracture. IMPRESSION: Osteoarthritis of the medial compartment.  No acute findings. Electronically Signed   By: Genevive Bi M.D.   On: 05/22/2017 22:38   Dg Knee 2 Views Right  Result Date: 05/22/2017 CLINICAL DATA:  Pt States she has fallen 2x today at home and doesn't remember what caused the falls. She reports left hip and bilateral knee pain. Abrasion to left knee. Left hurts worse than right. Chronic cough - current smoker of 1 pk per.*comment was truncated* EXAM: RIGHT KNEE - 1-2 VIEW COMPARISON:  None. FINDINGS: Narrowing of the lateral compartment osteophytosis. Osteophytosis  of the medial compartment. Spurring of the patella. No joint effusion. 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 No acute findings. Electronically Signed   By: Genevive BiStewart  Edmunds M.D.   On: 05/22/2017 22:40   Dg Knee 1-2 Views Right  Result Date: 04/30/2017 CLINICAL DATA:  Chronic right knee pain. Pain is worse with ambulation. EXAM: RIGHT KNEE - 1-2 VIEW COMPARISON:  Right knee radiographs 10/20/2010. FINDINGS: There is significant progression of tricompartmental degenerative changes in the right knee. Degenerative changes are most severe in the lateral and patellofemoral compartments. A small joint effusion is present. No acute fracture is present. Loose bodies are noted posteriorly within the joint, increased from the prior exam. IMPRESSION: 1. Progressive tricompartmental degenerative changes of the right knee. 2. Increased number of loose bodies within the joint.  3. Small joint effusion. 4. No other acute abnormality. Electronically Signed   By: Marin Robertshristopher  Mattern M.D.   On: 04/30/2017 13:50   Ct Head Wo Contrast  Result Date: 05/22/2017 CLINICAL DATA:  Status post 2 falls today, with left cheek abrasion. Concern for head or cervical spine injury. Initial encounter. EXAM: CT HEAD WITHOUT CONTRAST CT MAXILLOFACIAL WITHOUT CONTRAST CT CERVICAL SPINE WITHOUT CONTRAST TECHNIQUE: Multidetector CT imaging of the head, cervical spine, and maxillofacial structures were performed using the standard protocol without intravenous contrast. Multiplanar CT image reconstructions of the cervical spine and maxillofacial structures were also generated. COMPARISON:  CT of the head performed 04/28/2017, and CT of the head and cervical spine performed 11/15/2009. MRI of the brain and neck performed 11/17/2009 FINDINGS: CT HEAD FINDINGS Brain: No evidence of acute infarction, hemorrhage, hydrocephalus, extra-axial collection or mass lesion/mass effect. A cavum septum pellucidum is noted. The posterior fossa, including the cerebellum, brainstem and fourth ventricle, is within normal limits. The third and lateral ventricles, and basal ganglia are unremarkable in appearance. The cerebral hemispheres are symmetric in appearance, with normal gray-white differentiation. No mass effect or midline shift is seen. Vascular: No hyperdense vessel or unexpected calcification. Skull: There is no evidence of fracture; visualized osseous structures are unremarkable in appearance. Other: No significant soft tissue abnormalities are seen. CT MAXILLOFACIAL FINDINGS Osseous: There is no evidence of fracture or dislocation. The maxilla and mandible appear intact. The nasal bone is unremarkable in appearance. There is chronic absence of the dentition. Orbits: The orbits are intact bilaterally. Sinuses: Mucosal thickening is noted at the maxillary sinuses bilaterally. The remaining visualized paranasal sinuses  and mastoid air cells are well-aerated. Soft tissues: No significant soft tissue abnormalities are seen. The parapharyngeal fat planes are preserved. The nasopharynx, oropharynx and hypopharynx are unremarkable in appearance. The visualized portions of the valleculae and piriform sinuses are grossly unremarkable. The parotid and submandibular glands are within normal limits. No cervical lymphadenopathy is seen. CT CERVICAL SPINE FINDINGS Alignment: Normal. Skull base and vertebrae: No acute fracture. No primary bone lesion or focal pathologic process. Soft tissues and spinal canal: No prevertebral fluid or swelling. No visible canal hematoma. Disc levels: Intervertebral disc spaces are grossly preserved. Scattered anterior and posterior disc osteophyte complexes are noted along the cervical spine. Mild degenerative change is noted at the dens. Upper chest: There is asymmetric prominence of the right thyroid lobe, with a suspected 1.8 cm nodule. The visualized lung apices are clear. Other: No additional soft tissue abnormalities are seen. IMPRESSION: 1. No evidence of traumatic intracranial injury or fracture. 2. No evidence of fracture or subluxation along the cervical spine. 3. No evidence of fracture or dislocation  with regard to the maxillofacial structures. 4. Mucosal thickening at the maxillary sinuses bilaterally. 5. Mild degenerative change along the cervical spine. 6. **An incidental finding of potential clinical significance has been found. Asymmetric prominence of the right thyroid lobe, with a suspected 1.8 cm nodule. Consider further evaluation with thyroid ultrasound. If patient is clinically hyperthyroid, consider nuclear medicine thyroid uptake and scan.** Electronically Signed   By: Roanna Raider M.D.   On: 05/22/2017 22:12   Ct Head Wo Contrast  Result Date: 04/28/2017 CLINICAL DATA:  67 year old female with syncope versus presyncope at home this morning. EXAM: CT HEAD WITHOUT CONTRAST  TECHNIQUE: Contiguous axial images were obtained from the base of the skull through the vertex without intravenous contrast. COMPARISON:  Brain MRI 11/17/2009. Head CT without contrast 11/15/2009. FINDINGS: Brain: Cerebral volume remains normal for age. Cavum septum pellucidum (normal variant). No midline shift, ventriculomegaly, mass effect, evidence of mass lesion, intracranial hemorrhage or evidence of cortically based acute infarction. Scattered mild for age cerebral white matter hypodensity appears increased since 2011. No cortical encephalomalacia identified. Vascular: Calcified atherosclerosis at the skull base. No suspicious intracranial vascular hyperdensity. Skull: Stable and negative. Sinuses/Orbits: Clear. Other: Visualized orbit soft tissues are within normal limits. Visualized scalp soft tissues are within normal limits. IMPRESSION: 1.  No acute intracranial abnormality. 2. Mild for age nonspecific white matter changes appear progressed since 2011, most commonly due to small vessel disease. Electronically Signed   By: Odessa Fleming M.D.   On: 04/28/2017 15:59   Ct Angio Chest Pe W And/or Wo Contrast  Result Date: 05/23/2017 CLINICAL DATA:  Slurred speech and confusion. Multiple falls today. Elevated D-dimer. EXAM: CT ANGIOGRAPHY CHEST WITH CONTRAST TECHNIQUE: Multidetector CT imaging of the chest was performed using the standard protocol during bolus administration of intravenous contrast. Multiplanar CT image reconstructions and MIPs were obtained to evaluate the vascular anatomy. CONTRAST:  ISOVUE-370 IOPAMIDOL (ISOVUE-370) INJECTION 76% COMPARISON:  Chest radiograph April 21, 2017 FINDINGS: CARDIOVASCULAR: Adequate contrast opacification of the pulmonary artery's. Main pulmonary artery is not enlarged. No pulmonary arterial filling defects to the level of the subsegmental branches. Heart size is upper limits of normal, no right heart strain. Mild coronary artery calcifications. Trace pericardial  effusion. Thoracic aorta is normal course and caliber, mild calcific atherosclerosis aortic arch. LEFT vertebral artery arises directly from the aortic arch. MEDIASTINUM/NODES: No lymphadenopathy by CT size criteria. LUNGS/PLEURA: Tracheobronchial tree is patent, no pneumothorax. Moderate bronchial wall thickening. LEFT lower lobe atelectasis. No pleural effusions, focal consolidations, pulmonary nodules or masses. UPPER ABDOMEN: Nonacute. 12 mm benign LEFT adrenal adenoma (0 Hounsfield units). Status post cholecystectomy MUSCULOSKELETAL: Mildly accentuated thoracic kyphosis with multilevel bridging ventral osteophytes. Review of the MIP images confirms the above findings. IMPRESSION: 1. No acute pulmonary embolism. 2. Bronchial wall thickening seen with bronchitis or reactive airway disease. No pneumonia. Aortic Atherosclerosis (ICD10-I70.0). Electronically Signed   By: Awilda Metro M.D.   On: 05/23/2017 04:14   Ct Cervical Spine Wo Contrast  Result Date: 05/22/2017 CLINICAL DATA:  Status post 2 falls today, with left cheek abrasion. Concern for head or cervical spine injury. Initial encounter. EXAM: CT HEAD WITHOUT CONTRAST CT MAXILLOFACIAL WITHOUT CONTRAST CT CERVICAL SPINE WITHOUT CONTRAST TECHNIQUE: Multidetector CT imaging of the head, cervical spine, and maxillofacial structures were performed using the standard protocol without intravenous contrast. Multiplanar CT image reconstructions of the cervical spine and maxillofacial structures were also generated. COMPARISON:  CT of the head performed 04/28/2017, and CT of the head and  cervical spine performed 11/15/2009. MRI of the brain and neck performed 11/17/2009 FINDINGS: CT HEAD FINDINGS Brain: No evidence of acute infarction, hemorrhage, hydrocephalus, extra-axial collection or mass lesion/mass effect. A cavum septum pellucidum is noted. The posterior fossa, including the cerebellum, brainstem and fourth ventricle, is within normal limits. The third  and lateral ventricles, and basal ganglia are unremarkable in appearance. The cerebral hemispheres are symmetric in appearance, with normal gray-white differentiation. No mass effect or midline shift is seen. Vascular: No hyperdense vessel or unexpected calcification. Skull: There is no evidence of fracture; visualized osseous structures are unremarkable in appearance. Other: No significant soft tissue abnormalities are seen. CT MAXILLOFACIAL FINDINGS Osseous: There is no evidence of fracture or dislocation. The maxilla and mandible appear intact. The nasal bone is unremarkable in appearance. There is chronic absence of the dentition. Orbits: The orbits are intact bilaterally. Sinuses: Mucosal thickening is noted at the maxillary sinuses bilaterally. The remaining visualized paranasal sinuses and mastoid air cells are well-aerated. Soft tissues: No significant soft tissue abnormalities are seen. The parapharyngeal fat planes are preserved. The nasopharynx, oropharynx and hypopharynx are unremarkable in appearance. The visualized portions of the valleculae and piriform sinuses are grossly unremarkable. The parotid and submandibular glands are within normal limits. No cervical lymphadenopathy is seen. CT CERVICAL SPINE FINDINGS Alignment: Normal. Skull base and vertebrae: No acute fracture. No primary bone lesion or focal pathologic process. Soft tissues and spinal canal: No prevertebral fluid or swelling. No visible canal hematoma. Disc levels: Intervertebral disc spaces are grossly preserved. Scattered anterior and posterior disc osteophyte complexes are noted along the cervical spine. Mild degenerative change is noted at the dens. Upper chest: There is asymmetric prominence of the right thyroid lobe, with a suspected 1.8 cm nodule. The visualized lung apices are clear. Other: No additional soft tissue abnormalities are seen. IMPRESSION: 1. No evidence of traumatic intracranial injury or fracture. 2. No evidence of  fracture or subluxation along the cervical spine. 3. No evidence of fracture or dislocation with regard to the maxillofacial structures. 4. Mucosal thickening at the maxillary sinuses bilaterally. 5. Mild degenerative change along the cervical spine. 6. **An incidental finding of potential clinical significance has been found. Asymmetric prominence of the right thyroid lobe, with a suspected 1.8 cm nodule. Consider further evaluation with thyroid ultrasound. If patient is clinically hyperthyroid, consider nuclear medicine thyroid uptake and scan.** Electronically Signed   By: Roanna Raider M.D.   On: 05/22/2017 22:12   Dg Hip Unilat With Pelvis 2-3 Views Left  Result Date: 05/23/2017 CLINICAL DATA:  Left hip pain after fall a few days ago. EXAM: DG HIP (WITH OR WITHOUT PELVIS) 2-3V LEFT COMPARISON:  Left hip x-rays from yesterday. FINDINGS: No acute fracture or dislocation. The pubic symphysis and sacroiliac joints are intact. The bilateral hip joint spaces are preserved. Osteopenia. Contrast within the bladder. IMPRESSION: 1. No acute osseous abnormality. If occult hip fracture is suspected or if the patient is unable to bear weight, MRI is the preferred modality for further evaluation. Electronically Signed   By: Obie Dredge M.D.   On: 05/23/2017 12:01   Dg Hip Unilat W Or Wo Pelvis 2-3 Views Left  Result Date: 05/22/2017 CLINICAL DATA:  Pt States she has fallen 2x today at home and doesn't remember what caused the falls. She reports left hip and bilateral knee pain. Abrasion to left knee. Left hurts worse than right. Chronic cough - current smoker of 1 pk per.*comment was truncated* EXAM: DG HIP (WITH  OR WITHOUT PELVIS) 2-3V LEFT COMPARISON:  None. FINDINGS: Hips are located. No evidence of pelvic fracture or sacral fracture. Dedicated view of the LEFT hip demonstrates no femoral neck fracture. IMPRESSION: No pelvic fracture or hip fracture. Electronically Signed   By: Genevive Bi M.D.   On:  05/22/2017 22:42   Ct Maxillofacial Wo Contrast  Result Date: 05/22/2017 CLINICAL DATA:  Status post 2 falls today, with left cheek abrasion. Concern for head or cervical spine injury. Initial encounter. EXAM: CT HEAD WITHOUT CONTRAST CT MAXILLOFACIAL WITHOUT CONTRAST CT CERVICAL SPINE WITHOUT CONTRAST TECHNIQUE: Multidetector CT imaging of the head, cervical spine, and maxillofacial structures were performed using the standard protocol without intravenous contrast. Multiplanar CT image reconstructions of the cervical spine and maxillofacial structures were also generated. COMPARISON:  CT of the head performed 04/28/2017, and CT of the head and cervical spine performed 11/15/2009. MRI of the brain and neck performed 11/17/2009 FINDINGS: CT HEAD FINDINGS Brain: No evidence of acute infarction, hemorrhage, hydrocephalus, extra-axial collection or mass lesion/mass effect. A cavum septum pellucidum is noted. The posterior fossa, including the cerebellum, brainstem and fourth ventricle, is within normal limits. The third and lateral ventricles, and basal ganglia are unremarkable in appearance. The cerebral hemispheres are symmetric in appearance, with normal gray-white differentiation. No mass effect or midline shift is seen. Vascular: No hyperdense vessel or unexpected calcification. Skull: There is no evidence of fracture; visualized osseous structures are unremarkable in appearance. Other: No significant soft tissue abnormalities are seen. CT MAXILLOFACIAL FINDINGS Osseous: There is no evidence of fracture or dislocation. The maxilla and mandible appear intact. The nasal bone is unremarkable in appearance. There is chronic absence of the dentition. Orbits: The orbits are intact bilaterally. Sinuses: Mucosal thickening is noted at the maxillary sinuses bilaterally. The remaining visualized paranasal sinuses and mastoid air cells are well-aerated. Soft tissues: No significant soft tissue abnormalities are seen. The  parapharyngeal fat planes are preserved. The nasopharynx, oropharynx and hypopharynx are unremarkable in appearance. The visualized portions of the valleculae and piriform sinuses are grossly unremarkable. The parotid and submandibular glands are within normal limits. No cervical lymphadenopathy is seen. CT CERVICAL SPINE FINDINGS Alignment: Normal. Skull base and vertebrae: No acute fracture. No primary bone lesion or focal pathologic process. Soft tissues and spinal canal: No prevertebral fluid or swelling. No visible canal hematoma. Disc levels: Intervertebral disc spaces are grossly preserved. Scattered anterior and posterior disc osteophyte complexes are noted along the cervical spine. Mild degenerative change is noted at the dens. Upper chest: There is asymmetric prominence of the right thyroid lobe, with a suspected 1.8 cm nodule. The visualized lung apices are clear. Other: No additional soft tissue abnormalities are seen. IMPRESSION: 1. No evidence of traumatic intracranial injury or fracture. 2. No evidence of fracture or subluxation along the cervical spine. 3. No evidence of fracture or dislocation with regard to the maxillofacial structures. 4. Mucosal thickening at the maxillary sinuses bilaterally. 5. Mild degenerative change along the cervical spine. 6. **An incidental finding of potential clinical significance has been found. Asymmetric prominence of the right thyroid lobe, with a suspected 1.8 cm nodule. Consider further evaluation with thyroid ultrasound. If patient is clinically hyperthyroid, consider nuclear medicine thyroid uptake and scan.** Electronically Signed   By: Roanna Raider M.D.   On: 05/22/2017 22:12    Microbiology: No results found for this or any previous visit (from the past 240 hour(s)).   Labs: Basic Metabolic Panel: Recent Labs  Lab 05/22/17 2245 05/23/17 1711  05/25/17 1758 05/26/17 0717 05/27/17 0441  NA 125* 130* 123* 129* 132*  K 3.7 3.6 3.7 3.7 3.4*  CL  84* 91* 84* 93* 97*  CO2 25 25 25 23 24   GLUCOSE 115* 119* 92 125* 100*  BUN 7 7 5* <5* <5*  CREATININE 0.84 0.74 0.87 0.74 0.67  CALCIUM 8.3* 8.2* 8.5* 8.6* 8.6*   Liver Function Tests: Recent Labs  Lab 05/22/17 2245 05/23/17 1711 05/25/17 1758  AST 23 20 24   ALT 29 26 22   ALKPHOS 36* 30* 27*  BILITOT 0.7 0.8 1.7*  PROT 6.6 6.4* 6.9  ALBUMIN 3.6 3.4* 3.7   No results for input(s): LIPASE, AMYLASE in the last 168 hours. Recent Labs  Lab 05/23/17 0120  AMMONIA 28   CBC: Recent Labs  Lab 05/22/17 2245 05/23/17 1711 05/25/17 1758 05/26/17 0717  WBC 14.0* 11.5* 13.9* 10.1  NEUTROABS 9.4* 7.4  --   --   HGB 13.6 13.1 13.3 12.9  HCT 38.2 37.6 36.8 36.9  MCV 83.6 85.5 83.4 84.4  PLT 192 183 226 207   Cardiac Enzymes: No results for input(s): CKTOTAL, CKMB, CKMBINDEX, TROPONINI in the last 168 hours. BNP: BNP (last 3 results) No results for input(s): BNP in the last 8760 hours.  ProBNP (last 3 results) No results for input(s): PROBNP in the last 8760 hours.  CBG: Recent Labs  Lab 05/22/17 2307  GLUCAP 126*       Signed:  Nariya Neumeyer  [facility] 05/27/2017, 2:05 PM

## 2017-05-27 NOTE — Care Management Note (Addendum)
Case Management Note  Patient Details  Name: Alexandra Richardson MRN: [id number] Date of Birth: [birth year]  Subjective/Objective:   Admitted for Hyponatremia with dehydration, Acute encephalopathy.        Action/Plan: Patient with discharge orders home today. NCM discussed recommendation for Outpatient PT with patient.  Explained to patient we could either do internal referral if she does not mind the drive since [place]; or she can get a written prescription at discharge, take that to provider closer to her area; or her PCP could set-up outpatient therapy also.  Patient selected the internal referral, offered choice and [facility] on [address]  NCM advised patient they will call her to set up appointment.  Patient verbalized understanding.  Expected Discharge Date:   05/27/2017               Expected Discharge Plan:  OP Rehab  In-House Referral:    N/A Discharge planning Services  CM Consult  Post Acute Care Choice:   Outpatient Physical Therapy Choice offered to:  Patient  DME Arranged:   N/A DME Agency:   N/A  HH Arranged:  (Outpatient Rehab) HH Agency:     Status of Service:  Completed, signed off  Yancey FlemingsKimberly R Becton, RN 05/27/2017, 1:57 PM

## 2017-08-08 ENCOUNTER — Other Ambulatory Visit: Payer: Self-pay | Admitting: Family Medicine

## 2017-08-08 DIAGNOSIS — Z720 Tobacco use: Secondary | ICD-10-CM

## 2017-08-22 ENCOUNTER — Ambulatory Visit: Payer: Self-pay

## 2017-09-08 ENCOUNTER — Ambulatory Visit: Payer: Self-pay

## 2017-09-18 ENCOUNTER — Ambulatory Visit
Admission: RE | Admit: 2017-09-18 | Discharge: 2017-09-18 | Disposition: A | Discharge status: Home / Self Care | Payer: Medicare Other | Source: Ambulatory Visit | Attending: Family Medicine | Admitting: Family Medicine

## 2017-09-18 DIAGNOSIS — Z720 Tobacco use: Secondary | ICD-10-CM

## 2017-12-15 ENCOUNTER — Other Ambulatory Visit: Payer: Self-pay | Admitting: Family Medicine

## 2017-12-15 DIAGNOSIS — Z1231 Encounter for screening mammogram for malignant neoplasm of breast: Secondary | ICD-10-CM

## 2018-01-29 ENCOUNTER — Ambulatory Visit
Admission: RE | Admit: 2018-01-29 | Discharge: 2018-01-29 | Disposition: A | Discharge status: Home / Self Care | Payer: Medicare Other | Source: Ambulatory Visit | Attending: Family Medicine | Admitting: Family Medicine

## 2018-01-29 DIAGNOSIS — Z1231 Encounter for screening mammogram for malignant neoplasm of breast: Secondary | ICD-10-CM

## 2018-03-15 DIAGNOSIS — H2512 Age-related nuclear cataract, left eye: Secondary | ICD-10-CM | POA: Diagnosis not present

## 2018-03-15 DIAGNOSIS — H25812 Combined forms of age-related cataract, left eye: Secondary | ICD-10-CM | POA: Diagnosis not present

## 2018-05-23 DIAGNOSIS — E782 Mixed hyperlipidemia: Secondary | ICD-10-CM | POA: Diagnosis not present

## 2018-05-23 DIAGNOSIS — K219 Gastro-esophageal reflux disease without esophagitis: Secondary | ICD-10-CM | POA: Diagnosis not present

## 2018-05-23 DIAGNOSIS — I10 Essential (primary) hypertension: Secondary | ICD-10-CM | POA: Diagnosis not present

## 2018-05-23 DIAGNOSIS — E119 Type 2 diabetes mellitus without complications: Secondary | ICD-10-CM | POA: Diagnosis not present

## 2018-07-19 ENCOUNTER — Other Ambulatory Visit: Payer: Self-pay

## 2018-07-19 ENCOUNTER — Emergency Department (HOSPITAL_COMMUNITY)
Admission: EM | Admit: 2018-07-19 | Discharge: 2018-07-19 | Disposition: A | Discharge status: Home / Self Care | Payer: Medicare Other | Attending: Emergency Medicine | Admitting: Emergency Medicine

## 2018-07-19 ENCOUNTER — Encounter (HOSPITAL_COMMUNITY): Payer: Self-pay

## 2018-07-19 DIAGNOSIS — R000 Tachycardia, unspecified: Secondary | ICD-10-CM | POA: Insufficient documentation

## 2018-07-19 DIAGNOSIS — Z7982 Long term (current) use of aspirin: Secondary | ICD-10-CM | POA: Diagnosis not present

## 2018-07-19 DIAGNOSIS — I471 Supraventricular tachycardia: Secondary | ICD-10-CM | POA: Diagnosis not present

## 2018-07-19 DIAGNOSIS — R0902 Hypoxemia: Secondary | ICD-10-CM | POA: Diagnosis not present

## 2018-07-19 DIAGNOSIS — F17210 Nicotine dependence, cigarettes, uncomplicated: Secondary | ICD-10-CM | POA: Diagnosis not present

## 2018-07-19 DIAGNOSIS — R9431 Abnormal electrocardiogram [ECG] [EKG]: Secondary | ICD-10-CM | POA: Diagnosis not present

## 2018-07-19 DIAGNOSIS — I499 Cardiac arrhythmia, unspecified: Secondary | ICD-10-CM | POA: Diagnosis not present

## 2018-07-19 DIAGNOSIS — Z79899 Other long term (current) drug therapy: Secondary | ICD-10-CM | POA: Diagnosis not present

## 2018-07-19 DIAGNOSIS — J449 Chronic obstructive pulmonary disease, unspecified: Secondary | ICD-10-CM | POA: Diagnosis not present

## 2018-07-19 LAB — CBC WITH DIFFERENTIAL/PLATELET
Abs Immature Granulocytes: 0.07 10*3/uL (ref 0.00–0.07)
Basophils Absolute: 0.1 10*3/uL (ref 0.0–0.1)
Basophils Relative: 1 %
Eosinophils Absolute: 0.2 10*3/uL (ref 0.0–0.5)
Eosinophils Relative: 2 %
HCT: 45.7 % (ref 36.0–46.0)
Hemoglobin: 15.3 g/dL — ABNORMAL HIGH (ref 12.0–15.0)
Immature Granulocytes: 1 %
Lymphocytes Relative: 33 %
Lymphs Abs: 3.8 10*3/uL (ref 0.7–4.0)
MCH: 29.9 pg (ref 26.0–34.0)
MCHC: 33.5 g/dL (ref 30.0–36.0)
MCV: 89.4 fL (ref 80.0–100.0)
Monocytes Absolute: 0.5 10*3/uL (ref 0.1–1.0)
Monocytes Relative: 5 %
Neutro Abs: 6.7 10*3/uL (ref 1.7–7.7)
Neutrophils Relative %: 58 %
Platelets: 198 10*3/uL (ref 150–400)
RBC: 5.11 MIL/uL (ref 3.87–5.11)
RDW: 13 % (ref 11.5–15.5)
WBC: 11.3 10*3/uL — ABNORMAL HIGH (ref 4.0–10.5)
nRBC: 0 % (ref 0.0–0.2)

## 2018-07-19 LAB — COMPREHENSIVE METABOLIC PANEL
ALT: 23 U/L (ref 0–44)
AST: 24 U/L (ref 15–41)
Albumin: 3.9 g/dL (ref 3.5–5.0)
Alkaline Phosphatase: 22 U/L — ABNORMAL LOW (ref 38–126)
Anion gap: 12 (ref 5–15)
BUN: 9 mg/dL (ref 8–23)
CO2: 23 mmol/L (ref 22–32)
Calcium: 9.4 mg/dL (ref 8.9–10.3)
Chloride: 98 mmol/L (ref 98–111)
Creatinine, Ser: 0.81 mg/dL (ref 0.44–1.00)
GFR calc Af Amer: >60 mL/min (ref >60)
GFR calc non Af Amer: >60 mL/min (ref >60)
Glucose, Bld: 297 mg/dL — ABNORMAL HIGH (ref 70–99)
Potassium: 4.6 mmol/L (ref 3.5–5.1)
Sodium: 133 mmol/L — ABNORMAL LOW (ref 135–145)
Total Bilirubin: 0.8 mg/dL (ref 0.3–1.2)
Total Protein: 7 g/dL (ref 6.5–8.1)

## 2018-07-19 LAB — URINALYSIS, ROUTINE W REFLEX MICROSCOPIC
Bilirubin Urine: NEGATIVE
Glucose, UA: >=500 mg/dL — AB
Hgb urine dipstick: NEGATIVE
Ketones, ur: NEGATIVE mg/dL
Nitrite: NEGATIVE
Protein, ur: NEGATIVE mg/dL
Specific Gravity, Urine: 1.008 (ref 1.005–1.030)
pH: 6 (ref 5.0–8.0)

## 2018-07-19 LAB — TSH: TSH: 1.951 u[IU]/mL (ref 0.350–4.500)

## 2018-07-19 MED ORDER — DILTIAZEM HCL 25 MG/5ML IV SOLN
10.0000 mg | Freq: Once | INTRAVENOUS | Status: DC
  Filled 2018-07-19: qty 5

## 2018-07-19 MED ORDER — SODIUM CHLORIDE 0.9 % IV BOLUS
1000.0000 mL | Freq: Once | INTRAVENOUS | Status: AC
  Administered 2018-07-19: 1000 mL via INTRAVENOUS

## 2018-07-19 NOTE — ED Provider Notes (Signed)
[facility] Provider Note   CSN: [id number] Arrival date & time: 07/19/18  [id number]    History   Chief Complaint Chief Complaint  Patient presents with  . sinus tachycardia    HPI Alexandra Richardson is a 68 y.o. female.  Presents emergency department with chief complaint of fast heart rate.  Patient states that she was scheduled for ophthalmologic surgery today for removal of the cataract on the right eye.  She had her eye dilated and then her heart began racing.  Patient states that she was unaware her heart was beating fast.  She denies chest pain, shortness of breath.  She has history of COPD but denies history of arrhythmia or tachycardia.  She states "they sent me here because my heart was fast."    st     Past Medical History:  Diagnosis Date  . Anxiety   . Anxiety   . Arthritis   . Depression   . Grief 07/03/2011  . Hyperlipidemia   . Hypertension     Patient Active Problem List   Diagnosis Date Noted  . Dyspnea 05/25/2017  . Tobacco dependence 05/25/2017  . Near syncope 04/28/2017  . Dehydration with hyponatremia 04/28/2017  . Pneumonia 02/07/2013  . Hypokalemia 07/03/2011  . Grief 07/03/2011  . Encephalopathy 07/02/2011  . Hyperlipidemia 07/02/2011  . Anxiety 07/02/2011  . Overdose 07/01/2011  . Altered mental status 07/01/2011  . Hypertension 07/01/2011  . Depression 07/01/2011  . UTI (lower urinary tract infection) 07/01/2011    Past Surgical History:  Procedure Laterality Date  . CHOLECYSTECTOMY       OB History   No obstetric history on file.      Home Medications    Prior to Admission medications   Medication Sig Start Date End Date Taking? Authorizing Provider  amitriptyline (ELAVIL) 150 MG tablet Take 150 mg by mouth at bedtime.   Yes [provider]  aspirin EC 81 MG tablet Take 81 mg by mouth daily.   Yes [provider]  atorvastatin (LIPITOR) 80 MG tablet Take 80 mg by mouth  daily.    Yes [provider]  busPIRone (BUSPAR) 15 MG tablet Take 5 mg by mouth 2 (two) times daily.  05/04/17  Yes [provider]  celecoxib (CELEBREX) 200 MG capsule Take 200 mg by mouth 2 (two) times daily as needed for mild pain or moderate pain.   Yes [provider]  gabapentin (NEURONTIN) 600 MG tablet Take 1 tablet (600 mg total) by mouth at bedtime. Patient taking differently: Take 600 mg by mouth 3 (three) times daily.  05/01/17  Yes Albertine GratesXu, Fang, MD  ramipril (ALTACE) 10 MG capsule Take 10 mg by mouth daily.    Yes [provider]  risperiDONE (RISPERDAL) 2 MG tablet Take 2 mg by mouth 2 (two) times daily.   Yes [provider]  albuterol (PROVENTIL HFA;VENTOLIN HFA) 108 (90 BASE) MCG/ACT inhaler Inhale 2 puffs into the lungs every 6 (six) hours as needed for shortness of breath. For shortness of breath. Patient not taking: Reported on 07/19/2018 02/12/13   Jeanella Crazellis, Brandi L, NP  albuterol (PROVENTIL) (2.5 MG/3ML) 0.083% nebulizer solution Take 3 mLs (2.5 mg total) by nebulization every 4 (four) hours as needed for wheezing or shortness of breath. Use neb or proventil but not both at the same time Patient not taking: Reported on 07/19/2018 02/12/13   Jeanella Crazellis, Brandi L, NP  doxycycline (VIBRA-TABS) 100 MG tablet Take 1  tablet (100 mg total) by mouth every 12 (twelve) hours. Patient not taking: Reported on 07/19/2018 05/27/17   Edsel PetrinMikhail, Maryann, DO  fluticasone [facility](FLONASE) 50 MCG/ACT nasal spray Place 2 sprays into both nostrils daily. Patient not taking: Reported on 07/19/2018 05/02/17   Albertine GratesXu, Fang, MD  guaiFENesin (MUCINEX) 600 MG 12 hr tablet Take 1 tablet (600 mg total) by mouth 2 (two) times daily. Patient not taking: Reported on 07/19/2018 05/01/17   Albertine GratesXu, Fang, MD  hydrOXYzine (ATARAX/VISTARIL) 10 MG tablet Take 1 tablet (10 mg total) by mouth 3 (three) times daily as needed for anxiety. Patient not taking: Reported on 07/19/2018 05/01/17   Albertine GratesXu, Fang, MD  nicotine  (NICODERM CQ - DOSED IN MG/24 HOURS) 14 mg/24hr patch Place 1 patch (14 mg total) onto the skin daily. Patient not taking: Reported on 07/19/2018 05/27/17   Edsel PetrinMikhail, Maryann, DO  predniSONE (DELTASONE) 5 MG tablet Take in the morning: take 3 tablets x 2 days, then 2 tablets x 2 days, then 1 tablet x 1 day. Patient not taking: Reported on 07/19/2018 05/27/17   Edsel PetrinMikhail, Maryann, DO  tiotropium (SPIRIVA) 18 MCG inhalation capsule Place 1 capsule (18 mcg total) into inhaler and inhale daily. Patient not taking: Reported on 07/19/2018 02/12/13   Jeanella Crazellis, Brandi L, NP    Family History Family History  Problem Relation Age of Onset  . Coronary artery disease Father   . Diabetes type II Sister   . Hypertension Mother   . Breast cancer Neg Hx     Social History Social History   Tobacco Use  . Smoking status: Current Every Day Smoker    Packs/day: 1.00    Years: 40.00    Pack years: 40.00    Types: Cigarettes  . Smokeless tobacco: Never Used  Substance Use Topics  . Alcohol use: Yes    Comment: occasionally  . Drug use: No     Allergies   Bee venom and Codeine   Review of Systems Review of Systems Ten systems reviewed and are negative for acute change, except as noted in the HPI.    Physical Exam Updated Vital Signs BP (!) 148/84 (BP Location: Right Arm)   Pulse (!) 102   Temp 98.8 F (37.1 C) (Oral)   Resp 18   Ht 5\' 3"  (1.6 m)   Wt 81.6 kg   SpO2 93%   BMI 31.89 kg/m   Physical Exam  Physical Exam  Nursing note and vitals reviewed. Constitutional: She is oriented to person, place, and time. She appears well-developed and well-nourished. No distress.  Smells slightly of cigarettes HENT:  Head: Normocephalic and atraumatic.  Eyes: Conjunctivae normal and EOM are normal. Pupils are equal, round, and reactive to light. No scleral icterus.  Neck: Normal range of motion.  Cardiovascular: Tachycardic regular rhythm and normal heart sounds.  Exam reveals no gallop and no  friction rub.   No murmur heard. Pulmonary/Chest: Effort normal and breath sounds normal. No respiratory distress.  Abdominal: Soft. Bowel sounds are normal. She exhibits no distension and no mass. There is no tenderness. There is no guarding.  Neurological: She is alert and oriented to person, place, and time.  Skin: Skin is warm and dry. She is not diaphoretic.    ED Treatments / Results  Labs (all labs ordered are listed, but only abnormal results are displayed) Labs Reviewed  COMPREHENSIVE METABOLIC PANEL - Abnormal; Notable for the following components:      Result Value   Sodium 133 (*)  Glucose, Bld 297 (*)    Alkaline Phosphatase 22 (*)    All other components within normal limits  CBC WITH DIFFERENTIAL/PLATELET - Abnormal; Notable for the following components:   WBC 11.3 (*)    Hemoglobin 15.3 (*)    All other components within normal limits  URINALYSIS, ROUTINE W REFLEX MICROSCOPIC - Abnormal; Notable for the following components:   APPearance HAZY (*)    Glucose, UA >=500 (*)    Leukocytes,Ua LARGE (*)    Bacteria, UA FEW (*)    All other components within normal limits  TSH    EKG EKG Interpretation  Date/Time:  Thursday July 19 2018 11:29:29 EDT Ventricular Rate:  103 PR Interval:    QRS Duration: 83 QT Interval:  325 QTC Calculation: 426 R Axis:   28 Text Interpretation:  Sinus tachycardia Atrial premature complex Nonspecific T wave abnormality Confirmed by Cathren LaineSteinl, Kevin ([id number]) on 07/19/2018 12:05:29 PM   Radiology No results found.  Procedures Procedures (including critical care time)  Medications Ordered in ED Medications  sodium chloride 0.9 % bolus 1,000 mL (0 mLs Intravenous Stopped 07/19/18 1320)     Initial Impression / Assessment and Plan / ED Course  I have reviewed the triage vital signs and the nursing notes.  Pertinent labs & imaging results that were available during my care of the patient were reviewed by me and considered in my  medical decision making (see chart for details).  Clinical Course as of Jul 20 2298  Thu Jul 19, 2018  1007 EKG 12-Lead [AH]    Clinical Course User Index [AH] Arthor CaptainHarris, Malyk Girouard, PA-C       .CC: Tachycardia VS: Vitals:   07/19/18 1145 07/19/18 1230 07/19/18 1315 07/19/18 1404  BP: 139/80 138/83 (!) 153/93 (!) 148/84  Pulse: (!) 103 (!) 110 (!) 103 (!) 102  Resp: 14 (!) 28 19 18   Temp:      TempSrc:      SpO2: 94% 94% 94% 93%  Weight:      Height:       NG:[id number]:History is gathered by patient and review of EMR. DDX: Differential diagnosis includes sinus tachycardia, SVT, junctional tachycardia, pulmonary embolus, dehydration Labs: I reviewed the labs which show asymptomatic bacteriuria elevated blood glucose, mild hyponatremia in the setting of high blood sugar.  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  I have high suspicion that her sinus tachycardia is secondary to medication drops given to her today. Patient heart rate has improved significantly without intervention and time alone.  I have discussed case with Dr. Denton LankSteinl who agrees that there is no emergent cause of the sinus tachycardia today.  Discussed return precautions and outpatient follow-up Patient disposition: Discharge Patient condition: Good. The patient appears reasonably screened and/or stabilized for discharge and I doubt any other medical condition or other [facility] requiring further screening, evaluation, or treatment in the ED at this time prior to discharge. I have discussed lab and/or  imaging findings with the patient and answered all questions/concerns to the best of my ability. I have discussed  return precautions and OP follow up.   '  Final Clinical Impressions(s) / ED Diagnoses   Final diagnoses:  Sinus tachycardia    ED Discharge Orders    None       Margarita Mail, PA-C 07/21/18 2306    Lajean Saver, MD 07/23/18 [phone]

## 2018-07-19 NOTE — Discharge Instructions (Signed)
Contact a health care provider if you have: A fever. Vomiting or diarrhea that does not go away. Get help right away if you: Have pain in your chest, upper arms, jaw, or neck. Become weak or dizzy. Feel faint. Have palpitations that do not go away. 

## 2018-07-19 NOTE — ED Notes (Signed)
Pt has spoken to sister.

## 2018-07-19 NOTE — ED Triage Notes (Signed)
Pt BIB [facility] d/t  [facility]al [facility]. Pt  Was in SVT in 130s.

## 2018-07-24 DIAGNOSIS — R000 Tachycardia, unspecified: Secondary | ICD-10-CM | POA: Diagnosis not present

## 2018-07-27 ENCOUNTER — Telehealth: Payer: Self-pay

## 2018-07-27 NOTE — Telephone Encounter (Signed)
NOTES ON FILE FROM [facility]

## 2018-08-03 ENCOUNTER — Telehealth: Payer: Self-pay

## 2018-08-03 NOTE — Telephone Encounter (Signed)
FAXED NOTES TO [place] 

## 2018-08-06 ENCOUNTER — Other Ambulatory Visit: Payer: Self-pay

## 2018-08-06 ENCOUNTER — Ambulatory Visit (INDEPENDENT_AMBULATORY_CARE_PROVIDER_SITE_OTHER): Payer: Medicare Other | Admitting: Cardiology

## 2018-08-06 ENCOUNTER — Encounter: Payer: Self-pay | Admitting: Cardiology

## 2018-08-06 VITALS — BP 140/82 | HR 107 | Ht 63.0 in | Wt 185.0 lb

## 2018-08-06 DIAGNOSIS — I2510 Atherosclerotic heart disease of native coronary artery without angina pectoris: Secondary | ICD-10-CM | POA: Diagnosis not present

## 2018-08-06 DIAGNOSIS — R0609 Other forms of dyspnea: Secondary | ICD-10-CM | POA: Diagnosis not present

## 2018-08-06 DIAGNOSIS — R011 Cardiac murmur, unspecified: Secondary | ICD-10-CM

## 2018-08-06 DIAGNOSIS — R0600 Dyspnea, unspecified: Secondary | ICD-10-CM

## 2018-08-06 DIAGNOSIS — F17210 Nicotine dependence, cigarettes, uncomplicated: Secondary | ICD-10-CM

## 2018-08-06 DIAGNOSIS — R002 Palpitations: Secondary | ICD-10-CM | POA: Diagnosis not present

## 2018-08-06 DIAGNOSIS — F17200 Nicotine dependence, unspecified, uncomplicated: Secondary | ICD-10-CM

## 2018-08-06 DIAGNOSIS — E782 Mixed hyperlipidemia: Secondary | ICD-10-CM

## 2018-08-06 NOTE — Patient Instructions (Addendum)
Medication Instructions:  Your physician recommends that you continue on your current medications as directed. Please refer to the Current Medication list given to you today.  If you need a refill on your cardiac medications before your next appointment, please call your pharmacy.   Lab work: NONE If you have labs (blood work) drawn today and your tests are completely normal, you will receive your results only by: Marland Kitchen MyChart Message (if you have MyChart) OR . A paper copy in the mail If you have any lab test that is abnormal or we need to change your treatment, we will call you to review the results.  Testing/Procedures: Your physician has recommended that you wear an event monitor. Event monitors are medical devices that record the heart's electrical activity. Doctors most often [place] these monitors to diagnose arrhythmias. Arrhythmias are problems with the speed or rhythm of the heartbeat. The monitor is a small, portable device. You can wear one while you do your normal daily activities. This is usually used to diagnose what is causing palpitations/syncope (passing out).You will wear this device for 14 days.  YOU have been referred to pulmonology. They will call to schedule your consult.   Your physician has requested that you have an echocardiogram. Echocardiography is a painless test that uses sound waves to create images of your heart. It provides your doctor with information about the size and shape of your heart and how well your heart's chambers and valves are working. This procedure takes approximately one hour. There are no restrictions for this procedure.  Your physician has requested that you have a lexiscan myoview. For further information please visit [email or web address]. Please follow instruction sheet, as given.    Follow-Up: At [facility], you and your health needs are our priority.  As part of our continuing mission to provide you with exceptional heart care, we have  created designated Provider Care Teams.  These Care Teams include your primary Cardiologist (physician) and Advanced Practice Providers (APPs -  Physician Assistants and Nurse Practitioners) who all work together to provide you with the care you need, when you need it. You will need a follow up appointment in 4 months.    Any Other Special Instructions Will Be Listed Below

## 2018-08-06 NOTE — Progress Notes (Signed)
Cardiology Office Note:    Date:  08/06/2018   ID:  Alexandra Richardson, DOB 02/28/1950, MRN [id number]  PCP:  Alexandra Richardson, Lynn E, NP  Cardiologist:  Garwin Brothersajan R Revankar, MD   Referring MD: Alexandra Richardson, Lynn E, NP    ASSESSMENT:    1. Atherosclerosis of native coronary artery of native heart without angina pectoris   2. Tobacco dependence   3. Mixed hyperlipidemia   4. DOE (dyspnea on exertion)    PLAN:    In order of problems listed above:  1. Dyspnea on exertion: Patient has significant atherosclerotic burden on a calcium scoring.  Based on this and the fact that she leads a sedentary lifestyle and multiple risk factors for coronary artery disease we will do a Lexiscan sestamibi to assess for any objective evidence of coronary artery disease. 2. Essential hypertension: Blood pressure stable 3. Tachycardia: This is not clear to me as to why she has tachycardia.  Her TSH is fine.  We will do a ZIO monitor for 2 weeks.  I also recommended that she see a pulmonologist to see if this is originating for pulmonary issues and she is agreeable.  We will set up an appointment for this. 4. Echocardiogram will be done to assess murmur heard on auscultation. 5. 1 month follow-up or earlier if she has any concerns. 6. Cigarette smoking: I spent 5 minutes with the patient discussing solely about smoking. Smoking cessation was counseled. I suggested to the patient also different medications and pharmacological interventions. Patient is keen to try stopping on its own at this time. He will get back to me if he needs any further assistance in this matter.   Medication Adjustments/Labs and Tests Ordered: Current medicines are reviewed at length with the patient today.  Concerns regarding medicines are outlined above.  No orders of the defined types were placed in this encounter.  No orders of the defined types were placed in this encounter.    History of Present Illness:    Alexandra Richardson is a 68 y.o. female  who is being seen today for the evaluation of dyspnea on exertion and elevated calcium score at the request of Alexandra Richardson, Lynn E, NP.  Patient is a pleasant 68 year old female.  She is a chronic heavy smoker.  She has history of significant dyslipidemia and a markedly elevated calcium score.  She has shortness of breath on exertion and therefore she is referred here for evaluation.  She was to undergo cardiac surgery and was found to have elevated heart rate.  She mentions to me that her heart rate was about 145.  Subsequently emergency room evaluation of records reveals a heart rate of mostly in the 100s range less than 110 actually.  She denies any chest pain orthopnea PND or any palpitations.  At the time of my evaluation, the patient is alert awake oriented and in no distress.  She denies chest pain.  Past Medical History:  Diagnosis Date  . Anxiety   . Anxiety   . Arthritis   . Depression   . Grief 07/03/2011  . Hyperlipidemia   . Hypertension     Past Surgical History:  Procedure Laterality Date  . CHOLECYSTECTOMY      Current Medications: Current Meds  Medication Sig  . amitriptyline (ELAVIL) 150 MG tablet Take 150 mg by mouth at bedtime.  Marland Kitchen. aspirin EC 81 MG tablet Take 81 mg by mouth daily.  Marland Kitchen. atorvastatin (LIPITOR) 80 MG tablet Take 80 mg by mouth  daily.   . busPIRone (BUSPAR) 15 MG tablet Take 5 mg by mouth 2 (two) times daily.   . celecoxib (CELEBREX) 200 MG capsule Take 200 mg by mouth 2 (two) times daily as needed for mild pain or moderate pain.  . fluticasone (FLONASE) 50 MCG/ACT nasal spray Place 2 sprays into both nostrils daily.  Marland Kitchen. gabapentin (NEURONTIN) 600 MG tablet Take 1 tablet (600 mg total) by mouth at bedtime. (Patient taking differently: Take 600 mg by mouth 3 (three) times daily. )  . guaiFENesin (MUCINEX) 600 MG 12 hr tablet Take 1 tablet (600 mg total) by mouth 2 (two) times daily.  . hydrOXYzine (ATARAX/VISTARIL) 10 MG tablet Take 1 tablet (10 mg total) by mouth 3  (three) times daily as needed for anxiety.  . predniSONE (DELTASONE) 5 MG tablet Take in the morning: take 3 tablets x 2 days, then 2 tablets x 2 days, then 1 tablet x 1 day.  . ramipril (ALTACE) 10 MG capsule Take 10 mg by mouth daily.   . risperiDONE (RISPERDAL) 2 MG tablet Take 2 mg by mouth 2 (two) times daily.  Marland Kitchen. tiotropium (SPIRIVA) 18 MCG inhalation capsule Place 1 capsule (18 mcg total) into inhaler and inhale daily.     Allergies:   Bee venom and Codeine   Social History   Socioeconomic History  . Marital status: Legally Separated    Spouse name: Not on file  . Number of children: Not on file  . Years of education: Not on file  . Highest education level: Not on file  Occupational History  . Occupation: retired  Engineer, productionocial Needs  . Financial resource strain: Not on file  . Food insecurity    Worry: Not on file    Inability: Not on file  . Transportation needs    Medical: Not on file    Non-medical: Not on file  Tobacco Use  . Smoking status: Current Every Day Smoker    Packs/day: 1.00    Years: 40.00    Pack years: 40.00    Types: Cigarettes  . Smokeless tobacco: Never Used  Substance and Sexual Activity  . Alcohol use: Yes    Comment: occasionally  . Drug use: No  . Sexual activity: Not on file  Lifestyle  . Physical activity    Days per week: Not on file    Minutes per session: Not on file  . Stress: Not on file  Relationships  . Social Musicianconnections    Talks on phone: Not on file    Gets together: Not on file    Attends religious service: Not on file    Active member of club or organization: Not on file    Attends meetings of clubs or organizations: Not on file    Relationship status: Not on file  Other Topics Concern  . Not on file  Social History Narrative  . Not on file     Family History: The patient's family history includes Coronary artery disease in her father; Diabetes type II in her sister; Hypertension in her mother. There is no history of  Breast cancer.  ROS:   Please see the history of present illness.    All other systems reviewed and are negative.  EKGs/Labs/Other Studies Reviewed:    The following studies were reviewed today: As mentioned above.  I reviewed records including EKG and lab work including [facility].  I reviewed primary care physician records also.   Recent Labs: 07/19/2018:  ALT 23; BUN 9; Creatinine, Ser 0.81; Hemoglobin 15.3; Platelets 198; Potassium 4.6; Sodium 133; TSH 1.951  Recent Lipid Panel    Component Value Date/Time   CHOL  11/19/2008 2136    68        ATP III CLASSIFICATION:  <200     mg/dL   Desirable  200-239  mg/dL   Borderline High  >=240    mg/dL   High          TRIG 274 (H) 02/10/2013 2214   HDL 25 (L) 11/19/2008 2136   CHOLHDL 2.7 11/19/2008 2136   VLDL 21 11/19/2008 2136   [place]  11/19/2008 2136    22        Total Cholesterol/HDL:CHD Risk Coronary Heart Disease Risk Table                     Men   Women  1/2 Average Risk   3.4   3.3  Average Risk       5.0   4.4  2 X Average Risk   9.6   7.1  3 X Average Risk  23.4   11.0        Use the calculated Patient Ratio above and the CHD Risk Table to determine the patient's CHD Risk.        ATP III CLASSIFICATION (LDL):  <100     mg/dL   Optimal  100-129  mg/dL   Near or Above                    Optimal  130-159  mg/dL   Borderline  160-189  mg/dL   High  >190     mg/dL   Very High    Physical Exam:    VS:  BP 140/82 (BP Location: Left Arm, Patient Position: Sitting, Cuff Size: Normal)   Pulse (!) 107   Ht 5\' 3"  (1.6 m)   Wt 185 lb (83.9 kg)   SpO2 95%   BMI 32.77 kg/m     Wt Readings from Last 3 Encounters:  08/06/18 185 lb (83.9 kg)  07/19/18 180 lb (81.6 kg)  05/26/17 167 lb 8.8 oz (76 kg)     GEN: Patient is in no acute distress HEENT: Normal NECK: No JVD; No carotid bruits LYMPHATICS: No lymphadenopathy CARDIAC: S1 S2 regular, 2/6 systolic murmur at the apex. RESPIRATORY:   Clear to auscultation without rales, wheezing or rhonchi  ABDOMEN: Soft, non-tender, non-distended MUSCULOSKELETAL:  No edema; No deformity  SKIN: Warm and dry NEUROLOGIC:  Alert and oriented x 3 PSYCHIATRIC:  Normal affect    Signed, Jenean Lindau, MD  08/06/2018 3:56 PM     [facility]

## 2018-08-06 NOTE — Addendum Note (Signed)
Addended by: Beckey Rutter on: 08/06/2018 04:43 PM   Modules accepted: Orders

## 2018-08-07 ENCOUNTER — Other Ambulatory Visit: Payer: Self-pay

## 2018-08-07 ENCOUNTER — Ambulatory Visit: Payer: Medicare Other

## 2018-08-07 DIAGNOSIS — R002 Palpitations: Secondary | ICD-10-CM

## 2018-09-06 ENCOUNTER — Other Ambulatory Visit (INDEPENDENT_AMBULATORY_CARE_PROVIDER_SITE_OTHER): Payer: Medicare Other

## 2018-09-06 DIAGNOSIS — R002 Palpitations: Secondary | ICD-10-CM | POA: Diagnosis not present

## 2018-09-12 ENCOUNTER — Other Ambulatory Visit: Payer: Self-pay | Admitting: Cardiology

## 2018-09-12 DIAGNOSIS — R002 Palpitations: Secondary | ICD-10-CM | POA: Diagnosis not present

## 2018-09-18 ENCOUNTER — Telehealth (HOSPITAL_COMMUNITY): Payer: Self-pay | Admitting: *Deleted

## 2018-09-18 NOTE — Telephone Encounter (Signed)
Patient given detailed instructions per Myocardial Perfusion Study Information Sheet for the test on 09/26/18. Patient notified to arrive 15 minutes early and that it is imperative to arrive on time for appointment to keep from having the test rescheduled.  If you need to cancel or reschedule your appointment, please call the office within 24 hours of your appointment. . Patient verbalized understanding.Alexandra Richardson Jacqueline    

## 2018-09-21 ENCOUNTER — Ambulatory Visit (INDEPENDENT_AMBULATORY_CARE_PROVIDER_SITE_OTHER): Payer: Medicare Other

## 2018-09-21 ENCOUNTER — Other Ambulatory Visit: Payer: Self-pay

## 2018-09-21 DIAGNOSIS — R011 Cardiac murmur, unspecified: Secondary | ICD-10-CM | POA: Diagnosis not present

## 2018-09-21 NOTE — Progress Notes (Signed)
Complete echocardiogram has been performed.  Jimmy Selenne Coggin RDCS, RVT 

## 2018-09-27 ENCOUNTER — Telehealth: Payer: Self-pay

## 2018-09-27 NOTE — Telephone Encounter (Signed)
-----   Message from Rajan R Revankar, MD sent at 09/25/2018 12:07 PM EDT ----- The results of the study is unremarkable. Please inform patient. I will discuss in detail at next appointment. Cc  primary care/referring physician Rajan R Revankar, MD 09/25/2018 12:07 PM 

## 2018-09-27 NOTE — Telephone Encounter (Signed)
Results relayed, no further questions. Copy sent to DR. Lam per DR. Docia Furl request.

## 2018-10-24 ENCOUNTER — Telehealth: Payer: Self-pay | Admitting: *Deleted

## 2018-10-24 NOTE — Telephone Encounter (Signed)
Patient given detailed instructions per Myocardial Perfusion Study Information Sheet for the test on 10/30/18. Patient notified to arrive 15 minutes early and that it is imperative to arrive on time for appointment to keep from having the test rescheduled.  If you need to cancel or reschedule your appointment, please call the office within 24 hours of your appointment. . Patient verbalized understanding Alexandra Richardson    

## 2018-11-23 DIAGNOSIS — Z23 Encounter for immunization: Secondary | ICD-10-CM | POA: Diagnosis not present

## 2018-11-28 ENCOUNTER — Telehealth (HOSPITAL_COMMUNITY): Payer: Self-pay | Admitting: *Deleted

## 2018-11-28 NOTE — Telephone Encounter (Signed)
Patient given detailed instructions per Myocardial Perfusion Study Information Sheet for the test on 12/04/18. Patient notified to arrive 15 minutes early and that it is imperative to arrive on time for appointment to keep from having the test rescheduled.  If you need to cancel or reschedule your appointment, please call the office within 24 hours of your appointment. . Patient verbalized understanding. Alexandra Richardson Jacqueline    

## 2018-12-04 ENCOUNTER — Ambulatory Visit (INDEPENDENT_AMBULATORY_CARE_PROVIDER_SITE_OTHER): Payer: Medicare Other

## 2018-12-04 ENCOUNTER — Other Ambulatory Visit: Payer: Self-pay

## 2018-12-04 DIAGNOSIS — I2510 Atherosclerotic heart disease of native coronary artery without angina pectoris: Secondary | ICD-10-CM

## 2018-12-04 LAB — MYOCARDIAL PERFUSION IMAGING
LV dias vol: 43 mL (ref 46–106)
LV sys vol: 11 mL
Peak HR: 110 {beats}/min
Rest HR: 98 {beats}/min
SDS: 0
SRS: 0
SSS: 0
TID: 1.25

## 2018-12-04 MED ORDER — REGADENOSON 0.4 MG/5ML IV SOLN
0.4000 mg | Freq: Once | INTRAVENOUS | Status: AC
  Administered 2018-12-04: 0.4 mg via INTRAVENOUS

## 2018-12-04 MED ORDER — TECHNETIUM TC 99M TETROFOSMIN IV KIT
32.4000 | PACK | Freq: Once | INTRAVENOUS | Status: AC | PRN
  Administered 2018-12-04: 32.4 via INTRAVENOUS

## 2018-12-04 MED ORDER — TECHNETIUM TC 99M TETROFOSMIN IV KIT
9.6000 | PACK | Freq: Once | INTRAVENOUS | Status: AC | PRN
  Administered 2018-12-04: 9.6 via INTRAVENOUS

## 2018-12-06 ENCOUNTER — Telehealth: Payer: Self-pay

## 2018-12-06 NOTE — Telephone Encounter (Signed)
-----   Message from Jenean Lindau, MD sent at 12/05/2018  8:50 AM EDT ----- The results of the study is unremarkable. Please inform patient. I will discuss in detail at next appointment. Cc  primary care/referring physician Jenean Lindau, MD 12/05/2018 8:50 AM

## 2018-12-06 NOTE — Telephone Encounter (Signed)
Results relayed, copy sent to Dr. Chauncy Passy

## 2018-12-07 ENCOUNTER — Ambulatory Visit: Payer: Medicare Other | Admitting: Cardiology

## 2018-12-10 ENCOUNTER — Other Ambulatory Visit: Payer: Self-pay

## 2018-12-10 ENCOUNTER — Ambulatory Visit (INDEPENDENT_AMBULATORY_CARE_PROVIDER_SITE_OTHER): Payer: Medicare Other | Admitting: Cardiology

## 2018-12-10 ENCOUNTER — Encounter: Payer: Self-pay | Admitting: Cardiology

## 2018-12-10 VITALS — BP 142/78 | HR 113 | Ht 63.0 in | Wt 183.6 lb

## 2018-12-10 DIAGNOSIS — F17200 Nicotine dependence, unspecified, uncomplicated: Secondary | ICD-10-CM

## 2018-12-10 DIAGNOSIS — E782 Mixed hyperlipidemia: Secondary | ICD-10-CM | POA: Diagnosis not present

## 2018-12-10 DIAGNOSIS — I10 Essential (primary) hypertension: Secondary | ICD-10-CM | POA: Diagnosis not present

## 2018-12-10 DIAGNOSIS — R0789 Other chest pain: Secondary | ICD-10-CM | POA: Insufficient documentation

## 2018-12-10 DIAGNOSIS — F17210 Nicotine dependence, cigarettes, uncomplicated: Secondary | ICD-10-CM

## 2018-12-10 MED ORDER — METOPROLOL SUCCINATE ER 25 MG PO TB24: 25.0000 mg | ORAL_TABLET | Freq: Every day | ORAL | 3 refills | Status: DC

## 2018-12-10 NOTE — Patient Instructions (Signed)
Medication Instructions:  Your physician has recommended you make the following change in your medication:   START taking metoprolol succinate 25 mg (1 tablet) once daily *If you need a refill on your cardiac medications before your next appointment, please call your pharmacy*  Lab Work: NONE  If you have labs (blood work) drawn today and your tests are completely normal, you will receive your results only by: Marland Kitchen MyChart Message (if you have MyChart) OR . A paper copy in the mail If you have any lab test that is abnormal or we need to change your treatment, we will call you to review the results.  Testing/Procedures: You had an EKG performed today  Follow-Up: At [facility], you and your health needs are our priority.  As part of our continuing mission to provide you with exceptional heart care, we have created designated Provider Care Teams.  These Care Teams include your primary Cardiologist (physician) and Advanced Practice Providers (APPs -  Physician Assistants and Nurse Practitioners) who all work together to provide you with the care you need, when you need it.  Your next appointment:   6 months  The format for your next appointment:   In Person  Provider:   Belva Crome, MD  Other Instructions Metoprolol tablets What is this medicine? METOPROLOL (me TOE proe lole) is a beta-blocker. Beta-blockers reduce the workload on the heart and help it to beat more regularly. This medicine is used to treat high blood pressure and to prevent chest pain. It is also used to after a heart attack and to prevent an additional heart attack from occurring. This medicine may be used for other purposes; ask your health care provider or pharmacist if you have questions. COMMON BRAND NAME(S): Lopressor What should I tell my health care provider before I take this medicine? They need to know if you have any of these conditions:  diabetes  heart or vessel disease like slow heart rate,  worsening heart failure, heart block, sick sinus syndrome or Raynaud's disease  kidney disease  liver disease  lung or breathing disease, like asthma or emphysema  pheochromocytoma  thyroid disease  an unusual or allergic reaction to metoprolol, other beta-blockers, medicines, foods, dyes, or preservatives  pregnant or trying to get pregnant  breast-feeding How should I use this medicine? Take this medicine by mouth with a drink of water. Follow the directions on the prescription label. Take this medicine immediately after meals. Take your doses at regular intervals. Do not take more medicine than directed. Do not stop taking this medicine suddenly. This could lead to serious heart-related effects. Talk to your pediatrician regarding the use of this medicine in children. Special care may be needed. Overdosage: If you think you have taken too much of this medicine contact a poison control center or emergency room at once. NOTE: This medicine is only for you. Do not share this medicine with others. What if I miss a dose? If you miss a dose, take it as soon as you can. If it is almost time for your next dose, take only that dose. Do not take double or extra doses. What may interact with this medicine? This medicine may interact with the following medications:  certain medicines for blood pressure, heart disease, irregular heart beat  certain medicines for depression like monoamine oxidase (MAO) inhibitors, fluoxetine, or paroxetine  clonidine  dobutamine  epinephrine  isoproterenol  reserpine This list may not describe all possible interactions. Give your health care provider a  list of all the medicines, herbs, non-prescription drugs, or dietary supplements you use. Also tell them if you smoke, drink alcohol, or use illegal drugs. Some items may interact with your medicine. What should I watch for while using this medicine? Visit your doctor or health care professional for  regular check ups. Contact your doctor right away if your symptoms worsen. Check your blood pressure and pulse rate regularly. Ask your health care professional what your blood pressure and pulse rate should be, and when you should contact them. You may get drowsy or dizzy. Do not drive, use machinery, or do anything that needs mental alertness until you know how this medicine affects you. Do not sit or stand up quickly, especially if you are an older patient. This reduces the risk of dizzy or fainting spells. Contact your doctor if these symptoms continue. Alcohol may interfere with the effect of this medicine. Avoid alcoholic drinks. This medicine may increase blood sugar. Ask your healthcare provider if changes in diet or medicines are needed if you have diabetes. What side effects may I notice from receiving this medicine? Side effects that you should report to your doctor or health care professional as soon as possible:  allergic reactions like skin rash, itching or hives  cold or numb hands or feet  depression  difficulty breathing  faint  fever with sore throat  irregular heartbeat, chest pain  rapid weight gain   signs and symptoms of high blood sugar such as being more thirsty or hungry or having to urinate more than normal. You may also feel very tired or have blurry vision.  swollen legs or ankles Side effects that usually do not require medical attention (report to your doctor or health care professional if they continue or are bothersome):  anxiety or nervousness  change in sex drive or performance  dry skin  headache  nightmares or trouble sleeping  short term memory loss  stomach upset or diarrhea This list may not describe all possible side effects. Call your doctor for medical advice about side effects. You may report side effects to FDA at 1-800-FDA-1088. Where should I keep my medicine? Keep out of the reach of children. Store at room temperature between  15 and 30 degrees C (59 and 86 degrees F). Throw away any unused medicine after the expiration date. NOTE: This sheet is a summary. It may not cover all possible information. If you have questions about this medicine, talk to your doctor, pharmacist, or health care provider.  2020 Elsevier/Gold Standard (2017-11-14 11:15:23)

## 2018-12-10 NOTE — Progress Notes (Signed)
Cardiology Office Note:    Date:  12/10/2018   ID:  Alexandra Richardson, DOB 11-10-1950, MRN [id number]  PCP:  Philmore Pali, NP  Cardiologist:  Jenean Lindau, MD   Referring MD: Philmore Pali, NP    ASSESSMENT:    1. Tobacco dependence   2. Essential hypertension   3. Mixed hyperlipidemia   4. Chest discomfort    PLAN:    In order of problems listed above:  1. Primary prevention stressed to the patient.  Importance of compliance with diet and medication stressed and she vocalized understanding. 2. Essential hypertension: Her blood pressure stable 3. Mixed dyslipidemia: Diet was discussed lipids are followed by primary care physician. 4. Tobacco dependence: I spent 5 minutes with the patient discussing solely about smoking. Smoking cessation was counseled. I suggested to the patient also different medications and pharmacological interventions. Patient is keen to try stopping on its own at this time. He will get back to me if he needs any further assistance in this matter. 5. She will be seen in follow-up appointment on a as needed basis only.   Medication Adjustments/Labs and Tests Ordered: Current medicines are reviewed at length with the patient today.  Concerns regarding medicines are outlined above.  Orders Placed This Encounter  Procedures  . EKG 12-Lead   Meds ordered this encounter  Medications  . metoprolol succinate (TOPROL-XL) 25 MG 24 hr tablet    Sig: Take 1 tablet (25 mg total) by mouth daily. Take with or immediately following a meal.    Dispense:  30 tablet    Refill:  3     No chief complaint on file.    History of Present Illness:    Alexandra Richardson is a 68 y.o. female.  She was evaluated by me for chest discomfort.  Subsequently she is done fine.  No chest pain orthopnea or PND.  Unfortunately she continues to smoke.  She history of essential hypertension and dyslipidemia.  At the time of my evaluation, the patient is alert awake oriented and in no  distress.  Past Medical History:  Diagnosis Date  . Anxiety   . Anxiety   . Arthritis   . Depression   . Grief 07/03/2011  . Hyperlipidemia   . Hypertension     Past Surgical History:  Procedure Laterality Date  . CHOLECYSTECTOMY      Current Medications: Current Meds  Medication Sig  . amitriptyline (ELAVIL) 150 MG tablet Take 150 mg by mouth at bedtime.  Marland Kitchen aspirin EC 81 MG tablet Take 81 mg by mouth daily.  Marland Kitchen atorvastatin (LIPITOR) 80 MG tablet Take 80 mg by mouth daily.   . busPIRone (BUSPAR) 15 MG tablet Take 5 mg by mouth 2 (two) times daily.   . celecoxib (CELEBREX) 200 MG capsule Take 200 mg by mouth 2 (two) times daily as needed for mild pain or moderate pain.  . fluticasone (FLONASE) 50 MCG/ACT nasal spray Place 2 sprays into both nostrils daily.  Marland Kitchen gabapentin (NEURONTIN) 600 MG tablet Take 1 tablet (600 mg total) by mouth at bedtime. (Patient taking differently: Take 600 mg by mouth 3 (three) times daily. )  . Multiple Vitamins-Minerals (CENTRUM SILVER PO) Take by mouth.  . risperiDONE (RISPERDAL) 2 MG tablet Take 2 mg by mouth 2 (two) times daily.  Marland Kitchen tiotropium (SPIRIVA) 18 MCG inhalation capsule Place 1 capsule (18 mcg total) into inhaler and inhale daily.     Allergies:   Bee venom and  Codeine   Social History   Socioeconomic History  . Marital status: Legally Separated    Spouse name: Not on file  . Number of children: Not on file  . Years of education: Not on file  . Highest education level: Not on file  Occupational History  . Occupation: retired  Engineer, production  . Financial resource strain: Not on file  . Food insecurity    Worry: Not on file    Inability: Not on file  . Transportation needs    Medical: Not on file    Non-medical: Not on file  Tobacco Use  . Smoking status: Current Every Day Smoker    Packs/day: 1.00    Years: 40.00    Pack years: 40.00    Types: Cigarettes  . Smokeless tobacco: Never Used  Substance and Sexual Activity  .  Alcohol use: Yes    Comment: occasionally  . Drug use: No  . Sexual activity: Not on file  Lifestyle  . Physical activity    Days per week: Not on file    Minutes per session: Not on file  . Stress: Not on file  Relationships  . Social Musician on phone: Not on file    Gets together: Not on file    Attends religious service: Not on file    Active member of club or organization: Not on file    Attends meetings of clubs or organizations: Not on file    Relationship status: Not on file  Other Topics Concern  . Not on file  Social History Narrative  . Not on file     Family History: The patient's family history includes Coronary artery disease in her father; Diabetes type II in her sister; Hypertension in her mother. There is no history of Breast cancer.  ROS:   Please see the history of present illness.    All other systems reviewed and are negative.  EKGs/Labs/Other Studies Reviewed:    The following studies were reviewed today: I discussed findings of testing done on the patient at extensive length   Recent Labs: 07/19/2018: ALT 23; BUN 9; Creatinine, Ser 0.81; Hemoglobin 15.3; Platelets 198; Potassium 4.6; Sodium 133; TSH 1.951  Recent Lipid Panel    Component Value Date/Time   CHOL  11/19/2008 2136    68        ATP III CLASSIFICATION:  <200     mg/dL   Desirable  161-096  mg/dL   Borderline High  >=045    mg/dL   High          TRIG 409 (H) 02/10/2013 2214   HDL 25 (L) 11/19/2008 2136   CHOLHDL 2.7 11/19/2008 2136   VLDL 21 11/19/2008 2136   LDLCALC  11/19/2008 2136    22        Total Cholesterol/HDL:CHD Risk Coronary Heart Disease Risk Table                     Men   Women  1/2 Average Risk   3.4   3.3  Average Risk       5.0   4.4  2 X Average Risk   9.6   7.1  3 X Average Risk  23.4   11.0        Use the calculated Patient Ratio above and the CHD Risk Table to determine the patient's CHD Risk.        ATP III CLASSIFICATION (LDL):  <  100      mg/dL   Optimal  562-130100-129  mg/dL   Near or Above                    Optimal  130-159  mg/dL   Borderline  [id number]  mg/dL   High  >696>190     mg/dL   Very High    Physical Exam:    VS:  BP (!) 142/78 (BP Location: Left Arm, Patient Position: Sitting, Cuff Size: Normal)   Pulse (!) 113   Ht 5\' 3"  (1.6 m)   Wt 183 lb 9.6 oz (83.3 kg)   SpO2 93%   BMI 32.52 kg/m     Wt Readings from Last 3 Encounters:  12/10/18 183 lb 9.6 oz (83.3 kg)  12/04/18 185 lb (83.9 kg)  08/06/18 185 lb (83.9 kg)     GEN: Patient is in no acute distress HEENT: Normal NECK: No JVD; No carotid bruits LYMPHATICS: No lymphadenopathy CARDIAC: Hear sounds regular, 2/6 systolic murmur at the apex. RESPIRATORY:  Clear to auscultation without rales, wheezing or rhonchi  ABDOMEN: Soft, non-tender, non-distended MUSCULOSKELETAL:  No edema; No deformity  SKIN: Warm and dry NEUROLOGIC:  Alert and oriented x 3 PSYCHIATRIC:  Normal affect   Signed, Garwin Brothersajan R Manya Balash, MD  12/10/2018 1:32 PM    [facility]

## 2018-12-20 DIAGNOSIS — Z0000 Encounter for general adult medical examination without abnormal findings: Secondary | ICD-10-CM | POA: Diagnosis not present

## 2018-12-20 DIAGNOSIS — Z9181 History of falling: Secondary | ICD-10-CM | POA: Diagnosis not present

## 2018-12-20 DIAGNOSIS — E785 Hyperlipidemia, unspecified: Secondary | ICD-10-CM | POA: Diagnosis not present

## 2019-01-18 DIAGNOSIS — E782 Mixed hyperlipidemia: Secondary | ICD-10-CM | POA: Diagnosis not present

## 2019-01-18 DIAGNOSIS — R000 Tachycardia, unspecified: Secondary | ICD-10-CM | POA: Diagnosis not present

## 2019-01-18 DIAGNOSIS — I10 Essential (primary) hypertension: Secondary | ICD-10-CM | POA: Diagnosis not present

## 2019-01-18 DIAGNOSIS — Z23 Encounter for immunization: Secondary | ICD-10-CM | POA: Diagnosis not present

## 2019-01-18 DIAGNOSIS — Z79899 Other long term (current) drug therapy: Secondary | ICD-10-CM | POA: Diagnosis not present

## 2019-01-18 DIAGNOSIS — E1129 Type 2 diabetes mellitus with other diabetic kidney complication: Secondary | ICD-10-CM | POA: Diagnosis not present

## 2019-01-18 DIAGNOSIS — R809 Proteinuria, unspecified: Secondary | ICD-10-CM | POA: Diagnosis not present

## 2019-01-18 DIAGNOSIS — E559 Vitamin D deficiency, unspecified: Secondary | ICD-10-CM | POA: Diagnosis not present

## 2019-01-21 DIAGNOSIS — R0902 Hypoxemia: Secondary | ICD-10-CM | POA: Diagnosis not present

## 2019-01-21 DIAGNOSIS — R4781 Slurred speech: Secondary | ICD-10-CM | POA: Diagnosis not present

## 2019-01-21 DIAGNOSIS — Z7984 Long term (current) use of oral hypoglycemic drugs: Secondary | ICD-10-CM | POA: Diagnosis not present

## 2019-01-21 DIAGNOSIS — R55 Syncope and collapse: Secondary | ICD-10-CM

## 2019-01-21 DIAGNOSIS — R42 Dizziness and giddiness: Secondary | ICD-10-CM | POA: Diagnosis not present

## 2019-01-21 DIAGNOSIS — E871 Hypo-osmolality and hyponatremia: Secondary | ICD-10-CM

## 2019-01-21 DIAGNOSIS — D72829 Elevated white blood cell count, unspecified: Secondary | ICD-10-CM

## 2019-01-21 DIAGNOSIS — R571 Hypovolemic shock: Secondary | ICD-10-CM | POA: Diagnosis not present

## 2019-01-21 DIAGNOSIS — E7800 Pure hypercholesterolemia, unspecified: Secondary | ICD-10-CM | POA: Diagnosis not present

## 2019-01-21 DIAGNOSIS — R6510 Systemic inflammatory response syndrome (SIRS) of non-infectious origin without acute organ dysfunction: Secondary | ICD-10-CM | POA: Diagnosis not present

## 2019-01-21 DIAGNOSIS — Z03818 Encounter for observation for suspected exposure to other biological agents ruled out: Secondary | ICD-10-CM | POA: Diagnosis not present

## 2019-01-21 DIAGNOSIS — R0602 Shortness of breath: Secondary | ICD-10-CM | POA: Diagnosis not present

## 2019-01-21 DIAGNOSIS — J479 Bronchiectasis, uncomplicated: Secondary | ICD-10-CM | POA: Diagnosis not present

## 2019-01-21 DIAGNOSIS — N390 Urinary tract infection, site not specified: Secondary | ICD-10-CM | POA: Diagnosis not present

## 2019-01-21 DIAGNOSIS — E1169 Type 2 diabetes mellitus with other specified complication: Secondary | ICD-10-CM | POA: Diagnosis not present

## 2019-01-21 DIAGNOSIS — Z743 Need for continuous supervision: Secondary | ICD-10-CM | POA: Diagnosis not present

## 2019-01-21 DIAGNOSIS — I959 Hypotension, unspecified: Secondary | ICD-10-CM

## 2019-01-21 DIAGNOSIS — I10 Essential (primary) hypertension: Secondary | ICD-10-CM | POA: Diagnosis not present

## 2019-01-21 DIAGNOSIS — R231 Pallor: Secondary | ICD-10-CM | POA: Diagnosis not present

## 2019-01-21 DIAGNOSIS — E1165 Type 2 diabetes mellitus with hyperglycemia: Secondary | ICD-10-CM | POA: Diagnosis not present

## 2019-01-21 DIAGNOSIS — I951 Orthostatic hypotension: Secondary | ICD-10-CM | POA: Diagnosis not present

## 2019-01-21 DIAGNOSIS — A419 Sepsis, unspecified organism: Secondary | ICD-10-CM

## 2019-01-21 DIAGNOSIS — R531 Weakness: Secondary | ICD-10-CM | POA: Diagnosis not present

## 2019-01-21 DIAGNOSIS — R112 Nausea with vomiting, unspecified: Secondary | ICD-10-CM

## 2019-01-21 DIAGNOSIS — Z885 Allergy status to narcotic agent status: Secondary | ICD-10-CM | POA: Diagnosis not present

## 2019-01-22 DIAGNOSIS — D72829 Elevated white blood cell count, unspecified: Secondary | ICD-10-CM | POA: Diagnosis not present

## 2019-01-22 DIAGNOSIS — I959 Hypotension, unspecified: Secondary | ICD-10-CM | POA: Diagnosis not present

## 2019-01-22 DIAGNOSIS — E1165 Type 2 diabetes mellitus with hyperglycemia: Secondary | ICD-10-CM | POA: Diagnosis not present

## 2019-01-22 DIAGNOSIS — R55 Syncope and collapse: Secondary | ICD-10-CM | POA: Diagnosis not present

## 2019-01-22 DIAGNOSIS — A419 Sepsis, unspecified organism: Secondary | ICD-10-CM | POA: Diagnosis not present

## 2019-01-29 DIAGNOSIS — I959 Hypotension, unspecified: Secondary | ICD-10-CM | POA: Diagnosis not present

## 2019-01-29 DIAGNOSIS — E860 Dehydration: Secondary | ICD-10-CM | POA: Diagnosis not present

## 2019-01-29 DIAGNOSIS — R55 Syncope and collapse: Secondary | ICD-10-CM | POA: Diagnosis not present

## 2019-01-29 DIAGNOSIS — J219 Acute bronchiolitis, unspecified: Secondary | ICD-10-CM | POA: Diagnosis not present

## 2019-01-29 DIAGNOSIS — Z79899 Other long term (current) drug therapy: Secondary | ICD-10-CM | POA: Diagnosis not present

## 2019-03-06 ENCOUNTER — Other Ambulatory Visit: Payer: Self-pay | Admitting: Nurse Practitioner

## 2019-03-06 DIAGNOSIS — Z1231 Encounter for screening mammogram for malignant neoplasm of breast: Secondary | ICD-10-CM

## 2019-04-01 ENCOUNTER — Other Ambulatory Visit: Payer: Self-pay

## 2019-04-01 NOTE — Patient Outreach (Signed)
[facility]) Care Management  04/01/2019  [name] 1950/06/15 [id number]   Medication Adherence call to Jerold Coombe [facility] Identifiers Verify spoke with patient she is past due on Atorvastatin 80 mg,Metformin 500 mg and Ramipril 10 mg,patient explain she receives a mail order avery first of the month and has already receive for this month.patient,patient has plenty until the end of the month.  Lillia Abed CPhT Pharmacy Technician [facility] Management Direct Dial [phone]  Fax [phone] [email or web address]

## 2019-04-04 DIAGNOSIS — M5440 Lumbago with sciatica, unspecified side: Secondary | ICD-10-CM | POA: Diagnosis not present

## 2019-04-12 ENCOUNTER — Ambulatory Visit
Admission: RE | Admit: 2019-04-12 | Discharge: 2019-04-12 | Disposition: A | Discharge status: Home / Self Care | Payer: Medicare Other | Source: Ambulatory Visit | Attending: Nurse Practitioner | Admitting: Nurse Practitioner

## 2019-04-12 ENCOUNTER — Other Ambulatory Visit: Payer: Self-pay

## 2019-04-12 DIAGNOSIS — Z1231 Encounter for screening mammogram for malignant neoplasm of breast: Secondary | ICD-10-CM | POA: Diagnosis not present

## 2019-04-15 DIAGNOSIS — M5440 Lumbago with sciatica, unspecified side: Secondary | ICD-10-CM | POA: Diagnosis not present

## 2019-04-15 DIAGNOSIS — M545 Low back pain: Secondary | ICD-10-CM | POA: Diagnosis not present

## 2019-04-22 DIAGNOSIS — R809 Proteinuria, unspecified: Secondary | ICD-10-CM | POA: Diagnosis not present

## 2019-04-22 DIAGNOSIS — E1129 Type 2 diabetes mellitus with other diabetic kidney complication: Secondary | ICD-10-CM | POA: Diagnosis not present

## 2019-04-22 DIAGNOSIS — I10 Essential (primary) hypertension: Secondary | ICD-10-CM | POA: Diagnosis not present

## 2019-05-07 DIAGNOSIS — M4726 Other spondylosis with radiculopathy, lumbar region: Secondary | ICD-10-CM | POA: Diagnosis not present

## 2019-05-07 DIAGNOSIS — M48062 Spinal stenosis, lumbar region with neurogenic claudication: Secondary | ICD-10-CM | POA: Diagnosis not present

## 2019-05-07 DIAGNOSIS — M545 Low back pain: Secondary | ICD-10-CM | POA: Diagnosis not present

## 2019-05-07 DIAGNOSIS — M5116 Intervertebral disc disorders with radiculopathy, lumbar region: Secondary | ICD-10-CM | POA: Diagnosis not present

## 2019-05-07 DIAGNOSIS — M4716 Other spondylosis with myelopathy, lumbar region: Secondary | ICD-10-CM | POA: Diagnosis not present

## 2019-05-09 DIAGNOSIS — M4316 Spondylolisthesis, lumbar region: Secondary | ICD-10-CM | POA: Diagnosis not present

## 2019-05-09 DIAGNOSIS — M5116 Intervertebral disc disorders with radiculopathy, lumbar region: Secondary | ICD-10-CM | POA: Diagnosis not present

## 2019-05-09 DIAGNOSIS — M545 Low back pain: Secondary | ICD-10-CM | POA: Diagnosis not present

## 2019-05-09 DIAGNOSIS — Z01812 Encounter for preprocedural laboratory examination: Secondary | ICD-10-CM | POA: Diagnosis not present

## 2019-05-09 DIAGNOSIS — M4726 Other spondylosis with radiculopathy, lumbar region: Secondary | ICD-10-CM | POA: Diagnosis not present

## 2019-05-09 DIAGNOSIS — R8290 Unspecified abnormal findings in urine: Secondary | ICD-10-CM | POA: Diagnosis not present

## 2019-05-09 DIAGNOSIS — M48062 Spinal stenosis, lumbar region with neurogenic claudication: Secondary | ICD-10-CM | POA: Diagnosis not present

## 2019-05-09 DIAGNOSIS — M4721 Other spondylosis with radiculopathy, occipito-atlanto-axial region: Secondary | ICD-10-CM | POA: Diagnosis not present

## 2019-05-09 DIAGNOSIS — Z20822 Contact with and (suspected) exposure to covid-19: Secondary | ICD-10-CM | POA: Diagnosis not present

## 2019-05-09 DIAGNOSIS — Z4689 Encounter for fitting and adjustment of other specified devices: Secondary | ICD-10-CM | POA: Diagnosis not present

## 2019-05-15 DIAGNOSIS — M5116 Intervertebral disc disorders with radiculopathy, lumbar region: Secondary | ICD-10-CM | POA: Diagnosis not present

## 2019-05-15 DIAGNOSIS — Z7984 Long term (current) use of oral hypoglycemic drugs: Secondary | ICD-10-CM | POA: Diagnosis not present

## 2019-05-15 DIAGNOSIS — M4316 Spondylolisthesis, lumbar region: Secondary | ICD-10-CM | POA: Diagnosis not present

## 2019-05-15 DIAGNOSIS — M48062 Spinal stenosis, lumbar region with neurogenic claudication: Secondary | ICD-10-CM | POA: Diagnosis not present

## 2019-05-15 DIAGNOSIS — Z981 Arthrodesis status: Secondary | ICD-10-CM | POA: Diagnosis not present

## 2019-05-15 DIAGNOSIS — Z7982 Long term (current) use of aspirin: Secondary | ICD-10-CM | POA: Diagnosis not present

## 2019-05-15 DIAGNOSIS — J449 Chronic obstructive pulmonary disease, unspecified: Secondary | ICD-10-CM | POA: Diagnosis not present

## 2019-05-15 DIAGNOSIS — M4326 Fusion of spine, lumbar region: Secondary | ICD-10-CM | POA: Diagnosis not present

## 2019-05-15 DIAGNOSIS — M545 Low back pain: Secondary | ICD-10-CM | POA: Diagnosis not present

## 2019-05-15 DIAGNOSIS — Z79899 Other long term (current) drug therapy: Secondary | ICD-10-CM | POA: Diagnosis not present

## 2019-05-15 DIAGNOSIS — I10 Essential (primary) hypertension: Secondary | ICD-10-CM | POA: Diagnosis not present

## 2019-05-15 DIAGNOSIS — E119 Type 2 diabetes mellitus without complications: Secondary | ICD-10-CM | POA: Diagnosis not present

## 2019-05-15 DIAGNOSIS — M5126 Other intervertebral disc displacement, lumbar region: Secondary | ICD-10-CM | POA: Diagnosis not present

## 2019-05-15 DIAGNOSIS — M4726 Other spondylosis with radiculopathy, lumbar region: Secondary | ICD-10-CM | POA: Diagnosis not present

## 2019-05-16 DIAGNOSIS — J449 Chronic obstructive pulmonary disease, unspecified: Secondary | ICD-10-CM | POA: Diagnosis not present

## 2019-05-16 DIAGNOSIS — M48062 Spinal stenosis, lumbar region with neurogenic claudication: Secondary | ICD-10-CM | POA: Diagnosis not present

## 2019-05-16 DIAGNOSIS — Z7982 Long term (current) use of aspirin: Secondary | ICD-10-CM | POA: Diagnosis not present

## 2019-05-16 DIAGNOSIS — E119 Type 2 diabetes mellitus without complications: Secondary | ICD-10-CM | POA: Diagnosis not present

## 2019-05-16 DIAGNOSIS — Z79899 Other long term (current) drug therapy: Secondary | ICD-10-CM | POA: Diagnosis not present

## 2019-05-16 DIAGNOSIS — M5106 Intervertebral disc disorders with myelopathy, lumbar region: Secondary | ICD-10-CM | POA: Insufficient documentation

## 2019-05-16 DIAGNOSIS — M5116 Intervertebral disc disorders with radiculopathy, lumbar region: Secondary | ICD-10-CM | POA: Diagnosis not present

## 2019-05-16 DIAGNOSIS — M4726 Other spondylosis with radiculopathy, lumbar region: Secondary | ICD-10-CM | POA: Diagnosis not present

## 2019-05-16 DIAGNOSIS — Z7984 Long term (current) use of oral hypoglycemic drugs: Secondary | ICD-10-CM | POA: Diagnosis not present

## 2019-05-16 DIAGNOSIS — I10 Essential (primary) hypertension: Secondary | ICD-10-CM | POA: Diagnosis not present

## 2019-05-16 DIAGNOSIS — R2681 Unsteadiness on feet: Secondary | ICD-10-CM | POA: Diagnosis not present

## 2019-05-16 DIAGNOSIS — M4316 Spondylolisthesis, lumbar region: Secondary | ICD-10-CM | POA: Diagnosis not present

## 2019-05-17 DIAGNOSIS — E119 Type 2 diabetes mellitus without complications: Secondary | ICD-10-CM | POA: Diagnosis not present

## 2019-05-17 DIAGNOSIS — M4316 Spondylolisthesis, lumbar region: Secondary | ICD-10-CM | POA: Diagnosis not present

## 2019-05-17 DIAGNOSIS — Z7984 Long term (current) use of oral hypoglycemic drugs: Secondary | ICD-10-CM | POA: Diagnosis not present

## 2019-05-17 DIAGNOSIS — Z7982 Long term (current) use of aspirin: Secondary | ICD-10-CM | POA: Diagnosis not present

## 2019-05-17 DIAGNOSIS — M5116 Intervertebral disc disorders with radiculopathy, lumbar region: Secondary | ICD-10-CM | POA: Diagnosis not present

## 2019-05-17 DIAGNOSIS — Z79899 Other long term (current) drug therapy: Secondary | ICD-10-CM | POA: Diagnosis not present

## 2019-05-17 DIAGNOSIS — Z981 Arthrodesis status: Secondary | ICD-10-CM | POA: Diagnosis not present

## 2019-05-17 DIAGNOSIS — I10 Essential (primary) hypertension: Secondary | ICD-10-CM | POA: Diagnosis not present

## 2019-05-17 DIAGNOSIS — J449 Chronic obstructive pulmonary disease, unspecified: Secondary | ICD-10-CM | POA: Diagnosis not present

## 2019-05-17 DIAGNOSIS — M48062 Spinal stenosis, lumbar region with neurogenic claudication: Secondary | ICD-10-CM | POA: Diagnosis not present

## 2019-05-17 DIAGNOSIS — M4726 Other spondylosis with radiculopathy, lumbar region: Secondary | ICD-10-CM | POA: Diagnosis not present

## 2019-05-17 DIAGNOSIS — M4326 Fusion of spine, lumbar region: Secondary | ICD-10-CM | POA: Diagnosis not present

## 2019-05-21 DIAGNOSIS — E119 Type 2 diabetes mellitus without complications: Secondary | ICD-10-CM | POA: Diagnosis not present

## 2019-05-21 DIAGNOSIS — Z981 Arthrodesis status: Secondary | ICD-10-CM | POA: Diagnosis not present

## 2019-05-21 DIAGNOSIS — Z4789 Encounter for other orthopedic aftercare: Secondary | ICD-10-CM | POA: Diagnosis not present

## 2019-05-21 DIAGNOSIS — M47816 Spondylosis without myelopathy or radiculopathy, lumbar region: Secondary | ICD-10-CM | POA: Diagnosis not present

## 2019-05-21 DIAGNOSIS — E785 Hyperlipidemia, unspecified: Secondary | ICD-10-CM | POA: Diagnosis not present

## 2019-05-21 DIAGNOSIS — J449 Chronic obstructive pulmonary disease, unspecified: Secondary | ICD-10-CM | POA: Diagnosis not present

## 2019-05-21 DIAGNOSIS — I10 Essential (primary) hypertension: Secondary | ICD-10-CM | POA: Diagnosis not present

## 2019-05-21 DIAGNOSIS — Z7984 Long term (current) use of oral hypoglycemic drugs: Secondary | ICD-10-CM | POA: Diagnosis not present

## 2019-05-22 DIAGNOSIS — Z7984 Long term (current) use of oral hypoglycemic drugs: Secondary | ICD-10-CM | POA: Diagnosis not present

## 2019-05-22 DIAGNOSIS — E785 Hyperlipidemia, unspecified: Secondary | ICD-10-CM | POA: Diagnosis not present

## 2019-05-22 DIAGNOSIS — Z981 Arthrodesis status: Secondary | ICD-10-CM | POA: Diagnosis not present

## 2019-05-22 DIAGNOSIS — Z4789 Encounter for other orthopedic aftercare: Secondary | ICD-10-CM | POA: Diagnosis not present

## 2019-05-22 DIAGNOSIS — J449 Chronic obstructive pulmonary disease, unspecified: Secondary | ICD-10-CM | POA: Diagnosis not present

## 2019-05-22 DIAGNOSIS — I10 Essential (primary) hypertension: Secondary | ICD-10-CM | POA: Diagnosis not present

## 2019-05-22 DIAGNOSIS — M47816 Spondylosis without myelopathy or radiculopathy, lumbar region: Secondary | ICD-10-CM | POA: Diagnosis not present

## 2019-05-22 DIAGNOSIS — E119 Type 2 diabetes mellitus without complications: Secondary | ICD-10-CM | POA: Diagnosis not present

## 2019-05-27 DIAGNOSIS — Z4789 Encounter for other orthopedic aftercare: Secondary | ICD-10-CM | POA: Diagnosis not present

## 2019-05-27 DIAGNOSIS — J449 Chronic obstructive pulmonary disease, unspecified: Secondary | ICD-10-CM | POA: Diagnosis not present

## 2019-05-27 DIAGNOSIS — E785 Hyperlipidemia, unspecified: Secondary | ICD-10-CM | POA: Diagnosis not present

## 2019-05-27 DIAGNOSIS — Z981 Arthrodesis status: Secondary | ICD-10-CM | POA: Diagnosis not present

## 2019-05-27 DIAGNOSIS — I10 Essential (primary) hypertension: Secondary | ICD-10-CM | POA: Diagnosis not present

## 2019-05-27 DIAGNOSIS — M47816 Spondylosis without myelopathy or radiculopathy, lumbar region: Secondary | ICD-10-CM | POA: Diagnosis not present

## 2019-05-27 DIAGNOSIS — Z7984 Long term (current) use of oral hypoglycemic drugs: Secondary | ICD-10-CM | POA: Diagnosis not present

## 2019-05-27 DIAGNOSIS — E119 Type 2 diabetes mellitus without complications: Secondary | ICD-10-CM | POA: Diagnosis not present

## 2019-05-29 DIAGNOSIS — E785 Hyperlipidemia, unspecified: Secondary | ICD-10-CM | POA: Diagnosis not present

## 2019-05-29 DIAGNOSIS — J449 Chronic obstructive pulmonary disease, unspecified: Secondary | ICD-10-CM | POA: Diagnosis not present

## 2019-05-29 DIAGNOSIS — M47816 Spondylosis without myelopathy or radiculopathy, lumbar region: Secondary | ICD-10-CM | POA: Diagnosis not present

## 2019-05-29 DIAGNOSIS — I10 Essential (primary) hypertension: Secondary | ICD-10-CM | POA: Diagnosis not present

## 2019-05-29 DIAGNOSIS — Z4789 Encounter for other orthopedic aftercare: Secondary | ICD-10-CM | POA: Diagnosis not present

## 2019-05-29 DIAGNOSIS — E119 Type 2 diabetes mellitus without complications: Secondary | ICD-10-CM | POA: Diagnosis not present

## 2019-05-29 DIAGNOSIS — Z981 Arthrodesis status: Secondary | ICD-10-CM | POA: Diagnosis not present

## 2019-05-29 DIAGNOSIS — Z7984 Long term (current) use of oral hypoglycemic drugs: Secondary | ICD-10-CM | POA: Diagnosis not present

## 2019-06-04 DIAGNOSIS — E785 Hyperlipidemia, unspecified: Secondary | ICD-10-CM | POA: Diagnosis not present

## 2019-06-04 DIAGNOSIS — Z4789 Encounter for other orthopedic aftercare: Secondary | ICD-10-CM | POA: Diagnosis not present

## 2019-06-04 DIAGNOSIS — Z7984 Long term (current) use of oral hypoglycemic drugs: Secondary | ICD-10-CM | POA: Diagnosis not present

## 2019-06-04 DIAGNOSIS — Z981 Arthrodesis status: Secondary | ICD-10-CM | POA: Diagnosis not present

## 2019-06-04 DIAGNOSIS — J449 Chronic obstructive pulmonary disease, unspecified: Secondary | ICD-10-CM | POA: Diagnosis not present

## 2019-06-04 DIAGNOSIS — I10 Essential (primary) hypertension: Secondary | ICD-10-CM | POA: Diagnosis not present

## 2019-06-04 DIAGNOSIS — E119 Type 2 diabetes mellitus without complications: Secondary | ICD-10-CM | POA: Diagnosis not present

## 2019-06-04 DIAGNOSIS — M47816 Spondylosis without myelopathy or radiculopathy, lumbar region: Secondary | ICD-10-CM | POA: Diagnosis not present

## 2019-06-06 DIAGNOSIS — J449 Chronic obstructive pulmonary disease, unspecified: Secondary | ICD-10-CM | POA: Diagnosis not present

## 2019-06-06 DIAGNOSIS — E119 Type 2 diabetes mellitus without complications: Secondary | ICD-10-CM | POA: Diagnosis not present

## 2019-06-06 DIAGNOSIS — Z7984 Long term (current) use of oral hypoglycemic drugs: Secondary | ICD-10-CM | POA: Diagnosis not present

## 2019-06-06 DIAGNOSIS — Z981 Arthrodesis status: Secondary | ICD-10-CM | POA: Diagnosis not present

## 2019-06-06 DIAGNOSIS — M47816 Spondylosis without myelopathy or radiculopathy, lumbar region: Secondary | ICD-10-CM | POA: Diagnosis not present

## 2019-06-06 DIAGNOSIS — E785 Hyperlipidemia, unspecified: Secondary | ICD-10-CM | POA: Diagnosis not present

## 2019-06-06 DIAGNOSIS — I10 Essential (primary) hypertension: Secondary | ICD-10-CM | POA: Diagnosis not present

## 2019-06-06 DIAGNOSIS — Z4789 Encounter for other orthopedic aftercare: Secondary | ICD-10-CM | POA: Diagnosis not present

## 2019-06-18 DIAGNOSIS — M4726 Other spondylosis with radiculopathy, lumbar region: Secondary | ICD-10-CM | POA: Diagnosis not present

## 2019-07-23 DIAGNOSIS — I10 Essential (primary) hypertension: Secondary | ICD-10-CM | POA: Diagnosis not present

## 2019-07-23 DIAGNOSIS — E782 Mixed hyperlipidemia: Secondary | ICD-10-CM | POA: Diagnosis not present

## 2019-07-23 DIAGNOSIS — Z139 Encounter for screening, unspecified: Secondary | ICD-10-CM | POA: Diagnosis not present

## 2019-07-23 DIAGNOSIS — E1129 Type 2 diabetes mellitus with other diabetic kidney complication: Secondary | ICD-10-CM | POA: Diagnosis not present

## 2019-07-23 DIAGNOSIS — Z79899 Other long term (current) drug therapy: Secondary | ICD-10-CM | POA: Diagnosis not present

## 2019-07-23 DIAGNOSIS — R809 Proteinuria, unspecified: Secondary | ICD-10-CM | POA: Diagnosis not present

## 2019-07-23 DIAGNOSIS — E559 Vitamin D deficiency, unspecified: Secondary | ICD-10-CM | POA: Diagnosis not present

## 2019-08-20 DIAGNOSIS — M48062 Spinal stenosis, lumbar region with neurogenic claudication: Secondary | ICD-10-CM | POA: Diagnosis not present

## 2019-08-20 DIAGNOSIS — M4326 Fusion of spine, lumbar region: Secondary | ICD-10-CM | POA: Diagnosis not present

## 2019-08-20 DIAGNOSIS — M4316 Spondylolisthesis, lumbar region: Secondary | ICD-10-CM | POA: Diagnosis not present

## 2019-08-20 DIAGNOSIS — M5116 Intervertebral disc disorders with radiculopathy, lumbar region: Secondary | ICD-10-CM | POA: Diagnosis not present

## 2019-10-04 DIAGNOSIS — M5431 Sciatica, right side: Secondary | ICD-10-CM | POA: Diagnosis not present

## 2019-10-04 DIAGNOSIS — M5432 Sciatica, left side: Secondary | ICD-10-CM | POA: Diagnosis not present

## 2019-10-04 DIAGNOSIS — E1129 Type 2 diabetes mellitus with other diabetic kidney complication: Secondary | ICD-10-CM | POA: Diagnosis not present

## 2019-10-04 DIAGNOSIS — R809 Proteinuria, unspecified: Secondary | ICD-10-CM | POA: Diagnosis not present

## 2019-10-31 DIAGNOSIS — M5116 Intervertebral disc disorders with radiculopathy, lumbar region: Secondary | ICD-10-CM | POA: Diagnosis not present

## 2019-10-31 DIAGNOSIS — M4326 Fusion of spine, lumbar region: Secondary | ICD-10-CM | POA: Diagnosis not present

## 2019-10-31 DIAGNOSIS — M48062 Spinal stenosis, lumbar region with neurogenic claudication: Secondary | ICD-10-CM | POA: Diagnosis not present

## 2019-10-31 DIAGNOSIS — M4316 Spondylolisthesis, lumbar region: Secondary | ICD-10-CM | POA: Diagnosis not present

## 2019-12-02 DIAGNOSIS — M4326 Fusion of spine, lumbar region: Secondary | ICD-10-CM | POA: Diagnosis not present

## 2019-12-02 DIAGNOSIS — M5127 Other intervertebral disc displacement, lumbosacral region: Secondary | ICD-10-CM | POA: Diagnosis not present

## 2019-12-02 DIAGNOSIS — M545 Low back pain, unspecified: Secondary | ICD-10-CM | POA: Diagnosis not present

## 2019-12-03 DIAGNOSIS — M961 Postlaminectomy syndrome, not elsewhere classified: Secondary | ICD-10-CM | POA: Diagnosis not present

## 2019-12-03 DIAGNOSIS — M48062 Spinal stenosis, lumbar region with neurogenic claudication: Secondary | ICD-10-CM | POA: Diagnosis not present

## 2019-12-03 DIAGNOSIS — M4326 Fusion of spine, lumbar region: Secondary | ICD-10-CM | POA: Diagnosis not present

## 2019-12-03 DIAGNOSIS — M5116 Intervertebral disc disorders with radiculopathy, lumbar region: Secondary | ICD-10-CM | POA: Diagnosis not present

## 2019-12-12 IMAGING — CR DG CHEST 2V
2 series · 2 of 2 positions shown · non-contrast
Comparison: PA and lateral chest 02/22/2013 and 04/19/2010.

CLINICAL DATA: Weakness for 2-3 days.

EXAM:
CHEST - 2 VIEW

[w chest lat]
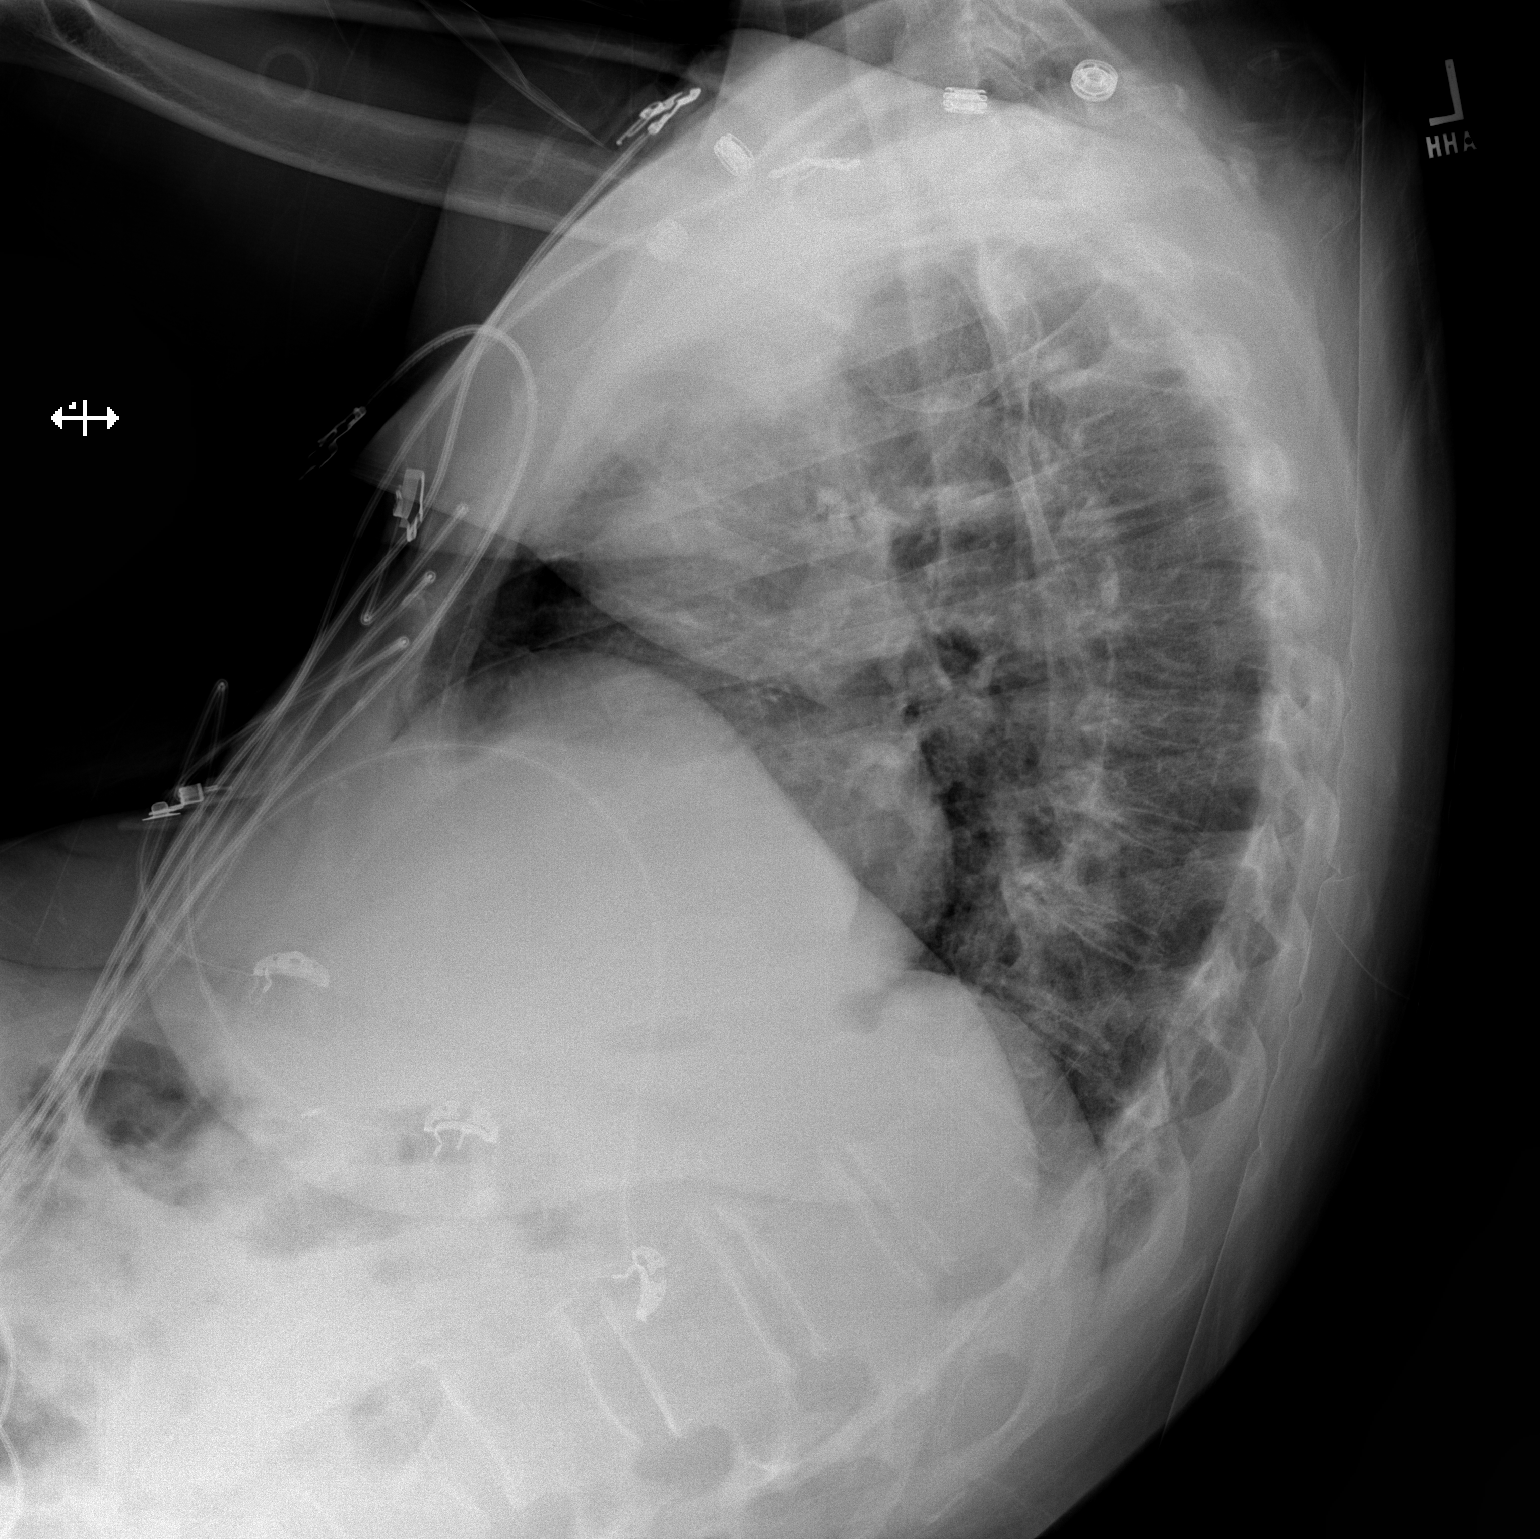

[x chest ap]
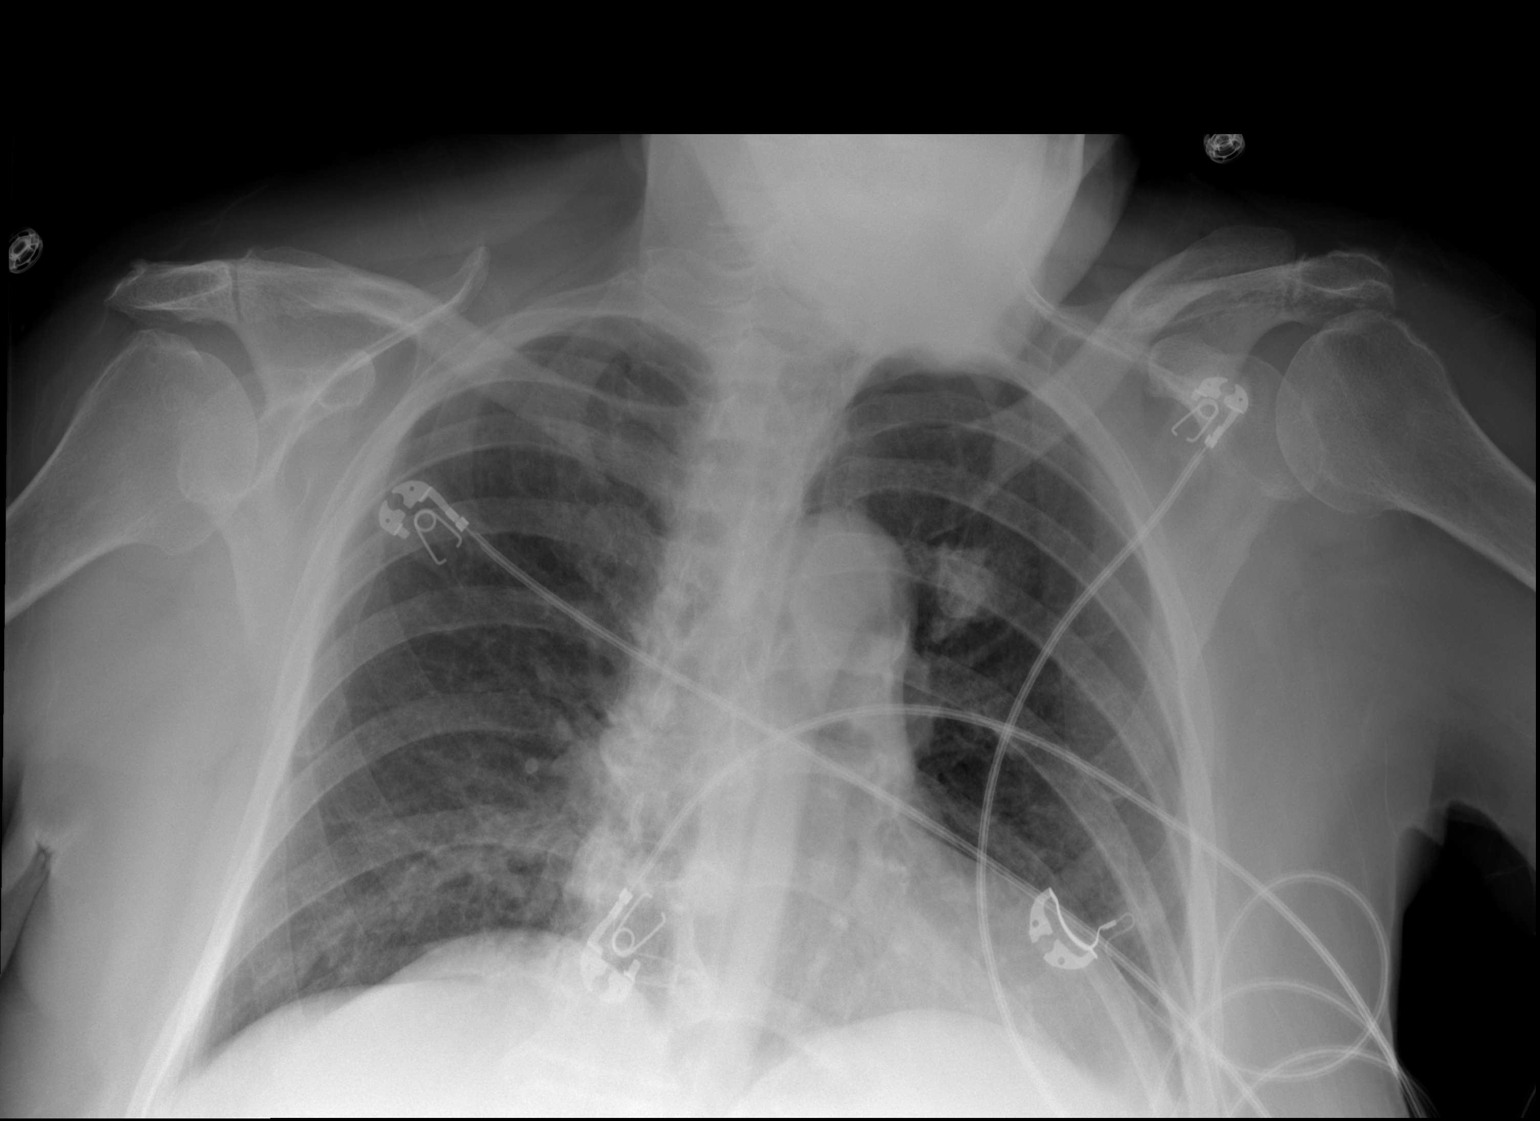

[2 of 2 positions shown; findings below may reference images not displayed]

FINDINGS: The lungs are clear. Heart size is normal. No pneumothorax or
pleural effusion. No acute bony abnormality.
IMPRESSION: No acute disease.

## 2019-12-12 IMAGING — CT CT HEAD W/O CM
3 series · 14 of 47 positions shown, 16 images · non-contrast
Comparison: Brain MRI 11/17/2009. Head CT without contrast
11/15/2009.

CLINICAL DATA: 66-year-old female with syncope versus presyncope at
home this morning.

EXAM:
CT HEAD WITHOUT CONTRAST
TECHNIQUE: Contiguous axial images were obtained from the base of the skull
through the vertex without intravenous contrast.

[Series 2: head wo · axial · 0.47mm/px · z∈[-150,-25]mm · 8 of 31 slices shown, 10 images]
[im 3/31  brain]
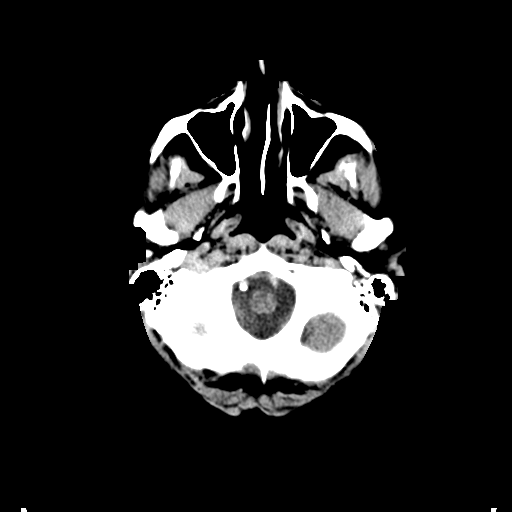
[im 3/31  bone]
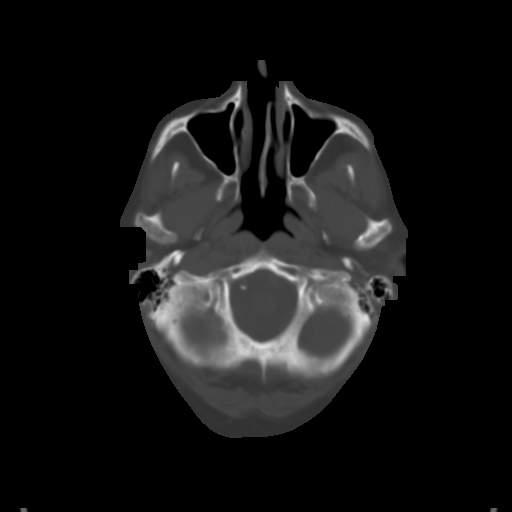
[im 7/31  brain]
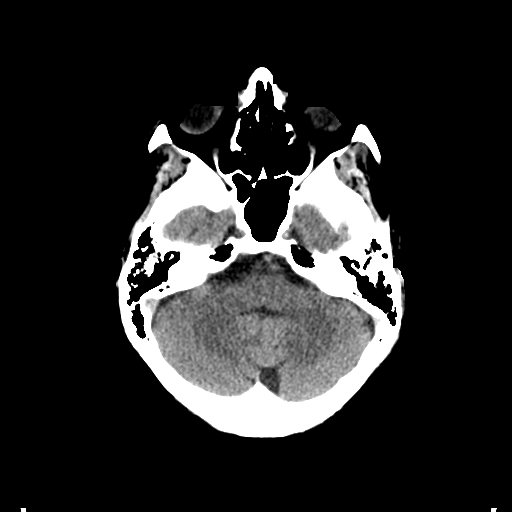
[im 10/31  brain]
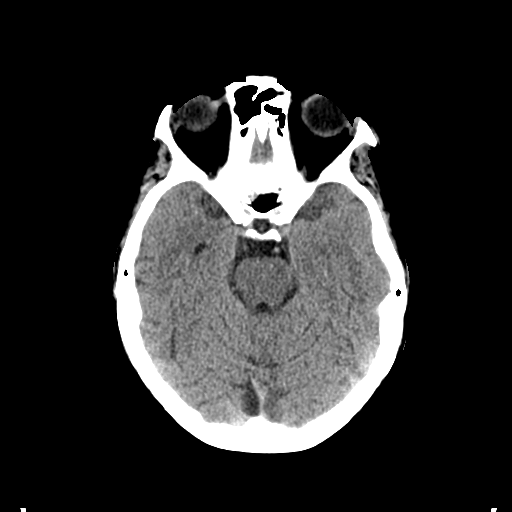
[im 14/31  brain]
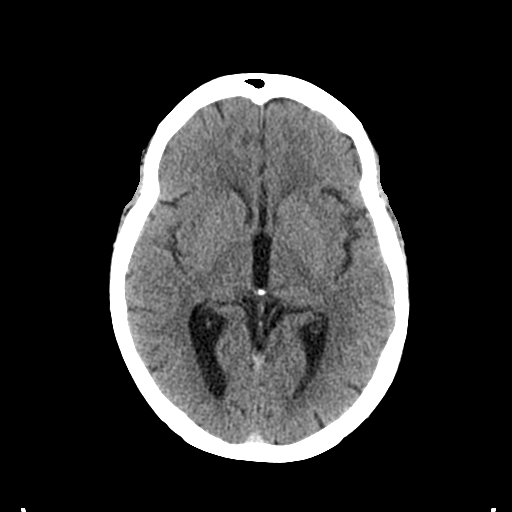
[im 17/31  brain]
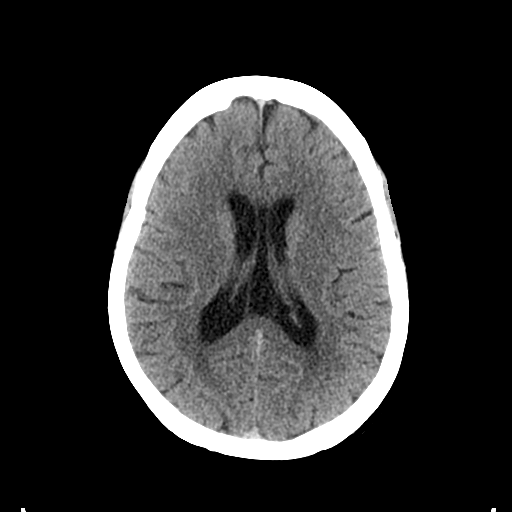
[im 17/31  bone]
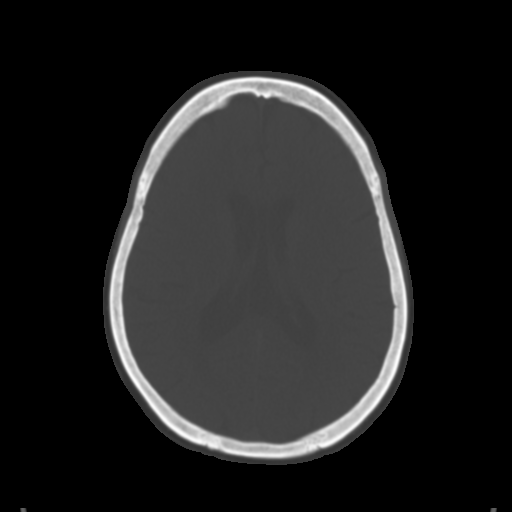
[im 21/31  brain]
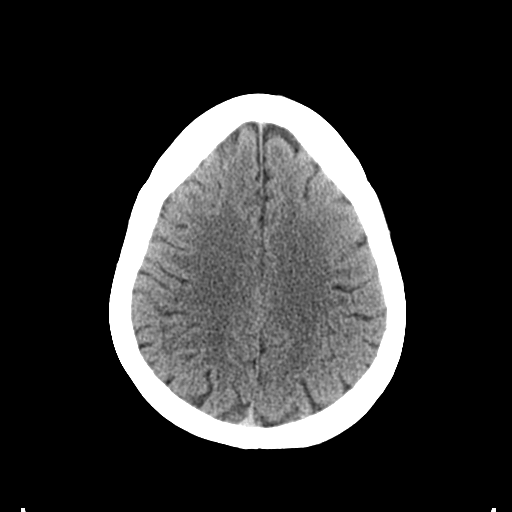
[im 24/31  brain]
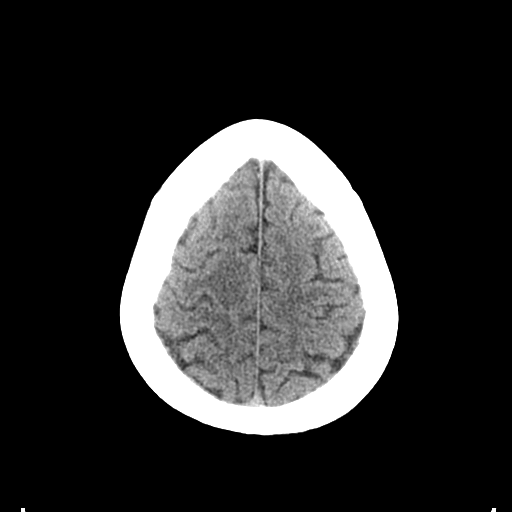
[im 28/31  brain]
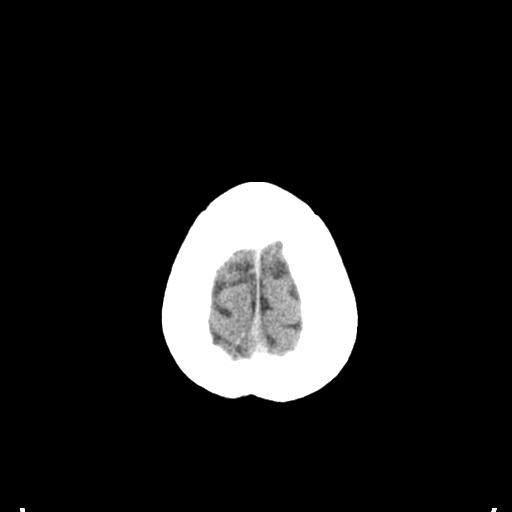

[Series 4: coronal soft tissue · coronal · 0.39mm/px · 3 of 62 slices shown]
[im 21/62  brain]
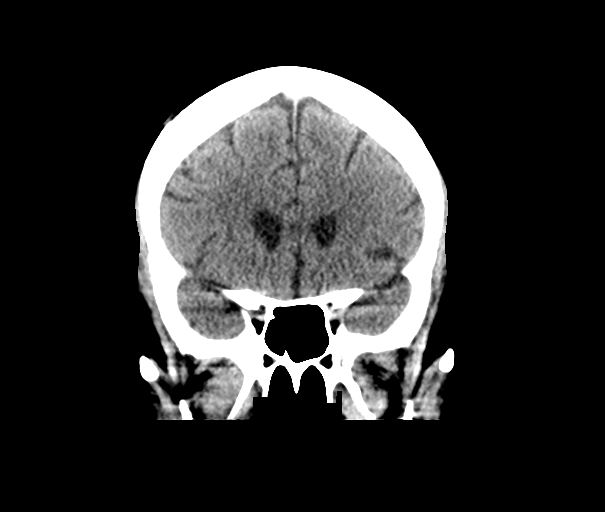
[im 28/62  brain]
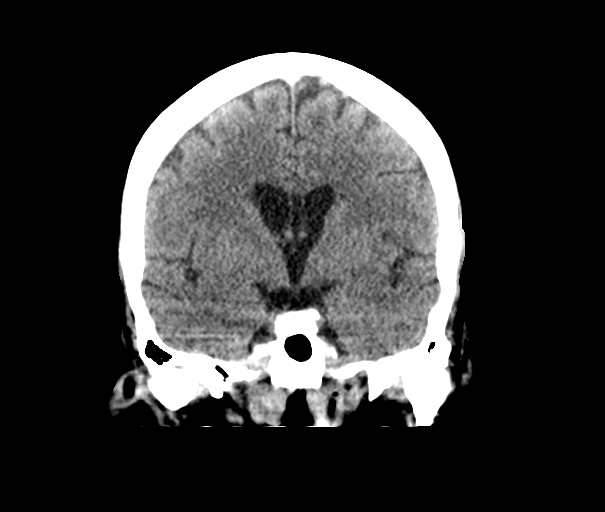
[im 34/62  brain]
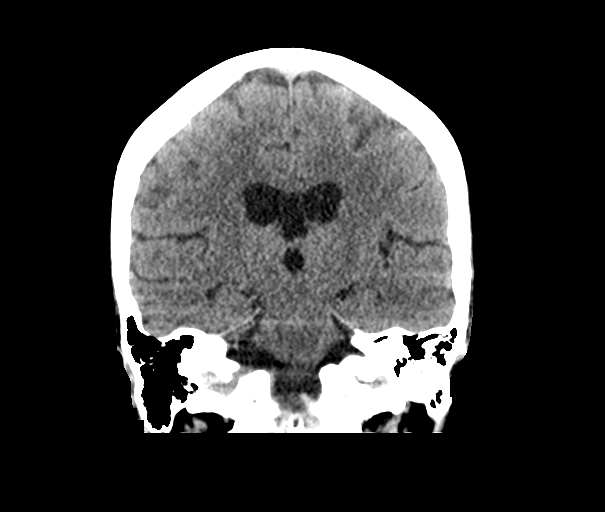

[Series 5: sagittal soft tissue · sagittal · 0.41mm/px · 3 of 53 slices shown]
[im 18/53  brain]
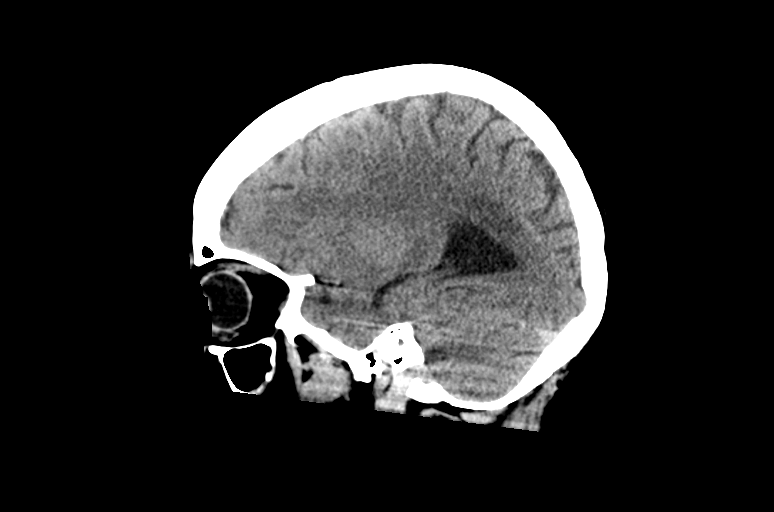
[im 27/53  brain]
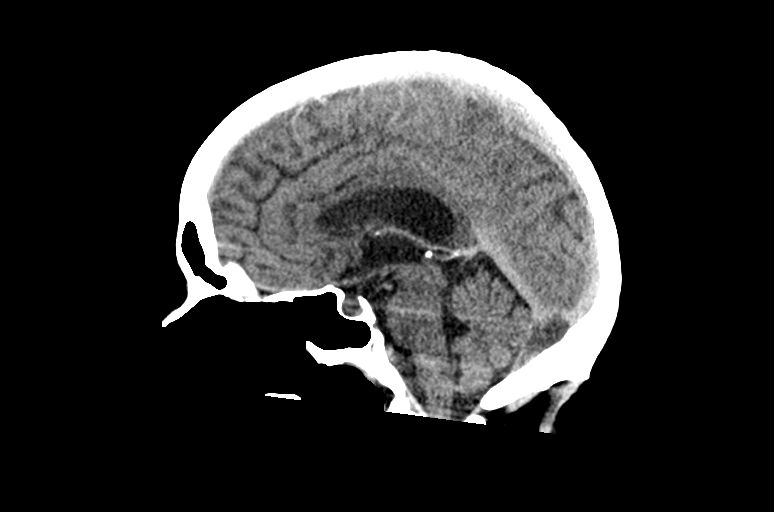
[im 35/53  brain]
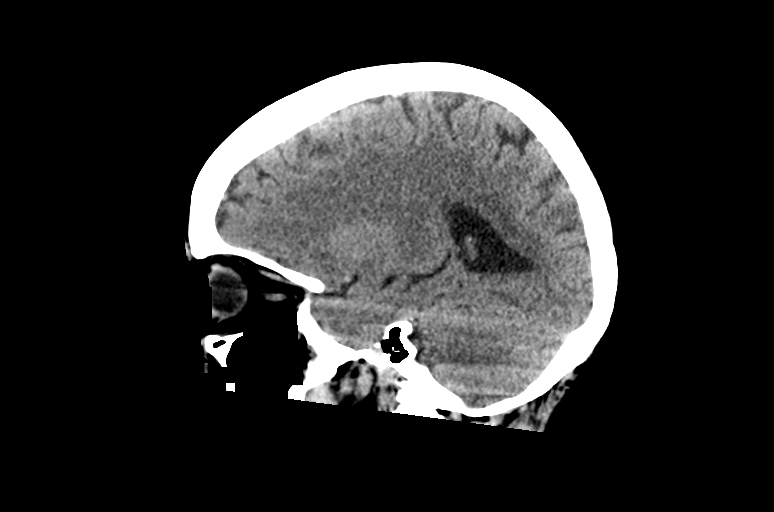

[14 of 47 positions shown; findings below may reference images not displayed]

FINDINGS: Brain: Cerebral volume remains normal for age. Cavum septum
pellucidum (normal variant). No midline shift, ventriculomegaly,
mass effect, evidence of mass lesion, intracranial hemorrhage or
evidence of cortically based acute infarction. Scattered mild for
age cerebral white matter hypodensity appears increased since 6588.
No cortical encephalomalacia identified.

Vascular: Calcified atherosclerosis at the skull base. No suspicious
intracranial vascular hyperdensity.

Skull: Stable and negative.

Sinuses/Orbits: Clear.

Other: Visualized orbit soft tissues are within normal limits.
Visualized scalp soft tissues are within normal limits.
IMPRESSION: 1.  No acute intracranial abnormality.
2. Mild for age nonspecific white matter changes appear progressed
since 6588, most commonly due to small vessel disease.

## 2019-12-17 DIAGNOSIS — E782 Mixed hyperlipidemia: Secondary | ICD-10-CM | POA: Diagnosis not present

## 2019-12-17 DIAGNOSIS — Z23 Encounter for immunization: Secondary | ICD-10-CM | POA: Diagnosis not present

## 2019-12-17 DIAGNOSIS — E559 Vitamin D deficiency, unspecified: Secondary | ICD-10-CM | POA: Diagnosis not present

## 2019-12-17 DIAGNOSIS — M79672 Pain in left foot: Secondary | ICD-10-CM | POA: Diagnosis not present

## 2019-12-17 DIAGNOSIS — I10 Essential (primary) hypertension: Secondary | ICD-10-CM | POA: Diagnosis not present

## 2019-12-17 DIAGNOSIS — R42 Dizziness and giddiness: Secondary | ICD-10-CM | POA: Diagnosis not present

## 2019-12-17 DIAGNOSIS — E1129 Type 2 diabetes mellitus with other diabetic kidney complication: Secondary | ICD-10-CM | POA: Diagnosis not present

## 2019-12-17 DIAGNOSIS — R809 Proteinuria, unspecified: Secondary | ICD-10-CM | POA: Diagnosis not present

## 2019-12-23 DIAGNOSIS — Z0000 Encounter for general adult medical examination without abnormal findings: Secondary | ICD-10-CM | POA: Diagnosis not present

## 2019-12-23 DIAGNOSIS — Z9181 History of falling: Secondary | ICD-10-CM | POA: Diagnosis not present

## 2019-12-23 DIAGNOSIS — E785 Hyperlipidemia, unspecified: Secondary | ICD-10-CM | POA: Diagnosis not present

## 2020-01-27 DIAGNOSIS — R262 Difficulty in walking, not elsewhere classified: Secondary | ICD-10-CM | POA: Diagnosis not present

## 2020-01-27 DIAGNOSIS — M6281 Muscle weakness (generalized): Secondary | ICD-10-CM | POA: Diagnosis not present

## 2020-01-27 DIAGNOSIS — M4326 Fusion of spine, lumbar region: Secondary | ICD-10-CM | POA: Diagnosis not present

## 2020-04-15 DIAGNOSIS — E1129 Type 2 diabetes mellitus with other diabetic kidney complication: Secondary | ICD-10-CM | POA: Diagnosis not present

## 2020-04-15 DIAGNOSIS — I700 Atherosclerosis of aorta: Secondary | ICD-10-CM | POA: Diagnosis not present

## 2020-04-15 DIAGNOSIS — J449 Chronic obstructive pulmonary disease, unspecified: Secondary | ICD-10-CM | POA: Diagnosis not present

## 2020-04-15 DIAGNOSIS — R29898 Other symptoms and signs involving the musculoskeletal system: Secondary | ICD-10-CM | POA: Diagnosis not present

## 2020-04-15 DIAGNOSIS — E782 Mixed hyperlipidemia: Secondary | ICD-10-CM | POA: Diagnosis not present

## 2020-04-15 DIAGNOSIS — R809 Proteinuria, unspecified: Secondary | ICD-10-CM | POA: Diagnosis not present

## 2020-04-15 DIAGNOSIS — E559 Vitamin D deficiency, unspecified: Secondary | ICD-10-CM | POA: Diagnosis not present

## 2020-04-15 DIAGNOSIS — I10 Essential (primary) hypertension: Secondary | ICD-10-CM | POA: Diagnosis not present

## 2020-05-01 DIAGNOSIS — R29898 Other symptoms and signs involving the musculoskeletal system: Secondary | ICD-10-CM | POA: Diagnosis not present

## 2020-05-01 DIAGNOSIS — J449 Chronic obstructive pulmonary disease, unspecified: Secondary | ICD-10-CM | POA: Diagnosis not present

## 2020-05-05 DIAGNOSIS — I700 Atherosclerosis of aorta: Secondary | ICD-10-CM | POA: Diagnosis not present

## 2020-05-05 DIAGNOSIS — E041 Nontoxic single thyroid nodule: Secondary | ICD-10-CM | POA: Diagnosis not present

## 2020-05-05 DIAGNOSIS — Z7984 Long term (current) use of oral hypoglycemic drugs: Secondary | ICD-10-CM | POA: Diagnosis not present

## 2020-05-05 DIAGNOSIS — J449 Chronic obstructive pulmonary disease, unspecified: Secondary | ICD-10-CM | POA: Diagnosis not present

## 2020-05-05 DIAGNOSIS — E782 Mixed hyperlipidemia: Secondary | ICD-10-CM | POA: Diagnosis not present

## 2020-05-05 DIAGNOSIS — E1140 Type 2 diabetes mellitus with diabetic neuropathy, unspecified: Secondary | ICD-10-CM | POA: Diagnosis not present

## 2020-05-05 DIAGNOSIS — Z9181 History of falling: Secondary | ICD-10-CM | POA: Diagnosis not present

## 2020-05-05 DIAGNOSIS — E559 Vitamin D deficiency, unspecified: Secondary | ICD-10-CM | POA: Diagnosis not present

## 2020-05-05 DIAGNOSIS — R809 Proteinuria, unspecified: Secondary | ICD-10-CM | POA: Diagnosis not present

## 2020-05-05 DIAGNOSIS — G43909 Migraine, unspecified, not intractable, without status migrainosus: Secondary | ICD-10-CM | POA: Diagnosis not present

## 2020-05-05 DIAGNOSIS — M4800 Spinal stenosis, site unspecified: Secondary | ICD-10-CM | POA: Diagnosis not present

## 2020-05-05 DIAGNOSIS — I10 Essential (primary) hypertension: Secondary | ICD-10-CM | POA: Diagnosis not present

## 2020-05-05 DIAGNOSIS — G4700 Insomnia, unspecified: Secondary | ICD-10-CM | POA: Diagnosis not present

## 2020-05-05 DIAGNOSIS — J3489 Other specified disorders of nose and nasal sinuses: Secondary | ICD-10-CM | POA: Diagnosis not present

## 2020-05-05 DIAGNOSIS — M1710 Unilateral primary osteoarthritis, unspecified knee: Secondary | ICD-10-CM | POA: Diagnosis not present

## 2020-05-05 DIAGNOSIS — E871 Hypo-osmolality and hyponatremia: Secondary | ICD-10-CM | POA: Diagnosis not present

## 2020-05-05 DIAGNOSIS — M5136 Other intervertebral disc degeneration, lumbar region: Secondary | ICD-10-CM | POA: Diagnosis not present

## 2020-05-05 DIAGNOSIS — E1129 Type 2 diabetes mellitus with other diabetic kidney complication: Secondary | ICD-10-CM | POA: Diagnosis not present

## 2020-05-05 DIAGNOSIS — Z7982 Long term (current) use of aspirin: Secondary | ICD-10-CM | POA: Diagnosis not present

## 2020-05-05 DIAGNOSIS — F17210 Nicotine dependence, cigarettes, uncomplicated: Secondary | ICD-10-CM | POA: Diagnosis not present

## 2020-05-06 DIAGNOSIS — E559 Vitamin D deficiency, unspecified: Secondary | ICD-10-CM | POA: Diagnosis not present

## 2020-05-06 DIAGNOSIS — J449 Chronic obstructive pulmonary disease, unspecified: Secondary | ICD-10-CM | POA: Diagnosis not present

## 2020-05-06 DIAGNOSIS — Z7984 Long term (current) use of oral hypoglycemic drugs: Secondary | ICD-10-CM | POA: Diagnosis not present

## 2020-05-06 DIAGNOSIS — F17210 Nicotine dependence, cigarettes, uncomplicated: Secondary | ICD-10-CM | POA: Diagnosis not present

## 2020-05-06 DIAGNOSIS — G43909 Migraine, unspecified, not intractable, without status migrainosus: Secondary | ICD-10-CM | POA: Diagnosis not present

## 2020-05-06 DIAGNOSIS — R809 Proteinuria, unspecified: Secondary | ICD-10-CM | POA: Diagnosis not present

## 2020-05-06 DIAGNOSIS — M4800 Spinal stenosis, site unspecified: Secondary | ICD-10-CM | POA: Diagnosis not present

## 2020-05-06 DIAGNOSIS — E1140 Type 2 diabetes mellitus with diabetic neuropathy, unspecified: Secondary | ICD-10-CM | POA: Diagnosis not present

## 2020-05-06 DIAGNOSIS — I10 Essential (primary) hypertension: Secondary | ICD-10-CM | POA: Diagnosis not present

## 2020-05-06 DIAGNOSIS — J3489 Other specified disorders of nose and nasal sinuses: Secondary | ICD-10-CM | POA: Diagnosis not present

## 2020-05-06 DIAGNOSIS — M1710 Unilateral primary osteoarthritis, unspecified knee: Secondary | ICD-10-CM | POA: Diagnosis not present

## 2020-05-06 DIAGNOSIS — E871 Hypo-osmolality and hyponatremia: Secondary | ICD-10-CM | POA: Diagnosis not present

## 2020-05-06 DIAGNOSIS — E1129 Type 2 diabetes mellitus with other diabetic kidney complication: Secondary | ICD-10-CM | POA: Diagnosis not present

## 2020-05-06 DIAGNOSIS — Z9181 History of falling: Secondary | ICD-10-CM | POA: Diagnosis not present

## 2020-05-06 DIAGNOSIS — Z7982 Long term (current) use of aspirin: Secondary | ICD-10-CM | POA: Diagnosis not present

## 2020-05-06 DIAGNOSIS — G4700 Insomnia, unspecified: Secondary | ICD-10-CM | POA: Diagnosis not present

## 2020-05-06 DIAGNOSIS — E041 Nontoxic single thyroid nodule: Secondary | ICD-10-CM | POA: Diagnosis not present

## 2020-05-06 DIAGNOSIS — I700 Atherosclerosis of aorta: Secondary | ICD-10-CM | POA: Diagnosis not present

## 2020-05-06 DIAGNOSIS — E782 Mixed hyperlipidemia: Secondary | ICD-10-CM | POA: Diagnosis not present

## 2020-05-06 DIAGNOSIS — M5136 Other intervertebral disc degeneration, lumbar region: Secondary | ICD-10-CM | POA: Diagnosis not present

## 2020-05-08 DIAGNOSIS — M1710 Unilateral primary osteoarthritis, unspecified knee: Secondary | ICD-10-CM | POA: Diagnosis not present

## 2020-05-08 DIAGNOSIS — Z7982 Long term (current) use of aspirin: Secondary | ICD-10-CM | POA: Diagnosis not present

## 2020-05-08 DIAGNOSIS — G43909 Migraine, unspecified, not intractable, without status migrainosus: Secondary | ICD-10-CM | POA: Diagnosis not present

## 2020-05-08 DIAGNOSIS — I700 Atherosclerosis of aorta: Secondary | ICD-10-CM | POA: Diagnosis not present

## 2020-05-08 DIAGNOSIS — K219 Gastro-esophageal reflux disease without esophagitis: Secondary | ICD-10-CM | POA: Diagnosis not present

## 2020-05-08 DIAGNOSIS — R809 Proteinuria, unspecified: Secondary | ICD-10-CM | POA: Diagnosis not present

## 2020-05-08 DIAGNOSIS — J449 Chronic obstructive pulmonary disease, unspecified: Secondary | ICD-10-CM | POA: Diagnosis not present

## 2020-05-08 DIAGNOSIS — M5136 Other intervertebral disc degeneration, lumbar region: Secondary | ICD-10-CM | POA: Diagnosis not present

## 2020-05-08 DIAGNOSIS — E559 Vitamin D deficiency, unspecified: Secondary | ICD-10-CM | POA: Diagnosis not present

## 2020-05-08 DIAGNOSIS — E041 Nontoxic single thyroid nodule: Secondary | ICD-10-CM | POA: Diagnosis not present

## 2020-05-08 DIAGNOSIS — Z7984 Long term (current) use of oral hypoglycemic drugs: Secondary | ICD-10-CM | POA: Diagnosis not present

## 2020-05-08 DIAGNOSIS — G4700 Insomnia, unspecified: Secondary | ICD-10-CM | POA: Diagnosis not present

## 2020-05-08 DIAGNOSIS — E782 Mixed hyperlipidemia: Secondary | ICD-10-CM | POA: Diagnosis not present

## 2020-05-08 DIAGNOSIS — Z9181 History of falling: Secondary | ICD-10-CM | POA: Diagnosis not present

## 2020-05-08 DIAGNOSIS — E1129 Type 2 diabetes mellitus with other diabetic kidney complication: Secondary | ICD-10-CM | POA: Diagnosis not present

## 2020-05-08 DIAGNOSIS — E1140 Type 2 diabetes mellitus with diabetic neuropathy, unspecified: Secondary | ICD-10-CM | POA: Diagnosis not present

## 2020-05-08 DIAGNOSIS — M4800 Spinal stenosis, site unspecified: Secondary | ICD-10-CM | POA: Diagnosis not present

## 2020-05-08 DIAGNOSIS — M47819 Spondylosis without myelopathy or radiculopathy, site unspecified: Secondary | ICD-10-CM | POA: Diagnosis not present

## 2020-05-08 DIAGNOSIS — J3489 Other specified disorders of nose and nasal sinuses: Secondary | ICD-10-CM | POA: Diagnosis not present

## 2020-05-08 DIAGNOSIS — E871 Hypo-osmolality and hyponatremia: Secondary | ICD-10-CM | POA: Diagnosis not present

## 2020-05-08 DIAGNOSIS — F17210 Nicotine dependence, cigarettes, uncomplicated: Secondary | ICD-10-CM | POA: Diagnosis not present

## 2020-05-08 DIAGNOSIS — I10 Essential (primary) hypertension: Secondary | ICD-10-CM | POA: Diagnosis not present

## 2020-05-12 DIAGNOSIS — Z9181 History of falling: Secondary | ICD-10-CM | POA: Diagnosis not present

## 2020-05-12 DIAGNOSIS — J449 Chronic obstructive pulmonary disease, unspecified: Secondary | ICD-10-CM | POA: Diagnosis not present

## 2020-05-12 DIAGNOSIS — I10 Essential (primary) hypertension: Secondary | ICD-10-CM | POA: Diagnosis not present

## 2020-05-12 DIAGNOSIS — M47819 Spondylosis without myelopathy or radiculopathy, site unspecified: Secondary | ICD-10-CM | POA: Diagnosis not present

## 2020-05-12 DIAGNOSIS — E871 Hypo-osmolality and hyponatremia: Secondary | ICD-10-CM | POA: Diagnosis not present

## 2020-05-12 DIAGNOSIS — E1129 Type 2 diabetes mellitus with other diabetic kidney complication: Secondary | ICD-10-CM | POA: Diagnosis not present

## 2020-05-12 DIAGNOSIS — F17210 Nicotine dependence, cigarettes, uncomplicated: Secondary | ICD-10-CM | POA: Diagnosis not present

## 2020-05-12 DIAGNOSIS — I700 Atherosclerosis of aorta: Secondary | ICD-10-CM | POA: Diagnosis not present

## 2020-05-12 DIAGNOSIS — M4800 Spinal stenosis, site unspecified: Secondary | ICD-10-CM | POA: Diagnosis not present

## 2020-05-12 DIAGNOSIS — R809 Proteinuria, unspecified: Secondary | ICD-10-CM | POA: Diagnosis not present

## 2020-05-12 DIAGNOSIS — E559 Vitamin D deficiency, unspecified: Secondary | ICD-10-CM | POA: Diagnosis not present

## 2020-05-12 DIAGNOSIS — E1140 Type 2 diabetes mellitus with diabetic neuropathy, unspecified: Secondary | ICD-10-CM | POA: Diagnosis not present

## 2020-05-12 DIAGNOSIS — J3489 Other specified disorders of nose and nasal sinuses: Secondary | ICD-10-CM | POA: Diagnosis not present

## 2020-05-12 DIAGNOSIS — K219 Gastro-esophageal reflux disease without esophagitis: Secondary | ICD-10-CM | POA: Diagnosis not present

## 2020-05-12 DIAGNOSIS — G43909 Migraine, unspecified, not intractable, without status migrainosus: Secondary | ICD-10-CM | POA: Diagnosis not present

## 2020-05-12 DIAGNOSIS — E041 Nontoxic single thyroid nodule: Secondary | ICD-10-CM | POA: Diagnosis not present

## 2020-05-12 DIAGNOSIS — E782 Mixed hyperlipidemia: Secondary | ICD-10-CM | POA: Diagnosis not present

## 2020-05-12 DIAGNOSIS — M5136 Other intervertebral disc degeneration, lumbar region: Secondary | ICD-10-CM | POA: Diagnosis not present

## 2020-05-12 DIAGNOSIS — G4700 Insomnia, unspecified: Secondary | ICD-10-CM | POA: Diagnosis not present

## 2020-05-12 DIAGNOSIS — Z7982 Long term (current) use of aspirin: Secondary | ICD-10-CM | POA: Diagnosis not present

## 2020-05-12 DIAGNOSIS — M1710 Unilateral primary osteoarthritis, unspecified knee: Secondary | ICD-10-CM | POA: Diagnosis not present

## 2020-05-12 DIAGNOSIS — Z7984 Long term (current) use of oral hypoglycemic drugs: Secondary | ICD-10-CM | POA: Diagnosis not present

## 2020-05-14 DIAGNOSIS — E559 Vitamin D deficiency, unspecified: Secondary | ICD-10-CM | POA: Diagnosis not present

## 2020-05-14 DIAGNOSIS — I700 Atherosclerosis of aorta: Secondary | ICD-10-CM | POA: Diagnosis not present

## 2020-05-14 DIAGNOSIS — E782 Mixed hyperlipidemia: Secondary | ICD-10-CM | POA: Diagnosis not present

## 2020-05-14 DIAGNOSIS — Z7984 Long term (current) use of oral hypoglycemic drugs: Secondary | ICD-10-CM | POA: Diagnosis not present

## 2020-05-14 DIAGNOSIS — J449 Chronic obstructive pulmonary disease, unspecified: Secondary | ICD-10-CM | POA: Diagnosis not present

## 2020-05-14 DIAGNOSIS — E1129 Type 2 diabetes mellitus with other diabetic kidney complication: Secondary | ICD-10-CM | POA: Diagnosis not present

## 2020-05-14 DIAGNOSIS — G43909 Migraine, unspecified, not intractable, without status migrainosus: Secondary | ICD-10-CM | POA: Diagnosis not present

## 2020-05-14 DIAGNOSIS — K219 Gastro-esophageal reflux disease without esophagitis: Secondary | ICD-10-CM | POA: Diagnosis not present

## 2020-05-14 DIAGNOSIS — R809 Proteinuria, unspecified: Secondary | ICD-10-CM | POA: Diagnosis not present

## 2020-05-14 DIAGNOSIS — Z9181 History of falling: Secondary | ICD-10-CM | POA: Diagnosis not present

## 2020-05-14 DIAGNOSIS — G4700 Insomnia, unspecified: Secondary | ICD-10-CM | POA: Diagnosis not present

## 2020-05-14 DIAGNOSIS — M5136 Other intervertebral disc degeneration, lumbar region: Secondary | ICD-10-CM | POA: Diagnosis not present

## 2020-05-14 DIAGNOSIS — E871 Hypo-osmolality and hyponatremia: Secondary | ICD-10-CM | POA: Diagnosis not present

## 2020-05-14 DIAGNOSIS — E041 Nontoxic single thyroid nodule: Secondary | ICD-10-CM | POA: Diagnosis not present

## 2020-05-14 DIAGNOSIS — M4800 Spinal stenosis, site unspecified: Secondary | ICD-10-CM | POA: Diagnosis not present

## 2020-05-14 DIAGNOSIS — Z7982 Long term (current) use of aspirin: Secondary | ICD-10-CM | POA: Diagnosis not present

## 2020-05-14 DIAGNOSIS — J3489 Other specified disorders of nose and nasal sinuses: Secondary | ICD-10-CM | POA: Diagnosis not present

## 2020-05-14 DIAGNOSIS — M47819 Spondylosis without myelopathy or radiculopathy, site unspecified: Secondary | ICD-10-CM | POA: Diagnosis not present

## 2020-05-14 DIAGNOSIS — F17210 Nicotine dependence, cigarettes, uncomplicated: Secondary | ICD-10-CM | POA: Diagnosis not present

## 2020-05-14 DIAGNOSIS — M1710 Unilateral primary osteoarthritis, unspecified knee: Secondary | ICD-10-CM | POA: Diagnosis not present

## 2020-05-14 DIAGNOSIS — E1140 Type 2 diabetes mellitus with diabetic neuropathy, unspecified: Secondary | ICD-10-CM | POA: Diagnosis not present

## 2020-05-14 DIAGNOSIS — I10 Essential (primary) hypertension: Secondary | ICD-10-CM | POA: Diagnosis not present

## 2020-05-15 DIAGNOSIS — G4700 Insomnia, unspecified: Secondary | ICD-10-CM | POA: Diagnosis not present

## 2020-05-15 DIAGNOSIS — Z7984 Long term (current) use of oral hypoglycemic drugs: Secondary | ICD-10-CM | POA: Diagnosis not present

## 2020-05-15 DIAGNOSIS — M4800 Spinal stenosis, site unspecified: Secondary | ICD-10-CM | POA: Diagnosis not present

## 2020-05-15 DIAGNOSIS — G43909 Migraine, unspecified, not intractable, without status migrainosus: Secondary | ICD-10-CM | POA: Diagnosis not present

## 2020-05-15 DIAGNOSIS — M47819 Spondylosis without myelopathy or radiculopathy, site unspecified: Secondary | ICD-10-CM | POA: Diagnosis not present

## 2020-05-15 DIAGNOSIS — I700 Atherosclerosis of aorta: Secondary | ICD-10-CM | POA: Diagnosis not present

## 2020-05-15 DIAGNOSIS — Z9181 History of falling: Secondary | ICD-10-CM | POA: Diagnosis not present

## 2020-05-15 DIAGNOSIS — M1710 Unilateral primary osteoarthritis, unspecified knee: Secondary | ICD-10-CM | POA: Diagnosis not present

## 2020-05-15 DIAGNOSIS — E1129 Type 2 diabetes mellitus with other diabetic kidney complication: Secondary | ICD-10-CM | POA: Diagnosis not present

## 2020-05-15 DIAGNOSIS — E782 Mixed hyperlipidemia: Secondary | ICD-10-CM | POA: Diagnosis not present

## 2020-05-15 DIAGNOSIS — E041 Nontoxic single thyroid nodule: Secondary | ICD-10-CM | POA: Diagnosis not present

## 2020-05-15 DIAGNOSIS — K219 Gastro-esophageal reflux disease without esophagitis: Secondary | ICD-10-CM | POA: Diagnosis not present

## 2020-05-15 DIAGNOSIS — E871 Hypo-osmolality and hyponatremia: Secondary | ICD-10-CM | POA: Diagnosis not present

## 2020-05-15 DIAGNOSIS — J3489 Other specified disorders of nose and nasal sinuses: Secondary | ICD-10-CM | POA: Diagnosis not present

## 2020-05-15 DIAGNOSIS — E1140 Type 2 diabetes mellitus with diabetic neuropathy, unspecified: Secondary | ICD-10-CM | POA: Diagnosis not present

## 2020-05-15 DIAGNOSIS — F17210 Nicotine dependence, cigarettes, uncomplicated: Secondary | ICD-10-CM | POA: Diagnosis not present

## 2020-05-15 DIAGNOSIS — J449 Chronic obstructive pulmonary disease, unspecified: Secondary | ICD-10-CM | POA: Diagnosis not present

## 2020-05-15 DIAGNOSIS — I10 Essential (primary) hypertension: Secondary | ICD-10-CM | POA: Diagnosis not present

## 2020-05-15 DIAGNOSIS — Z7982 Long term (current) use of aspirin: Secondary | ICD-10-CM | POA: Diagnosis not present

## 2020-05-15 DIAGNOSIS — M5136 Other intervertebral disc degeneration, lumbar region: Secondary | ICD-10-CM | POA: Diagnosis not present

## 2020-05-15 DIAGNOSIS — E559 Vitamin D deficiency, unspecified: Secondary | ICD-10-CM | POA: Diagnosis not present

## 2020-05-15 DIAGNOSIS — R809 Proteinuria, unspecified: Secondary | ICD-10-CM | POA: Diagnosis not present

## 2020-05-19 DIAGNOSIS — J3489 Other specified disorders of nose and nasal sinuses: Secondary | ICD-10-CM | POA: Diagnosis not present

## 2020-05-19 DIAGNOSIS — G43909 Migraine, unspecified, not intractable, without status migrainosus: Secondary | ICD-10-CM | POA: Diagnosis not present

## 2020-05-19 DIAGNOSIS — E782 Mixed hyperlipidemia: Secondary | ICD-10-CM | POA: Diagnosis not present

## 2020-05-19 DIAGNOSIS — E871 Hypo-osmolality and hyponatremia: Secondary | ICD-10-CM | POA: Diagnosis not present

## 2020-05-19 DIAGNOSIS — J449 Chronic obstructive pulmonary disease, unspecified: Secondary | ICD-10-CM | POA: Diagnosis not present

## 2020-05-19 DIAGNOSIS — E041 Nontoxic single thyroid nodule: Secondary | ICD-10-CM | POA: Diagnosis not present

## 2020-05-19 DIAGNOSIS — Z7982 Long term (current) use of aspirin: Secondary | ICD-10-CM | POA: Diagnosis not present

## 2020-05-19 DIAGNOSIS — M1710 Unilateral primary osteoarthritis, unspecified knee: Secondary | ICD-10-CM | POA: Diagnosis not present

## 2020-05-19 DIAGNOSIS — E559 Vitamin D deficiency, unspecified: Secondary | ICD-10-CM | POA: Diagnosis not present

## 2020-05-19 DIAGNOSIS — I10 Essential (primary) hypertension: Secondary | ICD-10-CM | POA: Diagnosis not present

## 2020-05-19 DIAGNOSIS — I700 Atherosclerosis of aorta: Secondary | ICD-10-CM | POA: Diagnosis not present

## 2020-05-19 DIAGNOSIS — Z7984 Long term (current) use of oral hypoglycemic drugs: Secondary | ICD-10-CM | POA: Diagnosis not present

## 2020-05-19 DIAGNOSIS — E1129 Type 2 diabetes mellitus with other diabetic kidney complication: Secondary | ICD-10-CM | POA: Diagnosis not present

## 2020-05-19 DIAGNOSIS — K219 Gastro-esophageal reflux disease without esophagitis: Secondary | ICD-10-CM | POA: Diagnosis not present

## 2020-05-19 DIAGNOSIS — E1140 Type 2 diabetes mellitus with diabetic neuropathy, unspecified: Secondary | ICD-10-CM | POA: Diagnosis not present

## 2020-05-19 DIAGNOSIS — M4800 Spinal stenosis, site unspecified: Secondary | ICD-10-CM | POA: Diagnosis not present

## 2020-05-19 DIAGNOSIS — M5136 Other intervertebral disc degeneration, lumbar region: Secondary | ICD-10-CM | POA: Diagnosis not present

## 2020-05-19 DIAGNOSIS — Z9181 History of falling: Secondary | ICD-10-CM | POA: Diagnosis not present

## 2020-05-19 DIAGNOSIS — R809 Proteinuria, unspecified: Secondary | ICD-10-CM | POA: Diagnosis not present

## 2020-05-19 DIAGNOSIS — G4700 Insomnia, unspecified: Secondary | ICD-10-CM | POA: Diagnosis not present

## 2020-05-19 DIAGNOSIS — F17210 Nicotine dependence, cigarettes, uncomplicated: Secondary | ICD-10-CM | POA: Diagnosis not present

## 2020-05-19 DIAGNOSIS — M47819 Spondylosis without myelopathy or radiculopathy, site unspecified: Secondary | ICD-10-CM | POA: Diagnosis not present

## 2020-05-20 DIAGNOSIS — E559 Vitamin D deficiency, unspecified: Secondary | ICD-10-CM | POA: Diagnosis not present

## 2020-05-20 DIAGNOSIS — Z7984 Long term (current) use of oral hypoglycemic drugs: Secondary | ICD-10-CM | POA: Diagnosis not present

## 2020-05-20 DIAGNOSIS — E1129 Type 2 diabetes mellitus with other diabetic kidney complication: Secondary | ICD-10-CM | POA: Diagnosis not present

## 2020-05-20 DIAGNOSIS — I10 Essential (primary) hypertension: Secondary | ICD-10-CM | POA: Diagnosis not present

## 2020-05-20 DIAGNOSIS — F17210 Nicotine dependence, cigarettes, uncomplicated: Secondary | ICD-10-CM | POA: Diagnosis not present

## 2020-05-20 DIAGNOSIS — M1710 Unilateral primary osteoarthritis, unspecified knee: Secondary | ICD-10-CM | POA: Diagnosis not present

## 2020-05-20 DIAGNOSIS — E041 Nontoxic single thyroid nodule: Secondary | ICD-10-CM | POA: Diagnosis not present

## 2020-05-20 DIAGNOSIS — Z5181 Encounter for therapeutic drug level monitoring: Secondary | ICD-10-CM | POA: Diagnosis not present

## 2020-05-20 DIAGNOSIS — E782 Mixed hyperlipidemia: Secondary | ICD-10-CM | POA: Diagnosis not present

## 2020-05-20 DIAGNOSIS — J3489 Other specified disorders of nose and nasal sinuses: Secondary | ICD-10-CM | POA: Diagnosis not present

## 2020-05-20 DIAGNOSIS — I700 Atherosclerosis of aorta: Secondary | ICD-10-CM | POA: Diagnosis not present

## 2020-05-20 DIAGNOSIS — J449 Chronic obstructive pulmonary disease, unspecified: Secondary | ICD-10-CM | POA: Diagnosis not present

## 2020-05-20 DIAGNOSIS — Z9181 History of falling: Secondary | ICD-10-CM | POA: Diagnosis not present

## 2020-05-20 DIAGNOSIS — M47819 Spondylosis without myelopathy or radiculopathy, site unspecified: Secondary | ICD-10-CM | POA: Diagnosis not present

## 2020-05-20 DIAGNOSIS — M5136 Other intervertebral disc degeneration, lumbar region: Secondary | ICD-10-CM | POA: Diagnosis not present

## 2020-05-20 DIAGNOSIS — K219 Gastro-esophageal reflux disease without esophagitis: Secondary | ICD-10-CM | POA: Diagnosis not present

## 2020-05-20 DIAGNOSIS — R809 Proteinuria, unspecified: Secondary | ICD-10-CM | POA: Diagnosis not present

## 2020-05-20 DIAGNOSIS — E1140 Type 2 diabetes mellitus with diabetic neuropathy, unspecified: Secondary | ICD-10-CM | POA: Diagnosis not present

## 2020-05-20 DIAGNOSIS — Z7982 Long term (current) use of aspirin: Secondary | ICD-10-CM | POA: Diagnosis not present

## 2020-05-20 DIAGNOSIS — G43909 Migraine, unspecified, not intractable, without status migrainosus: Secondary | ICD-10-CM | POA: Diagnosis not present

## 2020-05-20 DIAGNOSIS — E871 Hypo-osmolality and hyponatremia: Secondary | ICD-10-CM | POA: Diagnosis not present

## 2020-05-20 DIAGNOSIS — G4700 Insomnia, unspecified: Secondary | ICD-10-CM | POA: Diagnosis not present

## 2020-05-20 DIAGNOSIS — M4800 Spinal stenosis, site unspecified: Secondary | ICD-10-CM | POA: Diagnosis not present

## 2020-05-25 DIAGNOSIS — J449 Chronic obstructive pulmonary disease, unspecified: Secondary | ICD-10-CM | POA: Diagnosis not present

## 2020-05-25 DIAGNOSIS — E871 Hypo-osmolality and hyponatremia: Secondary | ICD-10-CM | POA: Diagnosis not present

## 2020-05-25 DIAGNOSIS — Z7982 Long term (current) use of aspirin: Secondary | ICD-10-CM | POA: Diagnosis not present

## 2020-05-25 DIAGNOSIS — E1129 Type 2 diabetes mellitus with other diabetic kidney complication: Secondary | ICD-10-CM | POA: Diagnosis not present

## 2020-05-25 DIAGNOSIS — E041 Nontoxic single thyroid nodule: Secondary | ICD-10-CM | POA: Diagnosis not present

## 2020-05-25 DIAGNOSIS — M4800 Spinal stenosis, site unspecified: Secondary | ICD-10-CM | POA: Diagnosis not present

## 2020-05-25 DIAGNOSIS — M1710 Unilateral primary osteoarthritis, unspecified knee: Secondary | ICD-10-CM | POA: Diagnosis not present

## 2020-05-25 DIAGNOSIS — I10 Essential (primary) hypertension: Secondary | ICD-10-CM | POA: Diagnosis not present

## 2020-05-25 DIAGNOSIS — K219 Gastro-esophageal reflux disease without esophagitis: Secondary | ICD-10-CM | POA: Diagnosis not present

## 2020-05-25 DIAGNOSIS — G43909 Migraine, unspecified, not intractable, without status migrainosus: Secondary | ICD-10-CM | POA: Diagnosis not present

## 2020-05-25 DIAGNOSIS — F17210 Nicotine dependence, cigarettes, uncomplicated: Secondary | ICD-10-CM | POA: Diagnosis not present

## 2020-05-25 DIAGNOSIS — E1140 Type 2 diabetes mellitus with diabetic neuropathy, unspecified: Secondary | ICD-10-CM | POA: Diagnosis not present

## 2020-05-25 DIAGNOSIS — M5136 Other intervertebral disc degeneration, lumbar region: Secondary | ICD-10-CM | POA: Diagnosis not present

## 2020-05-25 DIAGNOSIS — E559 Vitamin D deficiency, unspecified: Secondary | ICD-10-CM | POA: Diagnosis not present

## 2020-05-25 DIAGNOSIS — E782 Mixed hyperlipidemia: Secondary | ICD-10-CM | POA: Diagnosis not present

## 2020-05-25 DIAGNOSIS — Z9181 History of falling: Secondary | ICD-10-CM | POA: Diagnosis not present

## 2020-05-25 DIAGNOSIS — Z7984 Long term (current) use of oral hypoglycemic drugs: Secondary | ICD-10-CM | POA: Diagnosis not present

## 2020-05-25 DIAGNOSIS — J3489 Other specified disorders of nose and nasal sinuses: Secondary | ICD-10-CM | POA: Diagnosis not present

## 2020-05-25 DIAGNOSIS — M47819 Spondylosis without myelopathy or radiculopathy, site unspecified: Secondary | ICD-10-CM | POA: Diagnosis not present

## 2020-05-25 DIAGNOSIS — R809 Proteinuria, unspecified: Secondary | ICD-10-CM | POA: Diagnosis not present

## 2020-05-25 DIAGNOSIS — I700 Atherosclerosis of aorta: Secondary | ICD-10-CM | POA: Diagnosis not present

## 2020-05-25 DIAGNOSIS — G4700 Insomnia, unspecified: Secondary | ICD-10-CM | POA: Diagnosis not present

## 2020-05-29 ENCOUNTER — Encounter (HOSPITAL_COMMUNITY): Payer: Self-pay | Admitting: *Deleted

## 2020-05-29 ENCOUNTER — Other Ambulatory Visit: Payer: Self-pay

## 2020-05-29 ENCOUNTER — Inpatient Hospital Stay (HOSPITAL_COMMUNITY)
Admission: EM | Admit: 2020-05-29 | Discharge: 2020-06-02 | DRG: 392 | Disposition: A | Discharge status: Home Health | Payer: Medicare Other | Attending: Internal Medicine | Admitting: Internal Medicine

## 2020-05-29 DIAGNOSIS — D72829 Elevated white blood cell count, unspecified: Secondary | ICD-10-CM

## 2020-05-29 DIAGNOSIS — E785 Hyperlipidemia, unspecified: Secondary | ICD-10-CM | POA: Diagnosis present

## 2020-05-29 DIAGNOSIS — F32A Depression, unspecified: Secondary | ICD-10-CM | POA: Diagnosis not present

## 2020-05-29 DIAGNOSIS — G43909 Migraine, unspecified, not intractable, without status migrainosus: Secondary | ICD-10-CM | POA: Diagnosis not present

## 2020-05-29 DIAGNOSIS — M1990 Unspecified osteoarthritis, unspecified site: Secondary | ICD-10-CM | POA: Diagnosis present

## 2020-05-29 DIAGNOSIS — L89311 Pressure ulcer of right buttock, stage 1: Secondary | ICD-10-CM | POA: Diagnosis not present

## 2020-05-29 DIAGNOSIS — Z20822 Contact with and (suspected) exposure to covid-19: Secondary | ICD-10-CM | POA: Diagnosis present

## 2020-05-29 DIAGNOSIS — I119 Hypertensive heart disease without heart failure: Secondary | ICD-10-CM | POA: Diagnosis not present

## 2020-05-29 DIAGNOSIS — I10 Essential (primary) hypertension: Secondary | ICD-10-CM | POA: Diagnosis not present

## 2020-05-29 DIAGNOSIS — Z885 Allergy status to narcotic agent status: Secondary | ICD-10-CM | POA: Diagnosis not present

## 2020-05-29 DIAGNOSIS — Z7984 Long term (current) use of oral hypoglycemic drugs: Secondary | ICD-10-CM | POA: Diagnosis not present

## 2020-05-29 DIAGNOSIS — Z9049 Acquired absence of other specified parts of digestive tract: Secondary | ICD-10-CM | POA: Diagnosis not present

## 2020-05-29 DIAGNOSIS — Z743 Need for continuous supervision: Secondary | ICD-10-CM | POA: Diagnosis not present

## 2020-05-29 DIAGNOSIS — J449 Chronic obstructive pulmonary disease, unspecified: Secondary | ICD-10-CM | POA: Diagnosis present

## 2020-05-29 DIAGNOSIS — Z8249 Family history of ischemic heart disease and other diseases of the circulatory system: Secondary | ICD-10-CM

## 2020-05-29 DIAGNOSIS — E669 Obesity, unspecified: Secondary | ICD-10-CM | POA: Diagnosis present

## 2020-05-29 DIAGNOSIS — Z6832 Body mass index (BMI) 32.0-32.9, adult: Secondary | ICD-10-CM

## 2020-05-29 DIAGNOSIS — G4700 Insomnia, unspecified: Secondary | ICD-10-CM | POA: Diagnosis not present

## 2020-05-29 DIAGNOSIS — Z79899 Other long term (current) drug therapy: Secondary | ICD-10-CM

## 2020-05-29 DIAGNOSIS — M47819 Spondylosis without myelopathy or radiculopathy, site unspecified: Secondary | ICD-10-CM | POA: Diagnosis not present

## 2020-05-29 DIAGNOSIS — R6889 Other general symptoms and signs: Secondary | ICD-10-CM | POA: Diagnosis not present

## 2020-05-29 DIAGNOSIS — F17210 Nicotine dependence, cigarettes, uncomplicated: Secondary | ICD-10-CM | POA: Diagnosis not present

## 2020-05-29 DIAGNOSIS — M1710 Unilateral primary osteoarthritis, unspecified knee: Secondary | ICD-10-CM | POA: Diagnosis not present

## 2020-05-29 DIAGNOSIS — F419 Anxiety disorder, unspecified: Secondary | ICD-10-CM | POA: Diagnosis not present

## 2020-05-29 DIAGNOSIS — E871 Hypo-osmolality and hyponatremia: Secondary | ICD-10-CM | POA: Diagnosis present

## 2020-05-29 DIAGNOSIS — E860 Dehydration: Secondary | ICD-10-CM | POA: Diagnosis not present

## 2020-05-29 DIAGNOSIS — J3489 Other specified disorders of nose and nasal sinuses: Secondary | ICD-10-CM | POA: Diagnosis not present

## 2020-05-29 DIAGNOSIS — E782 Mixed hyperlipidemia: Secondary | ICD-10-CM | POA: Diagnosis not present

## 2020-05-29 DIAGNOSIS — K219 Gastro-esophageal reflux disease without esophagitis: Secondary | ICD-10-CM | POA: Diagnosis not present

## 2020-05-29 DIAGNOSIS — Z5181 Encounter for therapeutic drug level monitoring: Secondary | ICD-10-CM | POA: Diagnosis not present

## 2020-05-29 DIAGNOSIS — E041 Nontoxic single thyroid nodule: Secondary | ICD-10-CM | POA: Diagnosis not present

## 2020-05-29 DIAGNOSIS — R0602 Shortness of breath: Secondary | ICD-10-CM

## 2020-05-29 DIAGNOSIS — I700 Atherosclerosis of aorta: Secondary | ICD-10-CM | POA: Diagnosis not present

## 2020-05-29 DIAGNOSIS — E119 Type 2 diabetes mellitus without complications: Secondary | ICD-10-CM | POA: Diagnosis not present

## 2020-05-29 DIAGNOSIS — I2510 Atherosclerotic heart disease of native coronary artery without angina pectoris: Secondary | ICD-10-CM | POA: Diagnosis not present

## 2020-05-29 DIAGNOSIS — M5136 Other intervertebral disc degeneration, lumbar region: Secondary | ICD-10-CM | POA: Diagnosis not present

## 2020-05-29 DIAGNOSIS — L89321 Pressure ulcer of left buttock, stage 1: Secondary | ICD-10-CM | POA: Diagnosis not present

## 2020-05-29 DIAGNOSIS — Z9181 History of falling: Secondary | ICD-10-CM | POA: Diagnosis not present

## 2020-05-29 DIAGNOSIS — L8990 Pressure ulcer of unspecified site, unspecified stage: Secondary | ICD-10-CM | POA: Insufficient documentation

## 2020-05-29 DIAGNOSIS — A084 Viral intestinal infection, unspecified: Principal | ICD-10-CM | POA: Diagnosis present

## 2020-05-29 DIAGNOSIS — Z7982 Long term (current) use of aspirin: Secondary | ICD-10-CM

## 2020-05-29 DIAGNOSIS — R0902 Hypoxemia: Secondary | ICD-10-CM | POA: Diagnosis not present

## 2020-05-29 DIAGNOSIS — F29 Unspecified psychosis not due to a substance or known physiological condition: Secondary | ICD-10-CM | POA: Diagnosis present

## 2020-05-29 DIAGNOSIS — E559 Vitamin D deficiency, unspecified: Secondary | ICD-10-CM | POA: Diagnosis not present

## 2020-05-29 DIAGNOSIS — E1129 Type 2 diabetes mellitus with other diabetic kidney complication: Secondary | ICD-10-CM | POA: Diagnosis not present

## 2020-05-29 DIAGNOSIS — M25569 Pain in unspecified knee: Secondary | ICD-10-CM | POA: Diagnosis not present

## 2020-05-29 DIAGNOSIS — Z91030 Bee allergy status: Secondary | ICD-10-CM | POA: Diagnosis not present

## 2020-05-29 DIAGNOSIS — E222 Syndrome of inappropriate secretion of antidiuretic hormone: Secondary | ICD-10-CM | POA: Diagnosis present

## 2020-05-29 DIAGNOSIS — E1140 Type 2 diabetes mellitus with diabetic neuropathy, unspecified: Secondary | ICD-10-CM | POA: Diagnosis not present

## 2020-05-29 DIAGNOSIS — M4800 Spinal stenosis, site unspecified: Secondary | ICD-10-CM | POA: Diagnosis not present

## 2020-05-29 DIAGNOSIS — R809 Proteinuria, unspecified: Secondary | ICD-10-CM | POA: Diagnosis not present

## 2020-05-29 LAB — CBC WITH DIFFERENTIAL/PLATELET
Abs Immature Granulocytes: 0.08 10*3/uL — ABNORMAL HIGH (ref 0.00–0.07)
Basophils Absolute: 0.1 10*3/uL (ref 0.0–0.1)
Basophils Relative: 1 %
Eosinophils Absolute: 0.1 10*3/uL (ref 0.0–0.5)
Eosinophils Relative: 1 %
HCT: 37.5 % (ref 36.0–46.0)
Hemoglobin: 13.5 g/dL (ref 12.0–15.0)
Immature Granulocytes: 1 %
Lymphocytes Relative: 31 %
Lymphs Abs: 4.1 10*3/uL — ABNORMAL HIGH (ref 0.7–4.0)
MCH: 31.8 pg (ref 26.0–34.0)
MCHC: 36 g/dL (ref 30.0–36.0)
MCV: 88.2 fL (ref 80.0–100.0)
Monocytes Absolute: 0.8 10*3/uL (ref 0.1–1.0)
Monocytes Relative: 6 %
Neutro Abs: 8.1 10*3/uL — ABNORMAL HIGH (ref 1.7–7.7)
Neutrophils Relative %: 60 %
Platelets: 320 10*3/uL (ref 150–400)
RBC: 4.25 MIL/uL (ref 3.87–5.11)
RDW: 13.8 % (ref 11.5–15.5)
WBC: 13.2 10*3/uL — ABNORMAL HIGH (ref 4.0–10.5)
nRBC: 0 % (ref 0.0–0.2)

## 2020-05-29 LAB — COMPREHENSIVE METABOLIC PANEL
ALT: 15 U/L (ref 0–44)
AST: 21 U/L (ref 15–41)
Albumin: 3.7 g/dL (ref 3.5–5.0)
Alkaline Phosphatase: 18 U/L — ABNORMAL LOW (ref 38–126)
Anion gap: 13 (ref 5–15)
BUN: 9 mg/dL (ref 8–23)
CO2: 28 mmol/L (ref 22–32)
Calcium: 9.8 mg/dL (ref 8.9–10.3)
Chloride: 75 mmol/L — ABNORMAL LOW (ref 98–111)
Creatinine, Ser: 0.79 mg/dL (ref 0.44–1.00)
GFR, Estimated: >60 mL/min (ref >60)
Glucose, Bld: 135 mg/dL — ABNORMAL HIGH (ref 70–99)
Potassium: 4.6 mmol/L (ref 3.5–5.1)
Sodium: 116 mmol/L — CL (ref 135–145)
Total Bilirubin: 0.9 mg/dL (ref 0.3–1.2)
Total Protein: 7.2 g/dL (ref 6.5–8.1)

## 2020-05-29 NOTE — ED Triage Notes (Signed)
The pt had labs drawn earlier today by the home health nurse and they called her at home and told het that she had some anmormal labs  No particular symptoms today   She arrived by gems   From her home

## 2020-05-29 NOTE — ED Notes (Signed)
Pt ambulated to restroom with the use of a walker  

## 2020-05-29 NOTE — ED Provider Notes (Signed)
[facility] EMERGENCY DEPARTMENT Provider Note   CSN: [id number] Arrival date & time: 05/29/20  1808     History Chief Complaint  Patient presents with  . abnormal labs    Alexandra Richardson is a 70 y.o. female.  Patient is a 70 year old female with past medical history of COPD, pneumonia, hyperlipidemia, hypertension.  Patient referred to the emergency department by her primary doctor for evaluation of an abnormal laboratory study.  Patient tells me she has been experiencing weakness that has progressively worsened over the past several months, but has worsened in the past few weeks to the point that she requires a walker to ambulate.  She denies any specific aches or pains, but does report some nausea, vomiting, and diarrhea over the past several days.  She denies abdominal pain.  Her laboratory studies performed by home health this morning revealed a low sodium and patient was referred here for admission.  The history is provided by the patient.       Past Medical History:  Diagnosis Date  . Anxiety   . Anxiety   . Arthritis   . Depression   . Grief 07/03/2011  . Hyperlipidemia   . Hypertension     Patient Active Problem List   Diagnosis Date Noted  . Chest discomfort 12/10/2018  . Dyspnea 05/25/2017  . Tobacco dependence 05/25/2017  . Near syncope 04/28/2017  . Dehydration with hyponatremia 04/28/2017  . Pneumonia 02/07/2013  . Hypokalemia 07/03/2011  . Grief 07/03/2011  . Encephalopathy 07/02/2011  . Hyperlipidemia 07/02/2011  . Anxiety 07/02/2011  . Overdose 07/01/2011  . Altered mental status 07/01/2011  . Hypertension 07/01/2011  . Depression 07/01/2011  . UTI (lower urinary tract infection) 07/01/2011    Past Surgical History:  Procedure Laterality Date  . CHOLECYSTECTOMY       OB History   No obstetric history on file.     Family History  Problem Relation Age of Onset  . Coronary artery disease Father   . Diabetes type II  Sister   . Hypertension Mother   . Breast cancer Neg Hx     Social History   Tobacco Use  . Smoking status: Current Every Day Smoker    Packs/day: 1.00    Years: 40.00    Pack years: 40.00    Types: Cigarettes  . Smokeless tobacco: Never Used  Vaping Use  . Vaping Use: Never used  Substance Use Topics  . Alcohol use: Yes    Comment: occasionally  . Drug use: No    Home Medications Prior to Admission medications   Medication Sig Start Date End Date Taking? Authorizing Provider  amitriptyline (ELAVIL) 150 MG tablet Take 150 mg by mouth at bedtime.    [provider]  aspirin EC 81 MG tablet Take 81 mg by mouth daily.    [provider]  atorvastatin (LIPITOR) 80 MG tablet Take 80 mg by mouth daily.     [provider]  busPIRone (BUSPAR) 15 MG tablet Take 5 mg by mouth 2 (two) times daily.  05/04/17   [provider]  celecoxib (CELEBREX) 200 MG capsule Take 200 mg by mouth 2 (two) times daily as needed for mild pain or moderate pain.    [provider]  fluticasone (FLONASE) 50 MCG/ACT nasal spray Place 2 sprays into both nostrils daily. 05/02/17   Albertine Grates, MD  gabapentin (NEURONTIN) 600 MG tablet Take 1 tablet (600 mg total) by mouth at bedtime. Patient taking  differently: Take 600 mg by mouth 3 (three) times daily.  05/01/17   Albertine Grates, MD  guaiFENesin (MUCINEX) 600 MG 12 hr tablet Take 1 tablet (600 mg total) by mouth 2 (two) times daily. Patient not taking: Reported on 12/10/2018 05/01/17   Albertine Grates, MD  hydrOXYzine (ATARAX/VISTARIL) 10 MG tablet Take 1 tablet (10 mg total) by mouth 3 (three) times daily as needed for anxiety. Patient not taking: Reported on 12/10/2018 05/01/17   Albertine Grates, MD  metoprolol succinate (TOPROL-XL) 25 MG 24 hr tablet Take 1 tablet (25 mg total) by mouth daily. Take with or immediately following a meal. 12/10/18 03/10/19  Revankar, Aundra Dubin, MD  Multiple Vitamins-Minerals (CENTRUM SILVER PO) Take by mouth.     [provider]  predniSONE (DELTASONE) 5 MG tablet Take in the morning: take 3 tablets x 2 days, then 2 tablets x 2 days, then 1 tablet x 1 day. Patient not taking: Reported on 12/10/2018 05/27/17   Edsel Petrin, DO  ramipril (ALTACE) 10 MG capsule Take 10 mg by mouth daily.     [provider]  risperiDONE (RISPERDAL) 2 MG tablet Take 2 mg by mouth 2 (two) times daily.    [provider]  tiotropium (SPIRIVA) 18 MCG inhalation capsule Place 1 capsule (18 mcg total) into inhaler and inhale daily. 02/12/13   Jeanella Craze, NP    Allergies    Bee venom and Codeine  Review of Systems   Review of Systems  All other systems reviewed and are negative.   Physical Exam Updated Vital Signs BP 111/70   Pulse 68   Temp 97.7 F (36.5 C) (Oral)   Resp (!) 26   Ht 5\' 3"  (1.6 m)   Wt 83.3 kg   SpO2 93%   BMI 32.53 kg/m   Physical Exam Vitals and nursing note reviewed.  Constitutional:      General: She is not in acute distress.    Appearance: She is well-developed. She is not diaphoretic.  HENT:     Head: Normocephalic and atraumatic.     Mouth/Throat:     Mouth: Mucous membranes are moist.  Cardiovascular:     Rate and Rhythm: Normal rate and regular rhythm.     Heart sounds: No murmur heard. No friction rub. No gallop.   Pulmonary:     Effort: Pulmonary effort is normal. No respiratory distress.     Breath sounds: Normal breath sounds. No wheezing.  Abdominal:     General: Bowel sounds are normal. There is no distension.     Palpations: Abdomen is soft.     Tenderness: There is no abdominal tenderness.  Musculoskeletal:        General: Normal range of motion.     Cervical back: Normal range of motion and neck supple. No rigidity.  Lymphadenopathy:     Cervical: No cervical adenopathy.  Skin:    General: Skin is warm and dry.  Neurological:     General: No focal deficit present.     Mental Status: She is alert and oriented to person, place,  and time.     Cranial Nerves: No cranial nerve deficit.     Sensory: No sensory deficit.     Motor: No weakness.     Coordination: Coordination normal.     ED Results / Procedures / Treatments   Labs (all labs ordered are listed, but only abnormal results are displayed) Labs Reviewed  COMPREHENSIVE METABOLIC PANEL - Abnormal; Notable for  the following components:      Result Value   Sodium 116 (*)    Chloride 75 (*)    Glucose, Bld 135 (*)    Alkaline Phosphatase 18 (*)    All other components within normal limits  CBC WITH DIFFERENTIAL/PLATELET - Abnormal; Notable for the following components:   WBC 13.2 (*)    Neutro Abs 8.1 (*)    Lymphs Abs 4.1 (*)    Abs Immature Granulocytes 0.08 (*)    All other components within normal limits    EKG None  Radiology No results found.  Procedures Procedures   Medications Ordered in ED Medications - No data to display  ED Course  I have reviewed the triage vital signs and the nursing notes.  Pertinent labs & imaging results that were available during my care of the patient were reviewed by me and considered in my medical decision making (see chart for details).    MDM Rules/Calculators/A&P  Patient referred here after laboratory studies obtained by home health revealed hyponatremia.  Patient has had progressive weakness over the past several months.  She also reports some diarrhea and vomiting over the past few days.  Patient's laboratory studies reveal a sodium of 116, the etiology of which I am uncertain.  Patient will require further work-up into this hyponatremia.  I have spoken with the hospitalist who will add on additional studies and agrees to admit.  Final Clinical Impression(s) / ED Diagnoses Final diagnoses:  None    Rx / DC Orders ED Discharge Orders    None       Geoffery Lyons, MD 05/30/20 [phone]

## 2020-05-29 NOTE — ED Triage Notes (Signed)
Emergency Medicine Provider Triage Evaluation Note  Alexandra Richardson , a 70 y.o. female  was evaluated in triage.  Pt complains of abnormal lab work.  Patient reports that her home health nurse came to counseling recently.  She told the patient that she needed to come to the emergency department due to lab work abnormality.  Patient cannot remember which lab was abnormal.  Patient thinks it may be her sodium but is unsure.  Patient reports that he had 3 episodes of vomiting yesterday and diarrhea this morning.  Patient denies any other complaints.  Review of Systems  Positive: Nausea, vomiting, diarrhea Negative: Fever, chills, melena, blood in stool, hematuria  Physical Exam  BP 124/66 (BP Location: Left Arm)   Pulse 69   Temp 97.7 F (36.5 C) (Oral)   Resp (!) 27   SpO2 95%  Gen:   Awake, no distress   HEENT:  Atraumatic  Resp:  Normal effort  Cardiac:  Normal rate  MSK:   Moves extremities without difficulty  Neuro:  Speech clear   Medical Decision Making  Medically screening exam initiated at 6:47 PM.  Appropriate orders placed.  Baya Lentz was informed that the remainder of the evaluation will be completed by another provider, this initial triage assessment does not replace that evaluation, and the importance of remaining in the ED until their evaluation is complete.  Clinical Impression   The patient appears stable so that the remainder of the work up may be completed by another provider.     Haskel Schroeder, [place] 05/29/20 1849

## 2020-05-30 ENCOUNTER — Inpatient Hospital Stay (HOSPITAL_COMMUNITY): Payer: Medicare Other

## 2020-05-30 ENCOUNTER — Other Ambulatory Visit: Payer: Self-pay

## 2020-05-30 ENCOUNTER — Encounter (HOSPITAL_COMMUNITY): Payer: Self-pay | Admitting: Internal Medicine

## 2020-05-30 DIAGNOSIS — I10 Essential (primary) hypertension: Secondary | ICD-10-CM | POA: Diagnosis not present

## 2020-05-30 DIAGNOSIS — E782 Mixed hyperlipidemia: Secondary | ICD-10-CM

## 2020-05-30 DIAGNOSIS — F29 Unspecified psychosis not due to a substance or known physiological condition: Secondary | ICD-10-CM | POA: Diagnosis present

## 2020-05-30 DIAGNOSIS — F419 Anxiety disorder, unspecified: Secondary | ICD-10-CM | POA: Diagnosis present

## 2020-05-30 DIAGNOSIS — E669 Obesity, unspecified: Secondary | ICD-10-CM | POA: Diagnosis present

## 2020-05-30 DIAGNOSIS — Z7982 Long term (current) use of aspirin: Secondary | ICD-10-CM | POA: Diagnosis not present

## 2020-05-30 DIAGNOSIS — M1990 Unspecified osteoarthritis, unspecified site: Secondary | ICD-10-CM | POA: Diagnosis present

## 2020-05-30 DIAGNOSIS — E222 Syndrome of inappropriate secretion of antidiuretic hormone: Secondary | ICD-10-CM | POA: Diagnosis present

## 2020-05-30 DIAGNOSIS — D72829 Elevated white blood cell count, unspecified: Secondary | ICD-10-CM | POA: Diagnosis not present

## 2020-05-30 DIAGNOSIS — L89311 Pressure ulcer of right buttock, stage 1: Secondary | ICD-10-CM | POA: Diagnosis present

## 2020-05-30 DIAGNOSIS — E871 Hypo-osmolality and hyponatremia: Secondary | ICD-10-CM | POA: Diagnosis not present

## 2020-05-30 DIAGNOSIS — L8990 Pressure ulcer of unspecified site, unspecified stage: Secondary | ICD-10-CM | POA: Insufficient documentation

## 2020-05-30 DIAGNOSIS — Z20822 Contact with and (suspected) exposure to covid-19: Secondary | ICD-10-CM | POA: Diagnosis present

## 2020-05-30 DIAGNOSIS — J90 Pleural effusion, not elsewhere classified: Secondary | ICD-10-CM | POA: Diagnosis not present

## 2020-05-30 DIAGNOSIS — E869 Volume depletion, unspecified: Secondary | ICD-10-CM | POA: Diagnosis not present

## 2020-05-30 DIAGNOSIS — Z91030 Bee allergy status: Secondary | ICD-10-CM | POA: Diagnosis not present

## 2020-05-30 DIAGNOSIS — I2510 Atherosclerotic heart disease of native coronary artery without angina pectoris: Secondary | ICD-10-CM | POA: Diagnosis present

## 2020-05-30 DIAGNOSIS — E119 Type 2 diabetes mellitus without complications: Secondary | ICD-10-CM | POA: Diagnosis present

## 2020-05-30 DIAGNOSIS — E860 Dehydration: Secondary | ICD-10-CM | POA: Diagnosis present

## 2020-05-30 DIAGNOSIS — I517 Cardiomegaly: Secondary | ICD-10-CM | POA: Diagnosis not present

## 2020-05-30 DIAGNOSIS — Z9049 Acquired absence of other specified parts of digestive tract: Secondary | ICD-10-CM | POA: Diagnosis not present

## 2020-05-30 DIAGNOSIS — I119 Hypertensive heart disease without heart failure: Secondary | ICD-10-CM | POA: Diagnosis present

## 2020-05-30 DIAGNOSIS — A084 Viral intestinal infection, unspecified: Secondary | ICD-10-CM | POA: Diagnosis present

## 2020-05-30 DIAGNOSIS — J449 Chronic obstructive pulmonary disease, unspecified: Secondary | ICD-10-CM | POA: Diagnosis present

## 2020-05-30 DIAGNOSIS — Z885 Allergy status to narcotic agent status: Secondary | ICD-10-CM | POA: Diagnosis not present

## 2020-05-30 DIAGNOSIS — Z79899 Other long term (current) drug therapy: Secondary | ICD-10-CM | POA: Diagnosis not present

## 2020-05-30 DIAGNOSIS — F17210 Nicotine dependence, cigarettes, uncomplicated: Secondary | ICD-10-CM | POA: Diagnosis present

## 2020-05-30 DIAGNOSIS — Z8249 Family history of ischemic heart disease and other diseases of the circulatory system: Secondary | ICD-10-CM | POA: Diagnosis not present

## 2020-05-30 DIAGNOSIS — E785 Hyperlipidemia, unspecified: Secondary | ICD-10-CM | POA: Diagnosis present

## 2020-05-30 DIAGNOSIS — F32A Depression, unspecified: Secondary | ICD-10-CM | POA: Diagnosis not present

## 2020-05-30 DIAGNOSIS — R0602 Shortness of breath: Secondary | ICD-10-CM | POA: Diagnosis not present

## 2020-05-30 DIAGNOSIS — L89321 Pressure ulcer of left buttock, stage 1: Secondary | ICD-10-CM | POA: Diagnosis present

## 2020-05-30 LAB — URINALYSIS, ROUTINE W REFLEX MICROSCOPIC
Bilirubin Urine: NEGATIVE
Glucose, UA: NEGATIVE mg/dL
Ketones, ur: NEGATIVE mg/dL
Nitrite: NEGATIVE
Protein, ur: 30 mg/dL — AB
Specific Gravity, Urine: 1.006 (ref 1.005–1.030)
WBC, UA: >50 WBC/hpf — ABNORMAL HIGH (ref 0–5)
pH: 6 (ref 5.0–8.0)

## 2020-05-30 LAB — URIC ACID: Uric Acid, Serum: 5.3 mg/dL (ref 2.5–7.1)

## 2020-05-30 LAB — CBG MONITORING, ED
Glucose-Capillary: 133 mg/dL — ABNORMAL HIGH (ref 70–99)
Glucose-Capillary: 123 mg/dL — ABNORMAL HIGH (ref 70–99)

## 2020-05-30 LAB — GLUCOSE, CAPILLARY
Glucose-Capillary: 100 mg/dL — ABNORMAL HIGH (ref 70–99)
Glucose-Capillary: 118 mg/dL — ABNORMAL HIGH (ref 70–99)
Glucose-Capillary: 213 mg/dL — ABNORMAL HIGH (ref 70–99)
Glucose-Capillary: 136 mg/dL — ABNORMAL HIGH (ref 70–99)
Glucose-Capillary: 168 mg/dL — ABNORMAL HIGH (ref 70–99)

## 2020-05-30 LAB — BASIC METABOLIC PANEL
Anion gap: 10 (ref 5–15)
Anion gap: 9 (ref 5–15)
Anion gap: 13 (ref 5–15)
Anion gap: 11 (ref 5–15)
Anion gap: 11 (ref 5–15)
BUN: 9 mg/dL (ref 8–23)
BUN: 9 mg/dL (ref 8–23)
BUN: 7 mg/dL — ABNORMAL LOW (ref 8–23)
BUN: 6 mg/dL — ABNORMAL LOW (ref 8–23)
BUN: 6 mg/dL — ABNORMAL LOW (ref 8–23)
CO2: 29 mmol/L (ref 22–32)
CO2: 28 mmol/L (ref 22–32)
CO2: 25 mmol/L (ref 22–32)
CO2: 24 mmol/L (ref 22–32)
CO2: 25 mmol/L (ref 22–32)
Calcium: 9.6 mg/dL (ref 8.9–10.3)
Calcium: 9.5 mg/dL (ref 8.9–10.3)
Calcium: 9.5 mg/dL (ref 8.9–10.3)
Calcium: 9.1 mg/dL (ref 8.9–10.3)
Calcium: 9.2 mg/dL (ref 8.9–10.3)
Chloride: 77 mmol/L — ABNORMAL LOW (ref 98–111)
Chloride: 81 mmol/L — ABNORMAL LOW (ref 98–111)
Chloride: 83 mmol/L — ABNORMAL LOW (ref 98–111)
Chloride: 86 mmol/L — ABNORMAL LOW (ref 98–111)
Chloride: 90 mmol/L — ABNORMAL LOW (ref 98–111)
Creatinine, Ser: 0.71 mg/dL (ref 0.44–1.00)
Creatinine, Ser: 0.66 mg/dL (ref 0.44–1.00)
Creatinine, Ser: 0.67 mg/dL (ref 0.44–1.00)
Creatinine, Ser: 0.65 mg/dL (ref 0.44–1.00)
Creatinine, Ser: 0.74 mg/dL (ref 0.44–1.00)
GFR, Estimated: >60 mL/min (ref >60)
GFR, Estimated: >60 mL/min (ref >60)
GFR, Estimated: >60 mL/min (ref >60)
GFR, Estimated: >60 mL/min (ref >60)
GFR, Estimated: >60 mL/min (ref >60)
Glucose, Bld: 116 mg/dL — ABNORMAL HIGH (ref 70–99)
Glucose, Bld: 106 mg/dL — ABNORMAL HIGH (ref 70–99)
Glucose, Bld: 169 mg/dL — ABNORMAL HIGH (ref 70–99)
Glucose, Bld: 110 mg/dL — ABNORMAL HIGH (ref 70–99)
Glucose, Bld: 148 mg/dL — ABNORMAL HIGH (ref 70–99)
Potassium: 4.1 mmol/L (ref 3.5–5.1)
Potassium: 3.9 mmol/L (ref 3.5–5.1)
Potassium: 3.9 mmol/L (ref 3.5–5.1)
Potassium: 4.2 mmol/L (ref 3.5–5.1)
Potassium: 4 mmol/L (ref 3.5–5.1)
Sodium: 116 mmol/L — CL (ref 135–145)
Sodium: 118 mmol/L — CL (ref 135–145)
Sodium: 121 mmol/L — ABNORMAL LOW (ref 135–145)
Sodium: 121 mmol/L — ABNORMAL LOW (ref 135–145)
Sodium: 126 mmol/L — ABNORMAL LOW (ref 135–145)

## 2020-05-30 LAB — OSMOLALITY, URINE: Osmolality, Ur: 197 mOsm/kg — ABNORMAL LOW (ref 300–900)

## 2020-05-30 LAB — CREATININE, URINE, RANDOM: Creatinine, Urine: 24.59 mg/dL

## 2020-05-30 LAB — CBC
HCT: 35.4 % — ABNORMAL LOW (ref 36.0–46.0)
Hemoglobin: 12.6 g/dL (ref 12.0–15.0)
MCH: 31.3 pg (ref 26.0–34.0)
MCHC: 35.6 g/dL (ref 30.0–36.0)
MCV: 87.8 fL (ref 80.0–100.0)
Platelets: 264 10*3/uL (ref 150–400)
RBC: 4.03 MIL/uL (ref 3.87–5.11)
RDW: 13.5 % (ref 11.5–15.5)
WBC: 8.6 10*3/uL (ref 4.0–10.5)
nRBC: 0 % (ref 0.0–0.2)

## 2020-05-30 LAB — CORTISOL-AM, BLOOD: Cortisol - AM: 7.3 ug/dL (ref 6.7–22.6)

## 2020-05-30 LAB — OSMOLALITY
Osmolality: 244 mOsm/kg — CL (ref 275–295)
Osmolality: 258 mOsm/kg — ABNORMAL LOW (ref 275–295)

## 2020-05-30 LAB — BRAIN NATRIURETIC PEPTIDE: B Natriuretic Peptide: 22.9 pg/mL (ref 0.0–100.0)

## 2020-05-30 LAB — TSH: TSH: 2.686 u[IU]/mL (ref 0.350–4.500)

## 2020-05-30 LAB — SODIUM, URINE, RANDOM: Sodium, Ur: <10 mmol/L

## 2020-05-30 LAB — SARS CORONAVIRUS 2 (TAT 6-24 HRS): SARS Coronavirus 2: NEGATIVE

## 2020-05-30 LAB — MAGNESIUM: Magnesium: 1.6 mg/dL — ABNORMAL LOW (ref 1.7–2.4)

## 2020-05-30 MED ORDER — SODIUM CHLORIDE 0.9 % IV SOLN: INTRAVENOUS | Status: DC

## 2020-05-30 MED ORDER — SODIUM CHLORIDE 0.9 % IV SOLN: Freq: Once | INTRAVENOUS | Status: AC

## 2020-05-30 MED ORDER — ACETAMINOPHEN 650 MG RE SUPP: 650.0000 mg | Freq: Four times a day (QID) | RECTAL | Status: DC | PRN

## 2020-05-30 MED ORDER — RAMIPRIL 10 MG PO CAPS
10.0000 mg | ORAL_CAPSULE | Freq: Every day | ORAL | Status: DC
  Filled 2020-05-30: qty 1

## 2020-05-30 MED ORDER — AMLODIPINE BESYLATE 10 MG PO TABS
10.0000 mg | ORAL_TABLET | Freq: Every day | ORAL | Status: DC
  Administered 2020-05-30: 10 mg via ORAL
  Filled 2020-05-30: qty 1

## 2020-05-30 MED ORDER — MAGNESIUM SULFATE 2 GM/50ML IV SOLN
2.0000 g | Freq: Once | INTRAVENOUS | Status: AC
  Administered 2020-05-30: 2 g via INTRAVENOUS
  Filled 2020-05-30: qty 50

## 2020-05-30 MED ORDER — ATORVASTATIN CALCIUM 80 MG PO TABS
80.0000 mg | ORAL_TABLET | Freq: Every day | ORAL | Status: DC
  Administered 2020-05-30 – 2020-06-02 (×4): 80 mg via ORAL
  Filled 2020-05-30 (×4): qty 1

## 2020-05-30 MED ORDER — ENOXAPARIN SODIUM 40 MG/0.4ML ~~LOC~~ SOLN
40.0000 mg | SUBCUTANEOUS | Status: DC
  Administered 2020-05-30 – 2020-06-02 (×4): 40 mg via SUBCUTANEOUS
  Filled 2020-05-30 (×4): qty 0.4

## 2020-05-30 MED ORDER — SODIUM CHLORIDE 0.9% FLUSH
3.0000 mL | Freq: Two times a day (BID) | INTRAVENOUS | Status: DC
  Administered 2020-05-30 – 2020-05-31 (×3): 3 mL via INTRAVENOUS

## 2020-05-30 MED ORDER — UMECLIDINIUM BROMIDE 62.5 MCG/INH IN AEPB
1.0000 | INHALATION_SPRAY | Freq: Every day | RESPIRATORY_TRACT | Status: DC
  Administered 2020-05-31 – 2020-06-02 (×3): 1 via RESPIRATORY_TRACT
  Filled 2020-05-30: qty 7

## 2020-05-30 MED ORDER — ENSURE ENLIVE PO LIQD
237.0000 mL | Freq: Two times a day (BID) | ORAL | Status: DC
  Administered 2020-05-30 – 2020-05-31 (×4): 237 mL via ORAL

## 2020-05-30 MED ORDER — ACETAMINOPHEN 325 MG PO TABS
650.0000 mg | ORAL_TABLET | Freq: Four times a day (QID) | ORAL | Status: DC | PRN
  Administered 2020-05-30: 650 mg via ORAL
  Filled 2020-05-30: qty 2

## 2020-05-30 MED ORDER — TIOTROPIUM BROMIDE MONOHYDRATE 18 MCG IN CAPS: 18.0000 ug | ORAL_CAPSULE | Freq: Every day | RESPIRATORY_TRACT | Status: DC

## 2020-05-30 MED ORDER — SUMATRIPTAN SUCCINATE 100 MG PO TABS
100.0000 mg | ORAL_TABLET | Freq: Two times a day (BID) | ORAL | Status: DC | PRN
  Filled 2020-05-30: qty 1

## 2020-05-30 MED ORDER — FAMOTIDINE 20 MG PO TABS
40.0000 mg | ORAL_TABLET | Freq: Every day | ORAL | Status: DC
  Administered 2020-05-30 – 2020-06-02 (×4): 40 mg via ORAL
  Filled 2020-05-30 (×4): qty 2

## 2020-05-30 MED ORDER — ALBUTEROL SULFATE (2.5 MG/3ML) 0.083% IN NEBU: 2.5000 mg | INHALATION_SOLUTION | RESPIRATORY_TRACT | Status: DC | PRN

## 2020-05-30 MED ORDER — INSULIN ASPART 100 UNIT/ML ~~LOC~~ SOLN
0.0000 [IU] | SUBCUTANEOUS | Status: DC
  Administered 2020-05-30: 2 [IU] via SUBCUTANEOUS
  Administered 2020-05-30 (×2): 1 [IU] via SUBCUTANEOUS
  Administered 2020-05-30: 3 [IU] via SUBCUTANEOUS
  Administered 2020-05-31: 2 [IU] via SUBCUTANEOUS
  Administered 2020-05-31: 1 [IU] via SUBCUTANEOUS
  Administered 2020-05-31: 3 [IU] via SUBCUTANEOUS
  Administered 2020-05-31: 1 [IU] via SUBCUTANEOUS
  Administered 2020-05-31 (×2): 2 [IU] via SUBCUTANEOUS
  Administered 2020-06-01 (×2): 1 [IU] via SUBCUTANEOUS
  Administered 2020-06-01: 2 [IU] via SUBCUTANEOUS
  Administered 2020-06-01: 1 [IU] via SUBCUTANEOUS
  Administered 2020-06-01: 3 [IU] via SUBCUTANEOUS
  Administered 2020-06-01: 2 [IU] via SUBCUTANEOUS
  Administered 2020-06-02: 1 [IU] via SUBCUTANEOUS
  Administered 2020-06-02: 2 [IU] via SUBCUTANEOUS
  Administered 2020-06-02 (×2): 1 [IU] via SUBCUTANEOUS

## 2020-05-30 MED ORDER — ASPIRIN EC 81 MG PO TBEC
81.0000 mg | DELAYED_RELEASE_TABLET | Freq: Every day | ORAL | Status: DC
  Administered 2020-05-30 – 2020-06-02 (×4): 81 mg via ORAL
  Filled 2020-05-30 (×4): qty 1

## 2020-05-30 NOTE — Progress Notes (Signed)
Initial Nutrition Assessment  RD working remotely.  DOCUMENTATION CODES:   Not applicable  INTERVENTION:   - Continue Ensure Enlive po BID, each supplement provides 350 kcal and 20 grams of protein  - Encourage adequate PO intake  NUTRITION DIAGNOSIS:   Increased nutrient needs related to acute illness as evidenced by estimated needs.  GOAL:   Patient will meet greater than or equal to 90% of their needs  MONITOR:   PO intake,Supplement acceptance,Labs,Weight trends,Skin  REASON FOR ASSESSMENT:   Malnutrition Screening Tool    ASSESSMENT:   70 year old female who presented to the ED on 4/22 with hyponatremia. PMH of HLD, HTN, depression, anxiety, COPD, DM.   Per H&P, pt has been experiencing N/V for a few days and had 1 episode of diarrhea.  Attempted to speak with pt via phone call to room. However, no answer. Will attempt to obtain diet and weight history at follow-up.  Reviewed weight history in chart. Pt with a 9.9 kg weight loss since 12/10/18. This is an 11.9% weight loss which is not significant for timeframe. However, weight loss may be more acute in nature but unable to confirm without history. Per nursing edema assessment, pt with non-pitting edema to BLE which may be masking pt's true dry weight.  No meal completions documented since admission. Will continue with Ensure Enlive supplement.  Medications reviewed and include: pepcid, Ensure Enlive BID, SSI q 4 hours IVF: NS @ 100 ml/hr  Labs reviewed: sodium 118 CBG's: 100-133  NUTRITION - FOCUSED PHYSICAL EXAM:  Unable to complete at this time. RD working remotely.  Diet Order:   Diet Order            DIET SOFT Room service appropriate? Yes; Fluid consistency: Thin  Diet effective now                 EDUCATION NEEDS:   No education needs have been identified at this time  Skin:  Skin Assessment: Skin Integrity Issues: Stage I: buttocks  Last BM:  05/29/20  Height:   Ht Readings from  Last 1 Encounters:  05/30/20 5\' 3"  (1.6 m)    Weight:   Wt Readings from Last 1 Encounters:  05/30/20 73.4 kg    BMI:  Body mass index is 28.68 kg/m.  Estimated Nutritional Needs:   Kcal:  1550-1750  Protein:  75-90 grams  Fluid:  1.6-1.8 L    06/01/20, MS, RD, LDN Inpatient Clinical Dietitian Please see AMiON for contact information.

## 2020-05-30 NOTE — Progress Notes (Signed)
Pt's sats on RA have been consistently around 90% today. She says she feels fine and not SOB. Ambulates to BR without becoming SOB. Order is to keep sats > 92%. Dr Thedore Mins notified. Will place on O2 @ 1 liter [place] overnight to keep sats > 92% per MD instructions.

## 2020-05-30 NOTE — H&P (Signed)
History and Physical   Alexandra Richardson ZOX:[id number] DOB: [birth year] DOA: 05/29/2020  PCP: Ailene Ravel, MD   Patient coming from: Home  Chief Complaint: Abnormal labs  HPI: Alexandra Richardson is a 70 y.o. female with medical history significant of CVD, hyperlipidemia, hypertension, anxiety, depression, diabetes who presents from home due to abnormal labs.  Patient states that she had a home health nurse come out to check labs in the past couple of days and she was notified of the results and informed that she should present to the ED.  She states that she initially could not remember what lab was abnormal she thought it was sodium.  She states that she has had some nausea for the past few days with vomiting and a small episode of "diarrhea" today.  She also reports that she has had several months of weakness.  She denies fever, chills, chest pain, shortness of breath, abdominal pain.  ED Course: Vital signs in the ED were stable.  Lab work-up showed CMP with sodium 116, chloride 75, glucose 135.  CBC showed leukocytosis to 13.2.  No initial interventions in the ED.  Review of Systems: As per HPI otherwise all other systems reviewed and are negative.  Past Medical History:  Diagnosis Date  . Anxiety   . Anxiety   . Arthritis   . Depression   . Grief 07/03/2011  . Hyperlipidemia   . Hypertension     Past Surgical History:  Procedure Laterality Date  . CHOLECYSTECTOMY      Social History  reports that she has been smoking cigarettes. She has a 40.00 pack-year smoking history. She has never used smokeless tobacco. She reports current alcohol use. She reports that she does not use drugs.  Allergies  Allergen Reactions  . Bee Venom Other (See Comments)  . Codeine Nausea And Vomiting    Family History  Problem Relation Age of Onset  . Coronary artery disease Father   . Diabetes type II Sister   . Hypertension Mother   . Breast cancer Neg Hx   Reviewed on  admission  Prior to Admission medications   Medication Sig Start Date End Date Taking? Authorizing Provider  amitriptyline (ELAVIL) 150 MG tablet Take 150 mg by mouth at bedtime.    [provider]  aspirin EC 81 MG tablet Take 81 mg by mouth daily.    [provider]  atorvastatin (LIPITOR) 80 MG tablet Take 80 mg by mouth daily.     [provider]  busPIRone (BUSPAR) 15 MG tablet Take 5 mg by mouth 2 (two) times daily.  05/04/17   [provider]  celecoxib (CELEBREX) 200 MG capsule Take 200 mg by mouth 2 (two) times daily as needed for mild pain or moderate pain.    [provider]  fluticasone (FLONASE) 50 MCG/ACT nasal spray Place 2 sprays into both nostrils daily. 05/02/17   Albertine Grates, MD  gabapentin (NEURONTIN) 600 MG tablet Take 1 tablet (600 mg total) by mouth at bedtime. Patient taking differently: Take 600 mg by mouth 3 (three) times daily.  05/01/17   Albertine Grates, MD  guaiFENesin (MUCINEX) 600 MG 12 hr tablet Take 1 tablet (600 mg total) by mouth 2 (two) times daily. Patient not taking: Reported on 12/10/2018 05/01/17   Albertine Grates, MD  hydrOXYzine (ATARAX/VISTARIL) 10 MG tablet Take 1 tablet (10 mg total) by mouth 3 (three) times daily as needed for anxiety. Patient not taking: Reported on 12/10/2018 05/01/17  Albertine Grates, MD  metoprolol succinate (TOPROL-XL) 25 MG 24 hr tablet Take 1 tablet (25 mg total) by mouth daily. Take with or immediately following a meal. 12/10/18 03/10/19  Revankar, Aundra Dubin, MD  Multiple Vitamins-Minerals (CENTRUM SILVER PO) Take by mouth.    [provider]  predniSONE (DELTASONE) 5 MG tablet Take in the morning: take 3 tablets x 2 days, then 2 tablets x 2 days, then 1 tablet x 1 day. Patient not taking: Reported on 12/10/2018 05/27/17   Edsel Petrin, DO  ramipril (ALTACE) 10 MG capsule Take 10 mg by mouth daily.     [provider]  risperiDONE (RISPERDAL) 2 MG tablet Take 2 mg by mouth 2 (two) times daily.     [provider]  tiotropium (SPIRIVA) 18 MCG inhalation capsule Place 1 capsule (18 mcg total) into inhaler and inhale daily. 02/12/13   Jeanella Craze, NP    Physical Exam: Vitals:   05/29/20 2230 05/29/20 2330 05/30/20 0000 05/30/20 0030  BP: 111/70 131/78 127/79 132/75  Pulse: 68 67 61 62  Resp: (!) 26 (!) 22 10 12   Temp:      TempSrc:      SpO2: 93% 92% 92% 93%  Weight:      Height:       Physical Exam Constitutional:      General: She is not in acute distress.    Appearance: Normal appearance. She is obese.  HENT:     Head: Normocephalic and atraumatic.     Mouth/Throat:     Mouth: Mucous membranes are moist.     Pharynx: Oropharynx is clear.  Eyes:     Extraocular Movements: Extraocular movements intact.     Pupils: Pupils are equal, round, and reactive to light.  Cardiovascular:     Rate and Rhythm: Normal rate and regular rhythm.     Pulses: Normal pulses.     Heart sounds: Normal heart sounds.  Pulmonary:     Effort: Pulmonary effort is normal. No respiratory distress.     Breath sounds: Normal breath sounds.  Abdominal:     General: Bowel sounds are normal. There is no distension.     Palpations: Abdomen is soft.     Tenderness: There is no abdominal tenderness.  Musculoskeletal:        General: No swelling or deformity.  Skin:    General: Skin is warm and dry.  Neurological:     General: No focal deficit present.     Mental Status: Mental status is at baseline.    Labs on Admission: I have personally reviewed following labs and imaging studies  CBC: Recent Labs  Lab 05/29/20 1848  WBC 13.2*  NEUTROABS 8.1*  HGB 13.5  HCT 37.5  MCV 88.2  PLT 320    Basic Metabolic Panel: Recent Labs  Lab 05/29/20 1848  NA 116*  K 4.6  CL 75*  CO2 28  GLUCOSE 135*  BUN 9  CREATININE 0.79  CALCIUM 9.8    GFR: Estimated Creatinine Clearance: 66 mL/min (by C-G formula based on SCr of 0.79 mg/dL).  Liver Function Tests: Recent Labs  Lab  05/29/20 1848  AST 21  ALT 15  ALKPHOS 18*  BILITOT 0.9  PROT 7.2  ALBUMIN 3.7    Urine analysis:    Component Value Date/Time   COLORURINE YELLOW 07/19/2018 1234   APPEARANCEUR HAZY (A) 07/19/2018 1234   LABSPEC 1.008 07/19/2018 1234   PHURINE 6.0 07/19/2018 1234   GLUCOSEU >=  500 (A) 07/19/2018 1234   HGBUR NEGATIVE 07/19/2018 1234   BILIRUBINUR NEGATIVE 07/19/2018 1234   KETONESUR NEGATIVE 07/19/2018 1234   PROTEINUR NEGATIVE 07/19/2018 1234   UROBILINOGEN 2.0 (H) 02/07/2013 1827   NITRITE NEGATIVE 07/19/2018 1234   LEUKOCYTESUR LARGE (A) 07/19/2018 1234    Radiological Exams on Admission: No results found.  EKG: Not yet obtained  Assessment/Plan Principal Problem:   Hyponatremia Active Problems:   Hypertension   Depression   Hyperlipidemia   Anxiety  Hyponatremia > Patient sent from home after her home health nurse came out to do labs and she was noted to have hyponatremia. > Hyponatremia confirmed in the ED at 116 > 1 day of diarrhea a couple days of nausea and vomiting and a few months of weakness.  Unclear if nausea and vomiting preceded or was due to hyponatremia. > Remains alert and oriented x4 > Patient is on multiple centrally acting medications that can be associated with hyponatremia/SIADH including risperidone and gabapentin.  States she is no longer taking amitriptyline. > History of dehydration with hyponatremia though creatinine is stable. > States she has been eating less recently but it does appear she has had salty foods. - Monitor progressive unit - Start on 100 cc/h normal saline - Every 4 hours BMP - Check TSH and a.m. cortisol - Check serum osmolality - Check urine osmolality and urine sodium  Leukocytosis > Leukocytosis to 13.2 in the ED. > No fevers noted, but will check urine, chest x-ray for possible infection > Possibly reactive?  Hypertension - Continue home ramipril  Hyperlipidemia - Continue home atorvastatin  Anxiety  and depression - Holding home BuSpar, gabapentin, Lyrica, Celebrex in the setting of hyponatremia as above  COPD - Replace home Spiriva with formulary Incruse - As needed albuterol  Diabetes - SSI  DVT prophylaxis: Lovenox Code Status:   Full Family Communication:  None on admission Disposition Plan:   Patient is from:  Home  Anticipated DC to:  Pending clinical course  Anticipated DC date:  2 to 5 days  Anticipated DC barriers: None  Consults called:  None  Admission status:  Inpatient, progressive   Severity of Illness: The appropriate patient status for this patient is INPATIENT. Inpatient status is judged to be reasonable and necessary in order to provide the required intensity of service to ensure the patient's safety. The patient's presenting symptoms, physical exam findings, and initial radiographic and laboratory data in the context of their chronic comorbidities is felt to place them at high risk for further clinical deterioration. Furthermore, it is not anticipated that the patient will be medically stable for discharge from the hospital within 2 midnights of admission. The following factors support the patient status of inpatient.   " The patient's presenting symptoms include weakness, nausea, vomiting. " The worrisome physical exam findings include stable exam. " The initial radiographic and laboratory data are worrisome because of sodium 116, WBC 13.2.  " The chronic co-morbidities include CAD, hyperlipidemia, hypertension, anxiety, depression, diabetes.   * I certify that at the point of admission it is my clinical judgment that the patient will require inpatient hospital care spanning beyond 2 midnights from the point of admission due to high intensity of service, high risk for further deterioration and high frequency of surveillance required.Alexandra Richardson*   Kermitt Harjo B Melia Hopes MD [facility]  How to contact the [facility] Attending or Consulting provider 7A - 7P or covering  provider during after hours 7P -7A, for this patient?  1. Check the care team in [facility] and look for a) attending/consulting [facility] provider listed and b) the [facility] team listed 2. Log into [email or web address] and use [facility] universal password to access. If you do not have the password, please contact the hospital operator. 3. Locate the [facility] provider you are looking for under [facility] and page to a number that you can be directly reached. 4. If you still have difficulty reaching the provider, please page the [facility] (Director on Call) for the Hospitalists listed on amion for assistance.  05/30/2020, 1:14 AM

## 2020-05-30 NOTE — Plan of Care (Signed)
  Problem: Activity: Goal: Risk for activity intolerance will decrease Outcome: Progressing   Problem: Nutrition: Goal: Adequate nutrition will be maintained Outcome: Progressing   Problem: Coping: Goal: Level of anxiety will decrease Outcome: Progressing   

## 2020-05-30 NOTE — Consult Note (Addendum)
Renal Service Consult Note [facility]  Alexandra Richardson 05/30/2020 Alexandra Krabbe, MD Requesting Physician: Dr. Thedore Mins, Demetrius Charity.   Reason for Consult: Hyponatremia HPI: The patient is a 70 y.o. year-old w/ hx of HTN, HL, depression, anxiety, DJD presented to ED sent for abnormal labs. Pt reported episodic vomiting an diarrhea over the last 2-3 days prior. In ED VS were stable, labs showed Na 116, Cl 75, glu 135, normal renal fxn.  WBC 13k.  She was admitted and started on IV NS at 100 cc/hr w/ q 4h bmp ordered.  Asked to see for hyponatremia.   Pt denies hx of prior issues w/ low Na+ levels, kidney failure. No new medications. Pt lives alone w/ her dog, her sister is currently looking after her place and pet. Pt describes GI virus w/ n/v/d for a few days recently.  Feeling better now, keeping food down. No SOB, edema, no hx heart disease, CAD, CHF. No thyroid or adrenal history.   ROS  denies CP  no joint pain   no HA  no blurry vision  no rash  no dysuria  no difficulty voiding  no change in urine color    Past Medical History  Past Medical History:  Diagnosis Date  . Anxiety   . Anxiety   . Arthritis   . Depression   . Grief 07/03/2011  . Hyperlipidemia   . Hypertension    Past Surgical History  Past Surgical History:  Procedure Laterality Date  . CHOLECYSTECTOMY     Family History  Family History  Problem Relation Age of Onset  . Coronary artery disease Father   . Diabetes type II Sister   . Hypertension Mother   . Breast cancer Neg Hx    Social History  reports that she has been smoking cigarettes. She has a 40.00 pack-year smoking history. She has never used smokeless tobacco. She reports current alcohol use. She reports that she does not use drugs. Allergies  Allergies  Allergen Reactions  . Bee Venom Swelling  . Codeine Nausea And Vomiting   Home medications Prior to Admission medications   Medication Sig Start Date End Date Taking?  Authorizing Provider  aspirin EC 81 MG tablet Take 81 mg by mouth daily.   Yes [provider]  atorvastatin (LIPITOR) 80 MG tablet Take 80 mg by mouth daily.    Yes [provider]  busPIRone (BUSPAR) 15 MG tablet Take 5 mg by mouth 2 (two) times daily.  05/04/17  Yes [provider]  celecoxib (CELEBREX) 200 MG capsule Take 200 mg by mouth 2 (two) times daily as needed for mild pain or moderate pain.   Yes [provider]  cyclobenzaprine (FLEXERIL) 5 MG tablet Take 5 mg by mouth at bedtime. 05/20/20  Yes [provider]  famotidine (PEPCID) 40 MG tablet Take 40 mg by mouth daily. 05/12/20  Yes [provider]  fluticasone (FLONASE) 50 MCG/ACT nasal spray Place 2 sprays into both nostrils daily. 05/02/17  Yes Albertine Grates, MD  gabapentin (NEURONTIN) 600 MG tablet Take 1 tablet (600 mg total) by mouth at bedtime. Patient taking differently: Take 600 mg by mouth 3 (three) times daily. 05/01/17  Yes Albertine Grates, MD  metFORMIN (GLUCOPHAGE) 850 MG tablet Take 850 mg by mouth 2 (two) times daily. 05/21/20  Yes [provider]  metoprolol succinate (TOPROL-XL) 50 MG 24 hr tablet Take 75 mg by mouth daily. 05/21/20  Yes [provider]  Multiple Vitamins-Minerals (  CENTRUM SILVER PO) Take 1 capsule by mouth daily.   Yes [provider]  omeprazole (PRILOSEC) 20 MG capsule Take 20 mg by mouth daily. 05/21/20  Yes [provider]  pregabalin (LYRICA) 150 MG capsule Take 150 mg by mouth daily.   Yes [provider]  ramipril (ALTACE) 10 MG capsule Take 10 mg by mouth daily.    Yes [provider]  risperiDONE (RISPERDAL) 2 MG tablet Take 2 mg by mouth 2 (two) times daily.   Yes [provider]  SUMAtriptan (IMITREX) 100 MG tablet Take 100 mg by mouth every 12 (twelve) hours as needed. 05/21/20  Yes [provider]  tiotropium (SPIRIVA) 18 MCG inhalation capsule Place 1 capsule (18 mcg total) into  inhaler and inhale daily. 02/12/13  Yes Jeanella Craze, NP     Vitals:   05/30/20 0330 05/30/20 0400 05/30/20 0441 05/30/20 0506  BP: 128/67 132/78 121/61 132/74  Pulse: 61 (!) 58 63 63  Resp: 12 14 19  (!) 22  Temp:   98 F (36.7 C) 98.1 F (36.7 C)  TempSrc:   Oral Oral  SpO2: 92% 94% 93% 90%  Weight:    73.4 kg  Height:    5\' 3"  (1.6 m)   Exam Gen alert, no distress No rash, cyanosis or gangrene Sclera anicteric, throat clear  No jvd or bruits Chest clear bilat to bases, no rales/ wheezing RRR no MRG Abd soft ntnd no mass or ascites +bs GU normal MS no joint effusions or deformity Ext no LE or UE edema, no wounds or ulcers Neuro is alert, Ox 3 , nf       Home meds:  - asa 81/ lipitor 80  - risperdal 2 bid/ lytica 150 qd/ neurontin 600 tid/ buspar 5 bid  - spiriva 18ug qd/ prilosec 20/ pepcid 40  - metoprolol xl 75 qd/ ramipril 10 qd  - metformin 850 bid  - celebrex 200 bid prn  - prn's/ vitamins/ supplements    Na 116  K 4.6  CO2 28  BUN 9  Cr 0.79   Ca 9.8  Alb 3.7  LFT"s ok   Serum osm 24  UOsm 197   UNa < 10   CXR 4/22 - IMPRESSION: Probable small left pleural effusion. Segmental elevation of the right hemidiaphragm   UA turbid, many bact, >50 wbc, 0-5 rbc, 30 prot    BP 110- 135 / 65- 80  HR 90- 100  RR 13  RA 95%      Assessment/ Plan: 1. Hyponatremia - in the setting of dehydrating illness (n/v/d), acei and risperdal.  Urine studies suggest volume depletion. suspect hypovolemic hyponatremia. Would continue to give isotonic saline w/ q 4hr bmets/ K+ levels for now, hold acei until Na+ corrected and hold celebrex for now as well. Will follow.  2. HTN - holding acei/ ARB, other agents okay  3. DM - per pmd 4. Depression/ psychosis - cont meds 5. Vol depletion - reordered NS at 125 /hr and ordered 500 cc NS bolus.        MD 05/30/2020, 9:10 AM  Recent Labs  Lab 05/29/20 1848 05/30/20 0539  WBC 13.2* 8.6  HGB 13.5 12.6   Recent Labs   Lab 05/30/20 0129 05/30/20 0539  K 4.1 3.9  BUN 9 9  CREATININE 0.71 0.66  CALCIUM 9.6 9.5

## 2020-05-30 NOTE — Progress Notes (Addendum)
PROGRESS NOTE                                                                                                                                                                                                             Patient Demographics:    Alexandra Richardson, is a 70 y.o. female, DOB - 1950-10-18, QTM:[id number]  Outpatient Primary MD for the patient is Hamrick, Durward Fortes, MD    LOS - 0  Admit date - 05/29/2020    Chief Complaint  Patient presents with  . abnormal labs       Brief Narrative (HPI from H&P)  Alexandra Richardson is a 70 y.o. female with medical history significant of CVD, hyperlipidemia, hypertension, anxiety, depression, diabetes who had nausea vomiting and diarrhea multiple few days ago, she subsequently got her lab work done which showed that her sodium was low and she was sent to the hospital for further care.   Subjective:    Alexandra Richardson today has, No headache, No chest pain, No abdominal pain - No Nausea, No new weakness tingling or numbness, No SOB.   Assessment  & Plan :     1.  Severe Hyponatremia likely due to dehydration caused by nausea. On IVF, renal following, nausea vomiting better.  Monitor BMP closely.  2.  Hypomagnesemia.  Replaced IV.  3.  Reactionary leukocytosis.  Resolved.  Likely had viral gastroenteritis  4.  Anxiety and depression.  On home medications which will be gradually started once sodium is better, hyponatremia is likely due to dehydration and not SIADH.  5.  COPD.  Currently stable supportive care.  6.  Essential hypertension.  Placed on low-dose Norvasc.  7. DM Type II.  On sliding scale  Lab Results  Component Value Date   HGBA1C 5.6 04/30/2017   CBG (last 3)  Recent Labs    05/30/20 0604 05/30/20 0745 05/30/20 1209  GLUCAP 100* 118* 213*         Condition -   Guarded  Family Communication  :   Stephanie Coup [phone] 05/30/20 at 12:15 PM.   number not working call did not go through x2.  Code Status :  Full  Consults  :  Renal  PUD Prophylaxis :     Procedures  :  Disposition Plan  :    Status is: Inpatient  Remains inpatient appropriate because:IV treatments appropriate due to intensity of illness or inability to take PO   Dispo: The patient is from: Home              Anticipated d/c is to: Home              Patient currently is not medically stable to d/c.   Difficult to place patient No  DVT Prophylaxis  :    enoxaparin (LOVENOX) injection 40 mg Start: 05/30/20 1000   Lab Results  Component Value Date   PLT 264 05/30/2020    Diet :  Diet Order            DIET SOFT Room service appropriate? Yes; Fluid consistency: Thin  Diet effective now                  Inpatient Medications  Scheduled Meds: . aspirin EC  81 mg Oral Daily  . atorvastatin  80 mg Oral Daily  . enoxaparin (LOVENOX) injection  40 mg Subcutaneous Q24H  . famotidine  40 mg Oral Daily  . feeding supplement  237 mL Oral BID BM  . insulin aspart  0-9 Units Subcutaneous Q4H  . sodium chloride flush  3 mL Intravenous Q12H  . umeclidinium bromide  1 puff Inhalation Daily   Continuous Infusions: . sodium chloride 100 mL/hr at 05/30/20 1126  . sodium chloride    . magnesium sulfate bolus IVPB     PRN Meds:.acetaminophen **OR** [DISCONTINUED] acetaminophen, albuterol, SUMAtriptan  Antibiotics  :    Anti-infectives (From admission, onward)   None       Time Spent in minutes  30   Susa Raring M.D on 05/30/2020 at 12:13 PM  To page go to [email or web address]   [facility] -  Office  [phone]     See all Orders from today for further details    Objective:   Vitals:   05/30/20 0400 05/30/20 0441 05/30/20 0506 05/30/20 1002  BP: 132/78 121/61 132/74 (!) 153/71  Pulse: (!) 58 63 63 91  Resp: 14 19 (!) 22 18  Temp:  98 F (36.7 C) 98.1 F (36.7 C) 98.2 F (36.8 C)  TempSrc:  Oral Oral Oral   SpO2: 94% 93% 90%   Weight:   73.4 kg   Height:   5\' 3"  (1.6 m)     Wt Readings from Last 3 Encounters:  05/30/20 73.4 kg  12/10/18 83.3 kg  12/04/18 83.9 kg     Intake/Output Summary (Last 24 hours) at 05/30/2020 1213 Last data filed at 05/30/2020 0617 Gross per 24 hour  Intake 394.18 ml  Output 200 ml  Net 194.18 ml     Physical Exam  Awake Alert, No new F.N deficits, Normal affect [place].AT,PERRAL Supple Neck,No JVD, No cervical lymphadenopathy appriciated.  Symmetrical Chest wall movement, Good air movement bilaterally, CTAB RRR,No Gallops,Rubs or new Murmurs, No Parasternal Heave +ve B.Sounds, Abd Soft, No tenderness, No organomegaly appriciated, No rebound - guarding or rigidity. No Cyanosis, Clubbing or edema, No new Rash or bruise       Data Review:    CBC Recent Labs  Lab 05/29/20 1848 05/30/20 0539  WBC 13.2* 8.6  HGB 13.5 12.6  HCT 37.5 35.4*  PLT 320 264  MCV 88.2 87.8  MCH 31.8 31.3  MCHC 36.0 35.6  RDW 13.8 13.5  LYMPHSABS 4.1*  --   MONOABS 0.8  --  EOSABS 0.1  --   BASOSABS 0.1  --     Recent Labs  Lab 05/29/20 1848 05/30/20 0129 05/30/20 0539 05/30/20 1012  NA 116* 116* 118* 121*  K 4.6 4.1 3.9 3.9  CL 75* 77* 81* 83*  CO2 28 29 28 25   GLUCOSE 135* 116* 106* 169*  BUN 9 9 9  7*  CREATININE 0.79 0.71 0.66 0.67  CALCIUM 9.8 9.6 9.5 9.5  AST 21  --   --   --   ALT 15  --   --   --   ALKPHOS 18*  --   --   --   BILITOT 0.9  --   --   --   ALBUMIN 3.7  --   --   --   MG  --   --   --  1.6*  TSH  --  2.686  --   --   BNP  --   --   --  22.9    ------------------------------------------------------------------------------------------------------------------ No results for input(s): CHOL, HDL, LDLCALC, TRIG, CHOLHDL, LDLDIRECT in the last 72 hours.  Lab Results  Component Value Date   HGBA1C 5.6 04/30/2017    ------------------------------------------------------------------------------------------------------------------ Recent Labs    05/30/20 0129  TSH 2.686    Cardiac Enzymes No results for input(s): CKMB, TROPONINI, MYOGLOBIN in the last 168 hours.  Invalid input(s): CK ------------------------------------------------------------------------------------------------------------------    Component Value Date/Time   BNP 22.9 05/30/2020 1012    Micro Results Recent Results (from the past 240 hour(s))  SARS CORONAVIRUS 2 (TAT 6-24 HRS) Nasopharyngeal Nasopharyngeal Swab     Status: None   Collection Time: 05/30/20  2:04 AM   Specimen: Nasopharyngeal Swab  Result Value Ref Range Status   SARS Coronavirus 2 NEGATIVE NEGATIVE Final    Comment: (NOTE) SARS-CoV-2 target nucleic acids are NOT DETECTED.  The SARS-CoV-2 RNA is generally detectable in upper and lower respiratory specimens during the acute phase of infection. Negative results do not preclude SARS-CoV-2 infection, do not rule out co-infections with other pathogens, and should not be used as the sole basis for treatment or other patient management decisions. Negative results must be combined with clinical observations, patient history, and epidemiological information. The expected result is Negative.  Fact Sheet for Patients: 06/01/2020  Fact Sheet for Healthcare Providers: 06/01/20  This test is not yet approved or cleared by the [place] FDA and  has been authorized for detection and/or diagnosis of SARS-CoV-2 by FDA under an Emergency Use Authorization (EUA). This EUA will remain  in effect (meaning this test can be used) for the duration of the COVID-19 declaration under Se ction 564(b)(1) of the Act, 21 U.S.C. section 360bbb-3(b)(1), unless the authorization is terminated or revoked sooner.  Performed at [facility], [address], [place], [address] [place]     Radiology Reports DG CHEST PORT 1 VIEW  Result Date: 05/30/2020 CLINICAL DATA:  Leukocytosis.  History of COPD. EXAM: PORTABLE CHEST 1 VIEW COMPARISON:  01/21/2019 FINDINGS: Shallow inspiration. Segmental elevation of the right hemidiaphragm. Mild cardiac enlargement. Probable small left pleural effusion. Lungs are clear. No pneumothorax. Mediastinal contours appear intact. Calcification of the aorta. IMPRESSION: Probable small left pleural effusion. Segmental elevation of the right hemidiaphragm. Electronically Signed   By: 06/01/2020 M.D.   On: 05/30/2020 01:46

## 2020-05-30 NOTE — Plan of Care (Signed)
  Problem: Clinical Measurements: Goal: Ability to maintain clinical measurements within normal limits will improve Outcome: Progressing Goal: Diagnostic test results will improve Outcome: Progressing Goal: Respiratory complications will improve Outcome: Progressing Goal: Cardiovascular complication will be avoided Outcome: Progressing   Problem: Activity: Goal: Risk for activity intolerance will decrease Outcome: Progressing   

## 2020-05-31 LAB — GLUCOSE, CAPILLARY
Glucose-Capillary: 135 mg/dL — ABNORMAL HIGH (ref 70–99)
Glucose-Capillary: 137 mg/dL — ABNORMAL HIGH (ref 70–99)
Glucose-Capillary: 154 mg/dL — ABNORMAL HIGH (ref 70–99)
Glucose-Capillary: 162 mg/dL — ABNORMAL HIGH (ref 70–99)
Glucose-Capillary: 167 mg/dL — ABNORMAL HIGH (ref 70–99)
Glucose-Capillary: 220 mg/dL — ABNORMAL HIGH (ref 70–99)

## 2020-05-31 LAB — CBC WITH DIFFERENTIAL/PLATELET
Abs Immature Granulocytes: 0.05 10*3/uL (ref 0.00–0.07)
Basophils Absolute: 0.1 10*3/uL (ref 0.0–0.1)
Basophils Relative: 1 %
Eosinophils Absolute: 0 10*3/uL (ref 0.0–0.5)
Eosinophils Relative: 0 %
HCT: 34.7 % — ABNORMAL LOW (ref 36.0–46.0)
Hemoglobin: 11.9 g/dL — ABNORMAL LOW (ref 12.0–15.0)
Immature Granulocytes: 1 %
Lymphocytes Relative: 40 %
Lymphs Abs: 3.2 10*3/uL (ref 0.7–4.0)
MCH: 31 pg (ref 26.0–34.0)
MCHC: 34.3 g/dL (ref 30.0–36.0)
MCV: 90.4 fL (ref 80.0–100.0)
Monocytes Absolute: 0.8 10*3/uL (ref 0.1–1.0)
Monocytes Relative: 10 %
Neutro Abs: 3.9 10*3/uL (ref 1.7–7.7)
Neutrophils Relative %: 48 %
Platelets: 241 10*3/uL (ref 150–400)
RBC: 3.84 MIL/uL — ABNORMAL LOW (ref 3.87–5.11)
RDW: 13.5 % (ref 11.5–15.5)
WBC: 8.1 10*3/uL (ref 4.0–10.5)
nRBC: 0 % (ref 0.0–0.2)

## 2020-05-31 LAB — BASIC METABOLIC PANEL
Anion gap: 9 (ref 5–15)
BUN: 5 mg/dL — ABNORMAL LOW (ref 8–23)
CO2: 27 mmol/L (ref 22–32)
Calcium: 9.3 mg/dL (ref 8.9–10.3)
Chloride: 91 mmol/L — ABNORMAL LOW (ref 98–111)
Creatinine, Ser: 0.78 mg/dL (ref 0.44–1.00)
GFR, Estimated: >60 mL/min (ref >60)
Glucose, Bld: 138 mg/dL — ABNORMAL HIGH (ref 70–99)
Potassium: 3.9 mmol/L (ref 3.5–5.1)
Sodium: 127 mmol/L — ABNORMAL LOW (ref 135–145)

## 2020-05-31 LAB — COMPREHENSIVE METABOLIC PANEL
ALT: 15 U/L (ref 0–44)
AST: 19 U/L (ref 15–41)
Albumin: 3.3 g/dL — ABNORMAL LOW (ref 3.5–5.0)
Alkaline Phosphatase: 20 U/L — ABNORMAL LOW (ref 38–126)
Anion gap: 9 (ref 5–15)
BUN: 7 mg/dL — ABNORMAL LOW (ref 8–23)
CO2: 26 mmol/L (ref 22–32)
Calcium: 9.2 mg/dL (ref 8.9–10.3)
Chloride: 93 mmol/L — ABNORMAL LOW (ref 98–111)
Creatinine, Ser: 0.75 mg/dL (ref 0.44–1.00)
GFR, Estimated: >60 mL/min (ref >60)
Glucose, Bld: 146 mg/dL — ABNORMAL HIGH (ref 70–99)
Potassium: 4.1 mmol/L (ref 3.5–5.1)
Sodium: 128 mmol/L — ABNORMAL LOW (ref 135–145)
Total Bilirubin: 0.4 mg/dL (ref 0.3–1.2)
Total Protein: 6.3 g/dL — ABNORMAL LOW (ref 6.5–8.1)

## 2020-05-31 LAB — BRAIN NATRIURETIC PEPTIDE: B Natriuretic Peptide: 86.1 pg/mL (ref 0.0–100.0)

## 2020-05-31 LAB — MAGNESIUM: Magnesium: 1.8 mg/dL (ref 1.7–2.4)

## 2020-05-31 MED ORDER — SODIUM CHLORIDE 0.9 % IV SOLN: INTRAVENOUS | Status: DC

## 2020-05-31 MED ORDER — METOPROLOL TARTRATE 50 MG PO TABS
50.0000 mg | ORAL_TABLET | Freq: Two times a day (BID) | ORAL | Status: DC
  Administered 2020-05-31 – 2020-06-02 (×5): 50 mg via ORAL
  Filled 2020-05-31 (×5): qty 1

## 2020-05-31 NOTE — Evaluation (Signed)
Physical Therapy Evaluation Patient Details Name: Alexandra Richardson MRN: [id number] DOB: 1950-05-10 Today's Date: 05/31/2020   History of Present Illness  70 y.o. female presents to [facility] ED on 05/29/2020, referred by home health nurse for abnormal lab values. Pt reports nausea with vomiting and diarrhea over the past few days. Pt admitted for hyponatremia. PMH includes anxiety, arthritis, depression, HLD, HTN.  Clinical Impression  Pt reports to PT that she has not been able to leave her home since Christmas due to the inability to navigate stairs. Pt demonstrates ability to ambulate limited community distances without physical assistance. When asked about stairs, pt reports that she has been too weak to attempt and fears falling. Pt shows capacity to navigate stairs with some assistance and with use of railing. Pt reports she does not have reliable railings at home to utilize. Pt will benefit from acute PT to address LE weakness, instability, endurance, and activity tolerance for improved independent mobility. Pt will benefit from use of railing to provide stability and allow for independent mobility to access her home and community. If railings are unable to be installed, pt will benefit from use of a ramp as current railings are unreliable and a safety risk.      Follow Up Recommendations Home health PT    Equipment Recommendations  Other (comment) (ramp or railing)    Recommendations for Other Services       Precautions / Restrictions Precautions Precautions: Fall Restrictions Weight Bearing Restrictions: No      Mobility  Bed Mobility Overal bed mobility: Independent                  Transfers Overall transfer level: Needs assistance Equipment used: Rolling walker (2 wheeled) Transfers: Sit to/from Stand Sit to Stand: Min guard         General transfer comment: min G for safety.  Ambulation/Gait Ambulation/Gait assistance: Min guard Gait Distance (Feet): 400  Feet Assistive device: Rolling walker (2 wheeled) Gait Pattern/deviations: Step-through pattern;Decreased stride length;Trunk flexed Gait velocity: reduced Gait velocity interpretation: 1.31 - 2.62 ft/sec, indicative of limited community ambulator General Gait Details: Pt fatigues during gait requesting to return to room.  Stairs Stairs: Yes Stairs assistance: Min assist Stair Management: Two rails;Step to pattern;Sideways Number of Stairs: 4 General stair comments: Pt uses R handrail with BUE, sideways for stair ascent and L handrail with BUE for stair descent. Pt nervous to negotiate stairs, but agreeable with encouragement as she has stairs to manage to enter home.  Wheelchair Mobility    Modified Rankin (Stroke Patients Only)       Balance Overall balance assessment: Needs assistance Sitting-balance support: Feet supported Sitting balance-Leahy Scale: Good     Standing balance support: Bilateral upper extremity supported;No upper extremity supported;During functional activity Standing balance-Leahy Scale: Fair Standing balance comment: Pt is able to don facemask when standing and removes BUE from RW.                             Pertinent Vitals/Pain Pain Assessment: 0-10 Pain Score: 5  Pain Location: bilateral LE and back Pain Descriptors / Indicators: Grimacing Pain Intervention(s): Limited activity within patient's tolerance;Monitored during session    Home Living Family/patient expects to be discharged to:: Private residence Living Arrangements: Alone Available Help at Discharge: Family;Available PRN/intermittently (Sister provides help with laundry and provides groceries, can't provide much physical assistance otherwise.) Type of Home: Mobile home Home Access: Stairs to enter  Entrance Stairs-Rails: Can reach both Entrance Stairs-Number of Steps: 4-5 Home Layout: One level Home Equipment: Walker - 2 wheels Additional Comments: Pt reports  rails/stairs are in poor condition, would like a ramp access. Pt reports inability to use her tub shower, bathes at sink.    Prior Function Level of Independence: Independent with assistive device(s)         Comments: Pt uses RW to access home.     Hand Dominance        Extremity/Trunk Assessment   Upper Extremity Assessment Upper Extremity Assessment: Defer to OT evaluation    Lower Extremity Assessment Lower Extremity Assessment: Generalized weakness    Cervical / Trunk Assessment Cervical / Trunk Assessment: Kyphotic  Communication   Communication: No difficulties  Cognition Arousal/Alertness: Awake/alert Behavior During Therapy: WFL for tasks assessed/performed Overall Cognitive Status: Within Functional Limits for tasks assessed                                        General Comments General comments (skin integrity, edema, etc.): Pt reports she has not left her home since christmas due to inability to negotiate stairs leading into/out of home. Pt reports railings and stairs to enter are in poor condition and she does not want to risk a fall.    Exercises     Assessment/Plan    PT Assessment Patient needs continued PT services  PT Problem List Decreased strength;Decreased activity tolerance;Decreased mobility;Pain;Decreased balance       PT Treatment Interventions Stair training;Gait training;Functional mobility training;Therapeutic activities;Therapeutic exercise;Balance training;Patient/family education    PT Goals (Current goals can be found in the Care Plan section)  Acute Rehab PT Goals Patient Stated Goal: Independently navigate getting into and out of home. PT Goal Formulation: With patient Time For Goal Achievement: 06/14/20 Potential to Achieve Goals: Good Additional Goals Additional Goal #1: Pt will independently ambulate > 125 feet with DOE no greater than 2/4.    Frequency Min 3X/week   Barriers to discharge         Co-evaluation               AM-PAC PT "6 Clicks" Mobility  Outcome Measure Help needed turning from your back to your side while in a flat bed without using bedrails?: None Help needed moving from lying on your back to sitting on the side of a flat bed without using bedrails?: None Help needed moving to and from a bed to a chair (including a wheelchair)?: A Little Help needed standing up from a chair using your arms (e.g., wheelchair or bedside chair)?: A Little Help needed to walk in hospital room?: A Little Help needed climbing 3-5 steps with a railing? : A Little 6 Click Score: 20    End of Session Equipment Utilized During Treatment: Gait belt Activity Tolerance: Patient limited by fatigue;No increased pain Patient left: in bed;with call bell/phone within reach Nurse Communication: Mobility status PT Visit Diagnosis: Other abnormalities of gait and mobility (R26.89);Muscle weakness (generalized) (M62.81);Difficulty in walking, not elsewhere classified (R26.2);Pain;Unsteadiness on feet (R26.81) Pain - Right/Left:  (bilateral LE and back) Pain - part of body: Leg (and back)    Time: 2376-2831 PT Time Calculation (min) (ACUTE ONLY): 21 min   Charges:   PT Evaluation $PT Eval Low Complexity: 1 Low          Acute Rehab  Pager: [phone]  Waldemar Dickens, SPT  05/31/2020, 1:13 PM

## 2020-05-31 NOTE — Progress Notes (Signed)
[facility] Progress Note  Subjective: Na up to 128 today, pt feeling good, no new c/o.   Vitals:   05/30/20 1951 05/31/20 0038 05/31/20 0329 05/31/20 0754  BP: (!) 151/80 137/64 (!) 157/73   Pulse: (!) 110 (!) 105 (!) 109   Resp: 20 17 16    Temp: 98.5 F (36.9 C) 98.5 F (36.9 C) 98.5 F (36.9 C)   TempSrc: Oral Oral Oral   SpO2: 91% 91% 91% 94%  Weight:   74.4 kg   Height:        Exam: Gen alert, no distress No rash, cyanosis or gangrene Sclera anicteric, throat clear  No jvd or bruits Chest clear bilat to bases, no rales/ wheezing RRR no MRG Abd soft ntnd no mass or ascites +bs GU normal MS no joint effusions or deformity Ext no LE or UE edema, no wounds or ulcers Neuro is alert, Ox 3 , nf    Home meds:  - asa 81/ lipitor 80  - risperdal 2 bid/ lytica 150 qd/ neurontin 600 tid/ buspar 5 bid  - spiriva 18ug qd/ prilosec 20/ pepcid 40  - metoprolol xl 75 qd/ ramipril 10 qd  - metformin 850 bid  - celebrex 200 bid prn  - prn's/ vitamins/ supplements      Serum osm 24  UOsm 197   UNa < 10   CXR 4/22 - IMPRESSION: Probable small left pleural effusion. Segmental elevation of the right hemidiaphragm   UA turbid, many bact, >50 wbc, 0-5 rbc, 30 prot   Assessment/ Plan: 1. Hyponatremia - in the setting of dehydrating illness (n/v/d), acei and risperdal.  Urine studies suggest volume depletion. Suspect hypovolemic hyponatremia due to acute GI illness. With normal saline / vol loading Na + has mostly corrected up to 128 today. Would continue to hold acei/ celebrex for another 1-2 weeks. No further suggestions. Will sign off.  2. HTN - holding acei/ ARB, other agents for another 1-2 wks 3. DM - per pmd 4. Depression/ psychosis - cont meds 5. Vol depletion - resolved   Rob Pranish Akhavan 05/31/2020, 11:56 AM   Recent Labs  Lab 05/30/20 0539 05/30/20 1012 05/31/20 0118 05/31/20 0650  K 3.9   < > 3.9 4.1  BUN 9   < > 5* 7*  CREATININE 0.66   < > 0.78  0.75  CALCIUM 9.5   < > 9.3 9.2  HGB 12.6  --  11.9*  --    < > = values in this interval not displayed.   Inpatient medications: . aspirin EC  81 mg Oral Daily  . atorvastatin  80 mg Oral Daily  . enoxaparin (LOVENOX) injection  40 mg Subcutaneous Q24H  . famotidine  40 mg Oral Daily  . feeding supplement  237 mL Oral BID BM  . insulin aspart  0-9 Units Subcutaneous Q4H  . metoprolol tartrate  50 mg Oral BID  . umeclidinium bromide  1 puff Inhalation Daily   . sodium chloride     acetaminophen **OR** [DISCONTINUED] acetaminophen, albuterol, SUMAtriptan

## 2020-05-31 NOTE — Care Management (Addendum)
1349 05-31-20 Case Manager received a secure chat regarding patient needing assistance with resources for someone to repair steps outside and to install a ramp. Patient states she has a rolling walker that she uses. Patient has her medications delivered to the home via mail order, and her sister picks up groceries and delivers them to the patient. Patient also states that her PCP has made a home visit in the past. Case Manager provided patient with information on aging gracefully- they have volunteers that can assist with home repairs. Patient to call to see if they can assist- she lives outside of [facility]. Case Manager also asked the patient to call the Department of Social Services in her county to see if they can assist via her Medicaid benefit. Patient has a blue banner for [facility]- Case Manager will reach out to the [facility] Liaison to see if the patient is active with the services and if the community case manager can assist with resources. Case Manager will continue to follow for additional toc needs. Gala Lewandowsky, RN,BSN Case Manager   1405 05-31-20 [facility] consult submitted via Epic to see if they can assist with resources in the community.

## 2020-05-31 NOTE — Progress Notes (Signed)
PROGRESS NOTE                                                                                                                                                                                                             Patient Demographics:    Alexandra Richardson, is a 70 y.o. female, DOB - 07-23-1950, ZOX:[id number]  Outpatient Primary MD for the patient is Hamrick, Durward Fortes, MD    LOS - 1  Admit date - 05/29/2020    Chief Complaint  Patient presents with  . abnormal labs       Brief Narrative (HPI from H&P)  Alexandra Richardson is a 70 y.o. female with medical history significant of CVD, hyperlipidemia, hypertension, anxiety, depression, diabetes who had nausea vomiting and diarrhea multiple few days ago, she subsequently got her lab work done which showed that her sodium was low and she was sent to the hospital for further care.   Subjective:   Patient in bed, appears comfortable, denies any headache, no fever, no chest pain or pressure, no shortness of breath , no abdominal pain. No focal weakness.   Assessment  & Plan :     1.  Severe Hyponatremia likely due to dehydration caused by viral gastroenteritis.  Gastroenteritis has resolved with supportive care, continue gentle IV fluids, dehydration and hyponatremia improved, likely discharge on 06/01/2020 with better.  2.  Hypomagnesemia.  Replaced IV.  3.  Reactionary leukocytosis.  Resolved.  Likely had viral gastroenteritis  4.  Anxiety and depression.  On home medications which will be gradually started once sodium is better, hyponatremia is likely due to dehydration and not SIADH.  5.  COPD.  Currently stable supportive care.  6.  Essential hypertension.  Placed on beta-blocker.  Monitor.  7. DM Type II.  On sliding scale  Lab Results  Component Value Date   HGBA1C 5.6 04/30/2017   CBG (last 3)  Recent Labs    05/31/20 0031 05/31/20 0334 05/31/20 0751   GLUCAP 154* 135* 162*         Condition -   Guarded  Family Communication  :   Stephanie Coup [phone] 05/30/20 at 12:15 PM.  number not working call did not go through x2.  Code Status :  Full  Consults  :  Renal  PUD Prophylaxis :  Procedures  :            Disposition Plan  :    Status is: Inpatient  Remains inpatient appropriate because:IV treatments appropriate due to intensity of illness or inability to take PO   Dispo: The patient is from: Home              Anticipated d/c is to: Home              Patient currently is not medically stable to d/c.   Difficult to place patient No  DVT Prophylaxis  :    enoxaparin (LOVENOX) injection 40 mg Start: 05/30/20 1000   Lab Results  Component Value Date   PLT 241 05/31/2020    Diet :  Diet Order            DIET SOFT Room service appropriate? Yes; Fluid consistency: Thin  Diet effective now                  Inpatient Medications  Scheduled Meds: . aspirin EC  81 mg Oral Daily  . atorvastatin  80 mg Oral Daily  . enoxaparin (LOVENOX) injection  40 mg Subcutaneous Q24H  . famotidine  40 mg Oral Daily  . feeding supplement  237 mL Oral BID BM  . insulin aspart  0-9 Units Subcutaneous Q4H  . metoprolol tartrate  50 mg Oral BID  . umeclidinium bromide  1 puff Inhalation Daily   Continuous Infusions: . sodium chloride     PRN Meds:.acetaminophen **OR** [DISCONTINUED] acetaminophen, albuterol, SUMAtriptan  Antibiotics  :    Anti-infectives (From admission, onward)   None       Time Spent in minutes  30   Susa Raring M.D on 05/31/2020 at 10:49 AM  To page go to [email or web address]   [facility] -  Office  [phone]     See all Orders from today for further details    Objective:   Vitals:   05/30/20 1951 05/31/20 0038 05/31/20 0329 05/31/20 0754  BP: (!) 151/80 137/64 (!) 157/73   Pulse: (!) 110 (!) 105 (!) 109   Resp: 20 17 16    Temp: 98.5 F (36.9 C) 98.5 F (36.9 C)  98.5 F (36.9 C)   TempSrc: Oral Oral Oral   SpO2: 91% 91% 91% 94%  Weight:   74.4 kg   Height:        Wt Readings from Last 3 Encounters:  05/31/20 74.4 kg  12/10/18 83.3 kg  12/04/18 83.9 kg     Intake/Output Summary (Last 24 hours) at 05/31/2020 1049 Last data filed at 05/30/2020 1945 Gross per 24 hour  Intake 1776.67 ml  Output 1550 ml  Net 226.67 ml     Physical Exam  Awake Alert, No new F.N deficits, Normal affect [place].AT,PERRAL Supple Neck,No JVD, No cervical lymphadenopathy appriciated.  Symmetrical Chest wall movement, Good air movement bilaterally, CTAB RRR,No Gallops, Rubs or new Murmurs, No Parasternal Heave +ve B.Sounds, Abd Soft, No tenderness, No organomegaly appriciated, No rebound - guarding or rigidity. No Cyanosis, Clubbing or edema, No new Rash or bruise     Data Review:    CBC Recent Labs  Lab 05/29/20 1848 05/30/20 0539 05/31/20 0118  WBC 13.2* 8.6 8.1  HGB 13.5 12.6 11.9*  HCT 37.5 35.4* 34.7*  PLT 320 264 241  MCV 88.2 87.8 90.4  MCH 31.8 31.3 31.0  MCHC 36.0 35.6 34.3  RDW 13.8 13.5 13.5  LYMPHSABS 4.1*  --  3.2  MONOABS 0.8  --  0.8  EOSABS 0.1  --  0.0  BASOSABS 0.1  --  0.1    Recent Labs  Lab 05/29/20 1848 05/30/20 0129 05/30/20 0539 05/30/20 1012 05/30/20 1347 05/30/20 2043 05/31/20 0118 05/31/20 0650  NA 116* 116*   < > 121* 121* 126* 127* 128*  K 4.6 4.1   < > 3.9 4.2 4.0 3.9 4.1  CL 75* 77*   < > 83* 86* 90* 91* 93*  CO2 28 29   < > 25 24 25 27 26   GLUCOSE 135* 116*   < > 169* 110* 148* 138* 146*  BUN 9 9   < > 7* 6* 6* 5* 7*  CREATININE 0.79 0.71   < > 0.67 0.65 0.74 0.78 0.75  CALCIUM 9.8 9.6   < > 9.5 9.1 9.2 9.3 9.2  AST 21  --   --   --   --   --   --  19  ALT 15  --   --   --   --   --   --  15  ALKPHOS 18*  --   --   --   --   --   --  20*  BILITOT 0.9  --   --   --   --   --   --  0.4  ALBUMIN 3.7  --   --   --   --   --   --  3.3*  MG  --   --   --  1.6*  --   --  1.8  --   TSH  --  2.686  --   --    --   --   --   --   BNP  --   --   --  22.9  --   --  86.1  --    < > = values in this interval not displayed.    ------------------------------------------------------------------------------------------------------------------ No results for input(s): CHOL, HDL, LDLCALC, TRIG, CHOLHDL, LDLDIRECT in the last 72 hours.  Lab Results  Component Value Date   HGBA1C 5.6 04/30/2017   ------------------------------------------------------------------------------------------------------------------ Recent Labs    05/30/20 0129  TSH 2.686    Cardiac Enzymes No results for input(s): CKMB, TROPONINI, MYOGLOBIN in the last 168 hours.  Invalid input(s): CK ------------------------------------------------------------------------------------------------------------------    Component Value Date/Time   BNP 86.1 05/31/2020 0118    Micro Results Recent Results (from the past 240 hour(s))  SARS CORONAVIRUS 2 (TAT 6-24 HRS) Nasopharyngeal Nasopharyngeal Swab     Status: None   Collection Time: 05/30/20  2:04 AM   Specimen: Nasopharyngeal Swab  Result Value Ref Range Status   SARS Coronavirus 2 NEGATIVE NEGATIVE Final    Comment: (NOTE) SARS-CoV-2 target nucleic acids are NOT DETECTED.  The SARS-CoV-2 RNA is generally detectable in upper and lower respiratory specimens during the acute phase of infection. Negative results do not preclude SARS-CoV-2 infection, do not rule out co-infections with other pathogens, and should not be used as the sole basis for treatment or other patient management decisions. Negative results must be combined with clinical observations, patient history, and epidemiological information. The expected result is Negative.  Fact Sheet for Patients: HairSlick.nohttps://www.fda.gov/media/138098/download  Fact Sheet for Healthcare Providers: quierodirigir.comhttps://www.fda.gov/media/138095/download  This test is not yet approved or cleared by the [place] FDA and  has been authorized  for detection and/or diagnosis of SARS-CoV-2 by FDA under an Emergency Use Authorization (EUA). This EUA will  remain  in effect (meaning this test can be used) for the duration of the COVID-19 declaration under Se ction 564(b)(1) of the Act, 21 U.S.C. section 360bbb-3(b)(1), unless the authorization is terminated or revoked sooner.  Performed at [facility], [address] [place], [place] [address]     Radiology Reports DG CHEST PORT 1 VIEW  Result Date: 05/30/2020 CLINICAL DATA:  Leukocytosis.  History of COPD. EXAM: PORTABLE CHEST 1 VIEW COMPARISON:  01/21/2019 FINDINGS: Shallow inspiration. Segmental elevation of the right hemidiaphragm. Mild cardiac enlargement. Probable small left pleural effusion. Lungs are clear. No pneumothorax. Mediastinal contours appear intact. Calcification of the aorta. IMPRESSION: Probable small left pleural effusion. Segmental elevation of the right hemidiaphragm. Electronically Signed   By: Burman Nieves M.D.   On: 05/30/2020 01:46

## 2020-06-01 ENCOUNTER — Inpatient Hospital Stay (HOSPITAL_COMMUNITY): Payer: Medicare Other

## 2020-06-01 LAB — CBC WITH DIFFERENTIAL/PLATELET
Abs Immature Granulocytes: 0.06 10*3/uL (ref 0.00–0.07)
Basophils Absolute: 0.1 10*3/uL (ref 0.0–0.1)
Basophils Relative: 1 %
Eosinophils Absolute: 0.1 10*3/uL (ref 0.0–0.5)
Eosinophils Relative: 1 %
HCT: 33.6 % — ABNORMAL LOW (ref 36.0–46.0)
Hemoglobin: 11.4 g/dL — ABNORMAL LOW (ref 12.0–15.0)
Immature Granulocytes: 1 %
Lymphocytes Relative: 38 %
Lymphs Abs: 3.2 10*3/uL (ref 0.7–4.0)
MCH: 31.3 pg (ref 26.0–34.0)
MCHC: 33.9 g/dL (ref 30.0–36.0)
MCV: 92.3 fL (ref 80.0–100.0)
Monocytes Absolute: 0.8 10*3/uL (ref 0.1–1.0)
Monocytes Relative: 10 %
Neutro Abs: 4.1 10*3/uL (ref 1.7–7.7)
Neutrophils Relative %: 49 %
Platelets: 230 10*3/uL (ref 150–400)
RBC: 3.64 MIL/uL — ABNORMAL LOW (ref 3.87–5.11)
RDW: 13.4 % (ref 11.5–15.5)
WBC: 8.3 10*3/uL (ref 4.0–10.5)
nRBC: 0 % (ref 0.0–0.2)

## 2020-06-01 LAB — BRAIN NATRIURETIC PEPTIDE: B Natriuretic Peptide: 67.5 pg/mL (ref 0.0–100.0)

## 2020-06-01 LAB — GLUCOSE, CAPILLARY
Glucose-Capillary: 134 mg/dL — ABNORMAL HIGH (ref 70–99)
Glucose-Capillary: 136 mg/dL — ABNORMAL HIGH (ref 70–99)
Glucose-Capillary: 143 mg/dL — ABNORMAL HIGH (ref 70–99)
Glucose-Capillary: 177 mg/dL — ABNORMAL HIGH (ref 70–99)
Glucose-Capillary: 194 mg/dL — ABNORMAL HIGH (ref 70–99)
Glucose-Capillary: 201 mg/dL — ABNORMAL HIGH (ref 70–99)

## 2020-06-01 LAB — BASIC METABOLIC PANEL
Anion gap: 13 (ref 5–15)
BUN: 8 mg/dL (ref 8–23)
CO2: 26 mmol/L (ref 22–32)
Calcium: 9.3 mg/dL (ref 8.9–10.3)
Chloride: 86 mmol/L — ABNORMAL LOW (ref 98–111)
Creatinine, Ser: 0.6 mg/dL (ref 0.44–1.00)
GFR, Estimated: >60 mL/min (ref >60)
Glucose, Bld: 131 mg/dL — ABNORMAL HIGH (ref 70–99)
Potassium: 3.5 mmol/L (ref 3.5–5.1)
Sodium: 125 mmol/L — ABNORMAL LOW (ref 135–145)

## 2020-06-01 LAB — COMPREHENSIVE METABOLIC PANEL
ALT: 14 U/L (ref 0–44)
AST: 18 U/L (ref 15–41)
Albumin: 3.1 g/dL — ABNORMAL LOW (ref 3.5–5.0)
Alkaline Phosphatase: 16 U/L — ABNORMAL LOW (ref 38–126)
Anion gap: 8 (ref 5–15)
BUN: 7 mg/dL — ABNORMAL LOW (ref 8–23)
CO2: 25 mmol/L (ref 22–32)
Calcium: 9.3 mg/dL (ref 8.9–10.3)
Chloride: 93 mmol/L — ABNORMAL LOW (ref 98–111)
Creatinine, Ser: 0.65 mg/dL (ref 0.44–1.00)
GFR, Estimated: >60 mL/min (ref >60)
Glucose, Bld: 133 mg/dL — ABNORMAL HIGH (ref 70–99)
Potassium: 3.7 mmol/L (ref 3.5–5.1)
Sodium: 126 mmol/L — ABNORMAL LOW (ref 135–145)
Total Bilirubin: 0.6 mg/dL (ref 0.3–1.2)
Total Protein: 6.3 g/dL — ABNORMAL LOW (ref 6.5–8.1)

## 2020-06-01 LAB — MAGNESIUM: Magnesium: 1.3 mg/dL — ABNORMAL LOW (ref 1.7–2.4)

## 2020-06-01 MED ORDER — TOLVAPTAN 15 MG PO TABS
15.0000 mg | ORAL_TABLET | Freq: Once | ORAL | Status: AC
  Administered 2020-06-01: 15 mg via ORAL
  Filled 2020-06-01: qty 1

## 2020-06-01 MED ORDER — HYDRALAZINE HCL 50 MG PO TABS
50.0000 mg | ORAL_TABLET | Freq: Three times a day (TID) | ORAL | Status: DC
  Administered 2020-06-01 – 2020-06-02 (×4): 50 mg via ORAL
  Filled 2020-06-01 (×4): qty 1

## 2020-06-01 MED ORDER — POTASSIUM CHLORIDE CRYS ER 20 MEQ PO TBCR
40.0000 meq | EXTENDED_RELEASE_TABLET | Freq: Once | ORAL | Status: AC
  Administered 2020-06-01: 40 meq via ORAL
  Filled 2020-06-01: qty 2

## 2020-06-01 MED ORDER — CLONAZEPAM 0.25 MG PO TBDP
0.5000 mg | ORAL_TABLET | Freq: Two times a day (BID) | ORAL | Status: DC | PRN
  Administered 2020-06-01 – 2020-06-02 (×3): 0.5 mg via ORAL
  Filled 2020-06-01 (×3): qty 2

## 2020-06-01 MED ORDER — POTASSIUM CHLORIDE CRYS ER 20 MEQ PO TBCR
20.0000 meq | EXTENDED_RELEASE_TABLET | Freq: Once | ORAL | Status: AC
  Administered 2020-06-01: 20 meq via ORAL
  Filled 2020-06-01: qty 1

## 2020-06-01 MED ORDER — MAGNESIUM SULFATE IN D5W 1-5 GM/100ML-% IV SOLN
1.0000 g | Freq: Once | INTRAVENOUS | Status: AC
  Administered 2020-06-01: 1 g via INTRAVENOUS
  Filled 2020-06-01: qty 100

## 2020-06-01 MED ORDER — MAGNESIUM SULFATE 2 GM/50ML IV SOLN
2.0000 g | Freq: Once | INTRAVENOUS | Status: AC
  Administered 2020-06-01: 2 g via INTRAVENOUS
  Filled 2020-06-01: qty 50

## 2020-06-01 MED ORDER — FUROSEMIDE 40 MG PO TABS
40.0000 mg | ORAL_TABLET | Freq: Once | ORAL | Status: AC
  Administered 2020-06-01: 40 mg via ORAL
  Filled 2020-06-01: qty 1

## 2020-06-01 NOTE — Progress Notes (Signed)
PROGRESS NOTE                                                                                                                                                                                                             Patient Demographics:    Alexandra Richardson, is a 70 y.o. female, DOB - 10/07/1950, NOB:[id number]  Outpatient Primary MD for the patient is Hamrick, Durward Fortes, MD    LOS - 2  Admit date - 05/29/2020    Chief Complaint  Patient presents with  . abnormal labs       Brief Narrative (HPI from H&P)  Alexandra Richardson is a 70 y.o. female with medical history significant of CVD, hyperlipidemia, hypertension, anxiety, depression, diabetes who had nausea vomiting and diarrhea multiple few days ago, she subsequently got her lab work done which showed that her sodium was low and she was sent to the hospital for further care.   Subjective:   Patient in bed, appears comfortable, denies any headache, no fever, no chest pain or pressure, no shortness of breath , no abdominal pain. No new focal weakness.    Assessment  & Plan :     1.  Severe Hyponatremia likely due to dehydration caused by viral gastroenteritis.  Gastroenteritis has resolved with supportive care, she was treated with IV fluids and sodium came up nicely over the first 36 hours but now has started to drop again and she is developing some crackles, question if she has developed mild fluid overload versus SIADH, hold further IV fluids gently trial of Lasix on 06/01/2020 and monitor.  2.  Hypomagnesemia.  Replaced IV again on 06/01/2020.  3.  Reactionary leukocytosis.  Resolved.  Likely had viral gastroenteritis  4.  Anxiety and depression.  On home medications which will be gradually started once sodium is better, possibly after discharge as they might add an element of SIADH to her hyponatremia etiology.  5.  COPD.  Currently stable supportive care.  6.   Essential hypertension.  Placed on beta-blocker, added hydralazine for better control.  Monitor.  7. DM Type II.  On sliding scale  Lab Results  Component Value Date   HGBA1C 5.6 04/30/2017   CBG (last 3)  Recent Labs    06/01/20 0004 06/01/20 0433 06/01/20 0759  GLUCAP 134* 136* 143*  Condition -   Guarded  Family Communication  :   Stephanie Coup [phone] 05/30/20 at 12:15 PM.  number not working call did not go through x2.  Code Status :  Full  Consults  :  Renal  PUD Prophylaxis :     Procedures  :            Disposition Plan  :    Status is: Inpatient  Remains inpatient appropriate because:IV treatments appropriate due to intensity of illness or inability to take PO   Dispo: The patient is from: Home              Anticipated d/c is to: Home              Patient currently is not medically stable to d/c.   Difficult to place patient No  DVT Prophylaxis  :    enoxaparin (LOVENOX) injection 40 mg Start: 05/30/20 1000   Lab Results  Component Value Date   PLT 230 06/01/2020    Diet :  Diet Order            DIET SOFT Room service appropriate? Yes; Fluid consistency: Thin  Diet effective now                  Inpatient Medications  Scheduled Meds: . aspirin EC  81 mg Oral Daily  . atorvastatin  80 mg Oral Daily  . enoxaparin (LOVENOX) injection  40 mg Subcutaneous Q24H  . famotidine  40 mg Oral Daily  . feeding supplement  237 mL Oral BID BM  . hydrALAZINE  50 mg Oral Q8H  . insulin aspart  0-9 Units Subcutaneous Q4H  . metoprolol tartrate  50 mg Oral BID  . umeclidinium bromide  1 puff Inhalation Daily   Continuous Infusions: . magnesium sulfate bolus IVPB     PRN Meds:.acetaminophen **OR** [DISCONTINUED] acetaminophen, albuterol, SUMAtriptan  Antibiotics  :    Anti-infectives (From admission, onward)   None       Time Spent in minutes  30   Susa Raring M.D on 06/01/2020 at 10:24 AM  To page go to [email or web address]    [facility] -  Office  [phone]  See all Orders from today for further details    Objective:   Vitals:   05/31/20 2126 06/01/20 0009 06/01/20 0438 06/01/20 0804  BP: (!) 158/83 (!) 160/75 (!) 165/85 (!) 160/84  Pulse: 79 67 71 66  Resp:  18 18 18   Temp:  97.9 F (36.6 C) 98.6 F (37 C) 98.6 F (37 C)  TempSrc:  Oral Oral Oral  SpO2:  92% 93% 96%  Weight:   75.6 kg   Height:        Wt Readings from Last 3 Encounters:  06/01/20 75.6 kg  12/10/18 83.3 kg  12/04/18 83.9 kg     Intake/Output Summary (Last 24 hours) at 06/01/2020 1024 Last data filed at 06/01/2020 0900 Gross per 24 hour  Intake 1766.74 ml  Output --  Net 1766.74 ml     Physical Exam  Awake Alert, No new F.N deficits, Normal affect [place].AT,PERRAL Supple Neck,No JVD, No cervical lymphadenopathy appriciated.  Symmetrical Chest wall movement, Good air movement bilaterally, few rales RRR,No Gallops, Rubs or new Murmurs, No Parasternal Heave +ve B.Sounds, Abd Soft, No tenderness, No organomegaly appriciated, No rebound - guarding or rigidity. No Cyanosis, Clubbing or edema, No new Rash or bruise    Data Review:    CBC Recent Labs  Lab 05/29/20 1848 05/30/20 0539 05/31/20 0118 06/01/20 0256  WBC 13.2* 8.6 8.1 8.3  HGB 13.5 12.6 11.9* 11.4*  HCT 37.5 35.4* 34.7* 33.6*  PLT 320 264 241 230  MCV 88.2 87.8 90.4 92.3  MCH 31.8 31.3 31.0 31.3  MCHC 36.0 35.6 34.3 33.9  RDW 13.8 13.5 13.5 13.4  LYMPHSABS 4.1*  --  3.2 3.2  MONOABS 0.8  --  0.8 0.8  EOSABS 0.1  --  0.0 0.1  BASOSABS 0.1  --  0.1 0.1    Recent Labs  Lab 05/29/20 1848 05/30/20 0129 05/30/20 0539 05/30/20 1012 05/30/20 1347 05/30/20 2043 05/31/20 0118 05/31/20 0650 06/01/20 0256  NA 116* 116*   < > 121* 121* 126* 127* 128* 126*  K 4.6 4.1   < > 3.9 4.2 4.0 3.9 4.1 3.7  CL 75* 77*   < > 83* 86* 90* 91* 93* 93*  CO2 28 29   < > 25 24 25 27 26 25   GLUCOSE 135* 116*   < > 169* 110* 148* 138* 146* 133*  BUN 9 9    < > 7* 6* 6* 5* 7* 7*  CREATININE 0.79 0.71   < > 0.67 0.65 0.74 0.78 0.75 0.65  CALCIUM 9.8 9.6   < > 9.5 9.1 9.2 9.3 9.2 9.3  AST 21  --   --   --   --   --   --  19 18  ALT 15  --   --   --   --   --   --  15 14  ALKPHOS 18*  --   --   --   --   --   --  20* 16*  BILITOT 0.9  --   --   --   --   --   --  0.4 0.6  ALBUMIN 3.7  --   --   --   --   --   --  3.3* 3.1*  MG  --   --   --  1.6*  --   --  1.8  --  1.3*  TSH  --  2.686  --   --   --   --   --   --   --   BNP  --   --   --  22.9  --   --  86.1  --  67.5   < > = values in this interval not displayed.    ------------------------------------------------------------------------------------------------------------------ No results for input(s): CHOL, HDL, LDLCALC, TRIG, CHOLHDL, LDLDIRECT in the last 72 hours.  Lab Results  Component Value Date   HGBA1C 5.6 04/30/2017   ------------------------------------------------------------------------------------------------------------------ Recent Labs    05/30/20 0129  TSH 2.686    Cardiac Enzymes No results for input(s): CKMB, TROPONINI, MYOGLOBIN in the last 168 hours.  Invalid input(s): CK ------------------------------------------------------------------------------------------------------------------    Component Value Date/Time   BNP 67.5 06/01/2020 0256    Micro Results Recent Results (from the past 240 hour(s))  SARS CORONAVIRUS 2 (TAT 6-24 HRS) Nasopharyngeal Nasopharyngeal Swab     Status: None   Collection Time: 05/30/20  2:04 AM   Specimen: Nasopharyngeal Swab  Result Value Ref Range Status   SARS Coronavirus 2 NEGATIVE NEGATIVE Final    Comment: (NOTE) SARS-CoV-2 target nucleic acids are NOT DETECTED.  The SARS-CoV-2 RNA is generally detectable in upper and lower respiratory specimens during the acute phase of infection. Negative results do not preclude SARS-CoV-2 infection, do not rule  out co-infections with other pathogens, and should not be used as  the sole basis for treatment or other patient management decisions. Negative results must be combined with clinical observations, patient history, and epidemiological information. The expected result is Negative.  Fact Sheet for Patients: HairSlick.no  Fact Sheet for Healthcare Providers: quierodirigir.com  This test is not yet approved or cleared by the [place] FDA and  has been authorized for detection and/or diagnosis of SARS-CoV-2 by FDA under an Emergency Use Authorization (EUA). This EUA will remain  in effect (meaning this test can be used) for the duration of the COVID-19 declaration under Se ction 564(b)(1) of the Act, 21 U.S.C. section 360bbb-3(b)(1), unless the authorization is terminated or revoked sooner.  Performed at [facility], [address] [place], [place] [address]     Radiology Reports DG Chest [place] 1 View  Result Date: 06/01/2020 CLINICAL DATA:  70 year old female with shortness of breath. Smoker. EXAM: PORTABLE CHEST 1 VIEW COMPARISON:  Portable chest 05/30/2020 and earlier. FINDINGS: Portable AP semi upright view at 0805 hours. Mildly elevated right hemidiaphragm appears chronic and stable. Normal cardiac size and mediastinal contours. Stable lung markings. Allowing for portable technique the lungs are clear. No pneumothorax or pleural effusion. Degenerative changes in the spine. No acute osseous abnormality identified. IMPRESSION: No acute cardiopulmonary abnormality. Electronically Signed   By: Odessa Fleming M.D.   On: 06/01/2020 08:28   DG CHEST PORT 1 VIEW  Result Date: 05/30/2020 CLINICAL DATA:  Leukocytosis.  History of COPD. EXAM: PORTABLE CHEST 1 VIEW COMPARISON:  01/21/2019 FINDINGS: Shallow inspiration. Segmental elevation of the right hemidiaphragm. Mild cardiac enlargement. Probable small left pleural effusion. Lungs are clear. No pneumothorax. Mediastinal contours appear intact.  Calcification of the aorta. IMPRESSION: Probable small left pleural effusion. Segmental elevation of the right hemidiaphragm. Electronically Signed   By: Burman Nieves M.D.   On: 05/30/2020 01:46

## 2020-06-01 NOTE — Plan of Care (Signed)

## 2020-06-02 LAB — BASIC METABOLIC PANEL
Anion gap: 10 (ref 5–15)
Anion gap: 11 (ref 5–15)
BUN: 7 mg/dL — ABNORMAL LOW (ref 8–23)
BUN: 6 mg/dL — ABNORMAL LOW (ref 8–23)
CO2: 27 mmol/L (ref 22–32)
CO2: 27 mmol/L (ref 22–32)
Calcium: 9.6 mg/dL (ref 8.9–10.3)
Calcium: 9.8 mg/dL (ref 8.9–10.3)
Chloride: 90 mmol/L — ABNORMAL LOW (ref 98–111)
Chloride: 92 mmol/L — ABNORMAL LOW (ref 98–111)
Creatinine, Ser: 0.64 mg/dL (ref 0.44–1.00)
Creatinine, Ser: 0.62 mg/dL (ref 0.44–1.00)
GFR, Estimated: >60 mL/min (ref >60)
GFR, Estimated: >60 mL/min (ref >60)
Glucose, Bld: 128 mg/dL — ABNORMAL HIGH (ref 70–99)
Glucose, Bld: 145 mg/dL — ABNORMAL HIGH (ref 70–99)
Potassium: 3.9 mmol/L (ref 3.5–5.1)
Potassium: 3.8 mmol/L (ref 3.5–5.1)
Sodium: 127 mmol/L — ABNORMAL LOW (ref 135–145)
Sodium: 130 mmol/L — ABNORMAL LOW (ref 135–145)

## 2020-06-02 LAB — GLUCOSE, CAPILLARY
Glucose-Capillary: 138 mg/dL — ABNORMAL HIGH (ref 70–99)
Glucose-Capillary: 140 mg/dL — ABNORMAL HIGH (ref 70–99)
Glucose-Capillary: 142 mg/dL — ABNORMAL HIGH (ref 70–99)
Glucose-Capillary: 155 mg/dL — ABNORMAL HIGH (ref 70–99)

## 2020-06-02 LAB — MAGNESIUM: Magnesium: 1.4 mg/dL — ABNORMAL LOW (ref 1.7–2.4)

## 2020-06-02 MED ORDER — MAGNESIUM SULFATE 2 GM/50ML IV SOLN
2.0000 g | Freq: Once | INTRAVENOUS | Status: AC
  Administered 2020-06-02: 2 g via INTRAVENOUS
  Filled 2020-06-02: qty 50

## 2020-06-02 NOTE — Progress Notes (Signed)
Physical Therapy Treatment Patient Details Name: Alexandra Richardson MRN: [id number] DOB: November 17, 1950 Today's Date: 06/02/2020    History of Present Illness 70 y.o. female presents to [facility] ED on 05/29/2020, referred by home health nurse for abnormal lab values. Pt reports nausea with vomiting and diarrhea over the past few days. Pt admitted for hyponatremia. PMH includes anxiety, arthritis, depression, HLD, HTN.   PT Comments    Pt progressing with mobility. Today's session focused on gait training with RW, as well as additional stair training. Pt requires modA to ascend/descend step without rail support; pt reports having 4 steps into home with no reliable railing, family unable to provide necessary physical assist to navigate these safely; pt requesting ambulance transport home secondary to this; RN and CM notified. Reviewed educ re: fall risk reduction, activity recommendations, importance of mobility.    Follow Up Recommendations  Home health PT     Equipment Recommendations  None recommended by PT    Recommendations for Other Services       Precautions / Restrictions Precautions Precautions: Fall Restrictions Weight Bearing Restrictions: No    Mobility  Bed Mobility Overal bed mobility: Modified Independent             General bed mobility comments: HOB elevated    Transfers Overall transfer level: Modified independent Equipment used: Rolling walker (2 wheeled) Transfers: Sit to/from Stand              Ambulation/Gait Ambulation/Gait assistance: Supervision Gait Distance (Feet): 300 Feet Assistive device: Rolling walker (2 wheeled) Gait Pattern/deviations: Step-through pattern;Decreased stride length;Trunk flexed Gait velocity: Decreased   General Gait Details: Slow, steady gait with RW; distance limited secondary to fatigue post-stair training   Stairs Stairs: Yes Stairs assistance: Min guard;Mod assist Stair Management: One rail Right;No rails;Step to  pattern;Forwards Number of Stairs: 4 (+1) General stair comments: Ascend/descend 4 steps with BUE support on R-handrail, heavy reliance on UE support. Pt does not have sturdy hand rail at home, therefore performed additional trial with HHA, pt ascend/descend 1 step with BUE support on PT's forearm requiring modA to complete   Wheelchair Mobility    Modified Rankin (Stroke Patients Only)       Balance Overall balance assessment: Needs assistance Sitting-balance support: Feet supported Sitting balance-Leahy Scale: Good     Standing balance support: Bilateral upper extremity supported;No upper extremity supported;During functional activity Standing balance-Leahy Scale: Fair                              Cognition Arousal/Alertness: Awake/alert Behavior During Therapy: WFL for tasks assessed/performed Overall Cognitive Status: Within Functional Limits for tasks assessed                                        Exercises      General Comments General comments (skin integrity, edema, etc.): Pt reports family (sister, daughter) unable to provide adequate physical assist to allow for safety navigating 4 steps into home without a reliable handrail, requesting ambulance transport home secondary to this; RN and CM notified      Pertinent Vitals/Pain Pain Assessment: No/denies pain Pain Intervention(s): Monitored during session    Home Living                      Prior Function  PT Goals (current goals can now be found in the care plan section) Progress towards PT goals: Progressing toward goals    Frequency    Min 3X/week      PT Plan Current plan remains appropriate    Co-evaluation              AM-PAC PT "6 Clicks" Mobility   Outcome Measure  Help needed turning from your back to your side while in a flat bed without using bedrails?: None Help needed moving from lying on your back to sitting on the side of a  flat bed without using bedrails?: None Help needed moving to and from a bed to a chair (including a wheelchair)?: None Help needed standing up from a chair using your arms (e.g., wheelchair or bedside chair)?: None Help needed to walk in hospital room?: A Little Help needed climbing 3-5 steps with a railing? : A Little 6 Click Score: 22    End of Session Equipment Utilized During Treatment: Gait belt Activity Tolerance: Patient tolerated treatment well;Patient limited by fatigue Patient left: in bed;with call bell/phone within reach;with bed alarm set Nurse Communication: Mobility status PT Visit Diagnosis: Other abnormalities of gait and mobility (R26.89);Muscle weakness (generalized) (M62.81);Difficulty in walking, not elsewhere classified (R26.2);Unsteadiness on feet (R26.81)     Time: 1050-1110 PT Time Calculation (min) (ACUTE ONLY): 20 min  Charges:  $Gait Training: 8-22 mins                    Ina Homes, PT, DPT Acute Rehabilitation Services  Pager [phone] Office [phone]  Malachy Chamber 06/02/2020, 5:44 PM

## 2020-06-02 NOTE — Plan of Care (Signed)

## 2020-06-02 NOTE — Plan of Care (Signed)
  Problem: Education: Goal: Knowledge of General Education information will improve Description: Including pain rating scale, medication(s)/side effects and non-pharmacologic comfort measures 06/02/2020 1009 by Durward Fortes, RN Outcome: Adequate for Discharge 06/02/2020 0801 by Durward Fortes, RN Outcome: Progressing   Problem: Health Behavior/Discharge Planning: Goal: Ability to manage health-related needs will improve 06/02/2020 1009 by Durward Fortes, RN Outcome: Adequate for Discharge 06/02/2020 0801 by Durward Fortes, RN Outcome: Progressing   Problem: Clinical Measurements: Goal: Ability to maintain clinical measurements within normal limits will improve 06/02/2020 1009 by Durward Fortes, RN Outcome: Adequate for Discharge 06/02/2020 0801 by Durward Fortes, RN Outcome: Progressing Goal: Will remain free from infection 06/02/2020 1009 by Durward Fortes, RN Outcome: Adequate for Discharge 06/02/2020 0801 by Durward Fortes, RN Outcome: Progressing Goal: Diagnostic test results will improve 06/02/2020 1009 by Durward Fortes, RN Outcome: Adequate for Discharge 06/02/2020 0801 by Durward Fortes, RN Outcome: Progressing Goal: Respiratory complications will improve 06/02/2020 1009 by Durward Fortes, RN Outcome: Adequate for Discharge 06/02/2020 0801 by Durward Fortes, RN Outcome: Progressing Goal: Cardiovascular complication will be avoided 06/02/2020 1009 by Durward Fortes, RN Outcome: Adequate for Discharge 06/02/2020 0801 by Durward Fortes, RN Outcome: Progressing   Problem: Activity: Goal: Risk for activity intolerance will decrease 06/02/2020 1009 by Durward Fortes, RN Outcome: Adequate for Discharge 06/02/2020 0801 by Durward Fortes, RN Outcome: Progressing   Problem: Nutrition: Goal: Adequate nutrition will be maintained 06/02/2020 1009 by Durward Fortes, RN Outcome: Adequate for Discharge 06/02/2020 0801 by Durward Fortes, RN Outcome: Progressing   Problem:  Coping: Goal: Level of anxiety will decrease 06/02/2020 1009 by Durward Fortes, RN Outcome: Adequate for Discharge 06/02/2020 0801 by Durward Fortes, RN Outcome: Progressing   Problem: Elimination: Goal: Will not experience complications related to bowel motility 06/02/2020 1009 by Durward Fortes, RN Outcome: Adequate for Discharge 06/02/2020 0801 by Durward Fortes, RN Outcome: Progressing Goal: Will not experience complications related to urinary retention 06/02/2020 1009 by Durward Fortes, RN Outcome: Adequate for Discharge 06/02/2020 0801 by Durward Fortes, RN Outcome: Progressing   Problem: Pain Managment: Goal: General experience of comfort will improve 06/02/2020 1009 by Durward Fortes, RN Outcome: Adequate for Discharge 06/02/2020 0801 by Durward Fortes, RN Outcome: Progressing   Problem: Safety: Goal: Ability to remain free from injury will improve 06/02/2020 1009 by Durward Fortes, RN Outcome: Adequate for Discharge 06/02/2020 0801 by Durward Fortes, RN Outcome: Progressing   Problem: Skin Integrity: Goal: Risk for impaired skin integrity will decrease 06/02/2020 1009 by Durward Fortes, RN Outcome: Adequate for Discharge 06/02/2020 0801 by Durward Fortes, RN Outcome: Progressing

## 2020-06-02 NOTE — Care Management Important Message (Signed)
Important Message  Patient Details  Name: [name] MRN: [id number] Date of Birth: 1950-02-12   Medicare Important Message Given:  Yes     Renie Ora 06/02/2020, 12:44 PM

## 2020-06-02 NOTE — Discharge Instructions (Signed)
Follow with Primary MD Hamrick, Maura L, MD in 7 days   Get CBC, CMP, Magnesium, 2 view Chest X ray -  checked next visit within 1 week by Primary MD   Activity: As tolerated with Full fall precautions use walker/cane & assistance as needed  Disposition Home    Diet: Heart Healthy    Special Instructions: If you have smoked or chewed Tobacco  in the last 2 yrs please stop smoking, stop any regular Alcohol  and or any Recreational drug use.  On your next visit with your primary care physician please Get Medicines reviewed and adjusted.  Please request your Prim.MD to go over all Hospital Tests and Procedure/Radiological results at the follow up, please get all Hospital records sent to your Prim MD by signing hospital release before you go home.  If you experience worsening of your admission symptoms, develop shortness of breath, life threatening emergency, suicidal or homicidal thoughts you must seek medical attention immediately by calling 911 or calling your MD immediately  if symptoms less severe.  You Must read complete instructions/literature along with all the possible adverse reactions/side effects for all the Medicines you take and that have been prescribed to you. Take any new Medicines after you have completely understood and accpet all the possible adverse reactions/side effects.

## 2020-06-02 NOTE — TOC Transition Note (Signed)
Transition of Care (TOC) - CM/SW Discharge Note Donn Pierini RN, BSN Transitions of Care Unit 4E- RN Case Manager See Treatment Team for direct phone # Cross Coverage for 6E   Patient Details  Name: Alexandra Richardson MRN: [id number] Date of Birth: 1950/10/03  Transition of Care [facility]) CM/SW Contact:  Darrold Span, RN Phone Number: 06/02/2020, 12:11 PM   Clinical Narrative:    Pt stable for transition home today, Orders placed for [facility] and DME needs. Cm in to speak with pt at bedside. Per pt she is active with [facility] for [facility] services- list provided Per CMS guidelines from medicare.gov website with star ratings (copy placed in shadow chart)- per pt she would like to continue services with [facility].  Discussed DME- pt reports she has RW at home and declines any other DME needs. Pt reports family can transport home however they can not assist up the stairs and pt needs assistance at home getting into the home. Explained that we could use [facility] to assist pt with getting home and into her home- pt is agreeable to this.  Will speak with unit staff who will call for transport when pt is ready - and request door to door service for transport assistance.   Call made to [facility]- spoke with Alvino Chapel- who confirmed pt is active with them- services can resume on discharge- they will pull everything they need from Epic.    Final next level of care: Home w Home Health Services Barriers to Discharge: No Barriers Identified   Patient Goals and CMS Choice Patient states their goals for this hospitalization and ongoing recovery are:: return home with family CMS Medicare.gov Compare Post Acute Care list provided to:: Patient Choice offered to / list presented to : Patient  Discharge Placement               Home with [facility]        Discharge Plan and Services   Discharge Planning Services: CM Consult Post Acute Care Choice: Home Health,Resumption of Svcs/PTA Provider           DME Arranged: Dan Humphreys rolling,Patient refused services DME Agency: NA       HH Arranged: RN,PT,Nurse's Aide,Social Work [employer] Agency: [facility] Date [facility] Agency Contacted: 06/02/20 Time HH Agency Contacted: 1145 Representative spoke with at [facility] Agency: Alvino Chapel  Social Determinants of Health (SDOH) Interventions     Readmission Risk Interventions Readmission Risk Prevention Plan 06/02/2020  Post Dischage Appt Complete  Medication Screening Complete  Transportation Screening Complete  Some recent data might be hidden

## 2020-06-02 NOTE — Discharge Summary (Signed)
Alexandra Richardson QQI:[id number] DOB: 24-Jan-1951 DOA: 05/29/2020  PCP: Ailene Ravel, MD  Admit date: 05/29/2020  Discharge date: 06/02/2020  Admitted From: Home  Disposition:  Home   Recommendations for Outpatient Follow-up:   Follow up with PCP in 1-2 weeks  PCP Please obtain BMP/CBC, 2 view CXR in 1week,  (see Discharge instructions)   PCP Please follow up on the following pending results: Monitor magnesium, CBC CMP and a two-view chest x-ray in 7 to 10 days.   Home Health: PT, RN, Aide  Equipment/Devices: Walker  Consultations: Renal Discharge Condition: Stable    CODE STATUS: Full    Diet Recommendation: Heart Healthy   Diet Order            Diet - low sodium heart healthy           DIET SOFT Room service appropriate? Yes; Fluid consistency: Thin  Diet effective now                  Chief Complaint  Patient presents with  . abnormal labs     Brief history of present illness from the day of admission and additional interim summary    Alexandra Richardson a 70 y.o.femalewith medical history significant ofCVD, hyperlipidemia, hypertension, anxiety, depression, diabetes who had nausea vomiting and diarrhea multiple few days ago, she subsequently got her lab work done which showed that her sodium was low and she was sent to the hospital for further care.                                                                 Hospital Course    1.  Severe Hyponatremia likely due to dehydration caused by viral gastroenteritis.  Gastroenteritis has resolved with supportive care, she was treated with IV fluids and sodium came up nicely over the first 36 hours, subsequently she also developed some element of SIADH and was given 1 dose of Samsca on 06/01/2020, sodium much better, clinically symptom-free and  wants to be discharged home, will request PCP to monitor sodium levels closely if hyponatremia recurs please address her psych meds which could eventually cause SIADH.  2.  Hypomagnesemia.  Replaced IV again on 06/02/2020.  3.  Reactionary leukocytosis.  Resolved.  Likely had viral gastroenteritis  4.  Anxiety and depression.  On home medications which will be gradually started once sodium is better, possibly after discharge as they might add an element of SIADH to her hyponatremia etiology.  5.  COPD.  Currently stable supportive care.  6.  Essential hypertension.  Currently controlled regimen .  7. DM Type II.    Diet controlled continue.  Lab Results  Component Value Date   HGBA1C 5.6 04/30/2017    Discharge diagnosis     Principal Problem:  Hyponatremia Active Problems:   Hypertension   Depression   Hyperlipidemia   Anxiety   Pressure injury of skin    Discharge instructions    Discharge Instructions    Diet - low sodium heart healthy   Complete by: As directed    Discharge instructions   Complete by: As directed    Follow with Primary MD Hamrick, Maura L, MD in 7 days   Get CBC, CMP, Magnesium, 2 view Chest X ray -  checked next visit within 1 week by Primary MD   Activity: As tolerated with Full fall precautions use walker/cane & assistance as needed  Disposition Home    Diet: Heart Healthy    Special Instructions: If you have smoked or chewed Tobacco  in the last 2 yrs please stop smoking, stop any regular Alcohol  and or any Recreational drug use.  On your next visit with your primary care physician please Get Medicines reviewed and adjusted.  Please request your Prim.MD to go over all Hospital Tests and Procedure/Radiological results at the follow up, please get all Hospital records sent to your Prim MD by signing hospital release before you go home.  If you experience worsening of your admission symptoms, develop shortness of breath, life  threatening emergency, suicidal or homicidal thoughts you must seek medical attention immediately by calling 911 or calling your MD immediately  if symptoms less severe.  You Must read complete instructions/literature along with all the possible adverse reactions/side effects for all the Medicines you take and that have been prescribed to you. Take any new Medicines after you have completely understood and accpet all the possible adverse reactions/side effects.   Increase activity slowly   Complete by: As directed    No wound care   Complete by: As directed       Discharge Medications   Allergies as of 06/02/2020      Reactions   Bee Venom Swelling   Codeine Nausea And Vomiting      Medication List    STOP taking these medications   celecoxib 200 MG capsule Commonly known as: CELEBREX     TAKE these medications   aspirin EC 81 MG tablet Take 81 mg by mouth daily.   atorvastatin 80 MG tablet Commonly known as: LIPITOR Take 80 mg by mouth daily.   busPIRone 15 MG tablet Commonly known as: BUSPAR Take 5 mg by mouth 2 (two) times daily.   CENTRUM SILVER PO Take 1 capsule by mouth daily.   cyclobenzaprine 5 MG tablet Commonly known as: FLEXERIL Take 5 mg by mouth at bedtime.   famotidine 40 MG tablet Commonly known as: PEPCID Take 40 mg by mouth daily.   fluticasone 50 MCG/ACT nasal spray Commonly known as: FLONASE Place 2 sprays into both nostrils daily.   gabapentin 600 MG tablet Commonly known as: NEURONTIN Take 1 tablet (600 mg total) by mouth at bedtime. What changed: when to take this   metFORMIN 850 MG tablet Commonly known as: GLUCOPHAGE Take 850 mg by mouth 2 (two) times daily.   metoprolol succinate 50 MG 24 hr tablet Commonly known as: TOPROL-XL Take 75 mg by mouth daily.   omeprazole 20 MG capsule Commonly known as: PRILOSEC Take 20 mg by mouth daily.   pregabalin 150 MG capsule Commonly known as: LYRICA Take 150 mg by mouth daily.    ramipril 10 MG capsule Commonly known as: ALTACE Take 10 mg by mouth daily.   risperiDONE 2 MG tablet Commonly  known as: RISPERDAL Take 2 mg by mouth 2 (two) times daily.   SUMAtriptan 100 MG tablet Commonly known as: IMITREX Take 100 mg by mouth every 12 (twelve) hours as needed.   tiotropium 18 MCG inhalation capsule Commonly known as: SPIRIVA Place 1 capsule (18 mcg total) into inhaler and inhale daily.            Durable Medical Equipment  (From admission, onward)         Start     Ordered   06/02/20 1006  For home use only DME Walker rolling  Once       Comments: 5 wheel  Question Answer Comment  Walker: With 5 Inch Wheels   Patient needs a walker to treat with the following condition Weakness      06/02/20 1005           Follow-up Information    Hamrick, Maura L, MD. Schedule an appointment as soon as possible for a visit in 1 week(s).   Specialty: Family Medicine Why: Get magnesium, CBC and CMP rechecked Contact information: [address] [place] [address] [phone]               Major procedures and Radiology Reports - PLEASE review detailed and final reports thoroughly  -       DG Chest Port 1 View  Result Date: 06/01/2020 CLINICAL DATA:  70 year old female with shortness of breath. Smoker. EXAM: PORTABLE CHEST 1 VIEW COMPARISON:  Portable chest 05/30/2020 and earlier. FINDINGS: Portable AP semi upright view at 0805 hours. Mildly elevated right hemidiaphragm appears chronic and stable. Normal cardiac size and mediastinal contours. Stable lung markings. Allowing for portable technique the lungs are clear. No pneumothorax or pleural effusion. Degenerative changes in the spine. No acute osseous abnormality identified. IMPRESSION: No acute cardiopulmonary abnormality. Electronically Signed   By: Odessa FlemingH  Hall M.D.   On: 06/01/2020 08:28   DG CHEST PORT 1 VIEW  Result Date: 05/30/2020 CLINICAL DATA:  Leukocytosis.  History of COPD. EXAM:  PORTABLE CHEST 1 VIEW COMPARISON:  01/21/2019 FINDINGS: Shallow inspiration. Segmental elevation of the right hemidiaphragm. Mild cardiac enlargement. Probable small left pleural effusion. Lungs are clear. No pneumothorax. Mediastinal contours appear intact. Calcification of the aorta. IMPRESSION: Probable small left pleural effusion. Segmental elevation of the right hemidiaphragm. Electronically Signed   By: Burman NievesWilliam  Stevens M.D.   On: 05/30/2020 01:46    Micro Results     Recent Results (from the past 240 hour(s))  SARS CORONAVIRUS 2 (TAT 6-24 HRS) Nasopharyngeal Nasopharyngeal Swab     Status: None   Collection Time: 05/30/20  2:04 AM   Specimen: Nasopharyngeal Swab  Result Value Ref Range Status   SARS Coronavirus 2 NEGATIVE NEGATIVE Final    Comment: (NOTE) SARS-CoV-2 target nucleic acids are NOT DETECTED.  The SARS-CoV-2 RNA is generally detectable in upper and lower respiratory specimens during the acute phase of infection. Negative results do not preclude SARS-CoV-2 infection, do not rule out co-infections with other pathogens, and should not be used as the sole basis for treatment or other patient management decisions. Negative results must be combined with clinical observations, patient history, and epidemiological information. The expected result is Negative.  Fact Sheet for Patients: HairSlick.nohttps://www.fda.gov/media/138098/download  Fact Sheet for Healthcare Providers: quierodirigir.comhttps://www.fda.gov/media/138095/download  This test is not yet approved or cleared by the [place] FDA and  has been authorized for detection and/or diagnosis of SARS-CoV-2 by FDA under an Emergency Use Authorization (EUA). This EUA will  remain  in effect (meaning this test can be used) for the duration of the COVID-19 declaration under Se ction 564(b)(1) of the Act, 21 U.S.C. section 360bbb-3(b)(1), unless the authorization is terminated or revoked sooner.  Performed at [facility], [address], [place], [place] [address]     Today   Subjective    Alexandra Richardson today has no headache,no chest abdominal pain,no new weakness tingling or numbness, feels much better wants to go home today.     Objective   Blood pressure 139/62, pulse 84, temperature 98.9 F (37.2 C), temperature source Oral, resp. rate 18, height 5\' 3"  (1.6 m), weight 73.4 kg, SpO2 95 %.   Intake/Output Summary (Last 24 hours) at 06/02/2020 1007 Last data filed at 06/02/2020 0215 Gross per 24 hour  Intake 356 ml  Output 400 ml  Net -44 ml    Exam  Awake Alert, No new F.N deficits, Normal affect .AT,PERRAL Supple Neck,No JVD, No cervical lymphadenopathy appriciated.  Symmetrical Chest wall movement, Good air movement bilaterally, CTAB RRR,No Gallops,Rubs or new Murmurs, No Parasternal Heave +ve B.Sounds, Abd Soft, Non tender, No organomegaly appriciated, No rebound -guarding or rigidity. No Cyanosis, Clubbing or edema, No new Rash or bruise   Data Review   CBC w Diff:  Lab Results  Component Value Date   WBC 8.3 06/01/2020   HGB 11.4 (L) 06/01/2020   HCT 33.6 (L) 06/01/2020   PLT 230 06/01/2020   LYMPHOPCT 38 06/01/2020   MONOPCT 10 06/01/2020   EOSPCT 1 06/01/2020   BASOPCT 1 06/01/2020    CMP:  Lab Results  Component Value Date   NA 130 (L) 06/02/2020   K 3.8 06/02/2020   CL 92 (L) 06/02/2020   CO2 27 06/02/2020   BUN 6 (L) 06/02/2020   CREATININE 0.62 06/02/2020   PROT 6.3 (L) 06/01/2020   ALBUMIN 3.1 (L) 06/01/2020   BILITOT 0.6 06/01/2020   ALKPHOS 16 (L) 06/01/2020   AST 18 06/01/2020   ALT 14 06/01/2020  .   Total Time in preparing paper work, data evaluation and todays exam - 35 minutes  06/03/2020 M.D on 06/02/2020 at 10:07 AM  [facility]

## 2020-06-03 DIAGNOSIS — Z79899 Other long term (current) drug therapy: Secondary | ICD-10-CM | POA: Diagnosis not present

## 2020-06-03 DIAGNOSIS — G4700 Insomnia, unspecified: Secondary | ICD-10-CM | POA: Diagnosis not present

## 2020-06-03 DIAGNOSIS — F17210 Nicotine dependence, cigarettes, uncomplicated: Secondary | ICD-10-CM | POA: Diagnosis not present

## 2020-06-03 DIAGNOSIS — R5383 Other fatigue: Secondary | ICD-10-CM | POA: Diagnosis not present

## 2020-06-03 DIAGNOSIS — F17200 Nicotine dependence, unspecified, uncomplicated: Secondary | ICD-10-CM | POA: Diagnosis not present

## 2020-06-03 DIAGNOSIS — E559 Vitamin D deficiency, unspecified: Secondary | ICD-10-CM | POA: Diagnosis not present

## 2020-06-04 DIAGNOSIS — I700 Atherosclerosis of aorta: Secondary | ICD-10-CM | POA: Diagnosis not present

## 2020-06-04 DIAGNOSIS — F17210 Nicotine dependence, cigarettes, uncomplicated: Secondary | ICD-10-CM | POA: Diagnosis not present

## 2020-06-04 DIAGNOSIS — Z7982 Long term (current) use of aspirin: Secondary | ICD-10-CM | POA: Diagnosis not present

## 2020-06-04 DIAGNOSIS — E041 Nontoxic single thyroid nodule: Secondary | ICD-10-CM | POA: Diagnosis not present

## 2020-06-04 DIAGNOSIS — G4700 Insomnia, unspecified: Secondary | ICD-10-CM | POA: Diagnosis not present

## 2020-06-04 DIAGNOSIS — J449 Chronic obstructive pulmonary disease, unspecified: Secondary | ICD-10-CM | POA: Diagnosis not present

## 2020-06-04 DIAGNOSIS — Z7984 Long term (current) use of oral hypoglycemic drugs: Secondary | ICD-10-CM | POA: Diagnosis not present

## 2020-06-04 DIAGNOSIS — E1140 Type 2 diabetes mellitus with diabetic neuropathy, unspecified: Secondary | ICD-10-CM | POA: Diagnosis not present

## 2020-06-04 DIAGNOSIS — G43909 Migraine, unspecified, not intractable, without status migrainosus: Secondary | ICD-10-CM | POA: Diagnosis not present

## 2020-06-04 DIAGNOSIS — M4800 Spinal stenosis, site unspecified: Secondary | ICD-10-CM | POA: Diagnosis not present

## 2020-06-04 DIAGNOSIS — K219 Gastro-esophageal reflux disease without esophagitis: Secondary | ICD-10-CM | POA: Diagnosis not present

## 2020-06-04 DIAGNOSIS — E559 Vitamin D deficiency, unspecified: Secondary | ICD-10-CM | POA: Diagnosis not present

## 2020-06-04 DIAGNOSIS — R809 Proteinuria, unspecified: Secondary | ICD-10-CM | POA: Diagnosis not present

## 2020-06-04 DIAGNOSIS — E1129 Type 2 diabetes mellitus with other diabetic kidney complication: Secondary | ICD-10-CM | POA: Diagnosis not present

## 2020-06-04 DIAGNOSIS — M47819 Spondylosis without myelopathy or radiculopathy, site unspecified: Secondary | ICD-10-CM | POA: Diagnosis not present

## 2020-06-04 DIAGNOSIS — E871 Hypo-osmolality and hyponatremia: Secondary | ICD-10-CM | POA: Diagnosis not present

## 2020-06-04 DIAGNOSIS — M1710 Unilateral primary osteoarthritis, unspecified knee: Secondary | ICD-10-CM | POA: Diagnosis not present

## 2020-06-04 DIAGNOSIS — I10 Essential (primary) hypertension: Secondary | ICD-10-CM | POA: Diagnosis not present

## 2020-06-04 DIAGNOSIS — E782 Mixed hyperlipidemia: Secondary | ICD-10-CM | POA: Diagnosis not present

## 2020-06-04 DIAGNOSIS — J3489 Other specified disorders of nose and nasal sinuses: Secondary | ICD-10-CM | POA: Diagnosis not present

## 2020-06-04 DIAGNOSIS — M5136 Other intervertebral disc degeneration, lumbar region: Secondary | ICD-10-CM | POA: Diagnosis not present

## 2020-06-04 DIAGNOSIS — Z9181 History of falling: Secondary | ICD-10-CM | POA: Diagnosis not present

## 2020-06-08 DIAGNOSIS — G4700 Insomnia, unspecified: Secondary | ICD-10-CM | POA: Diagnosis not present

## 2020-06-08 DIAGNOSIS — E1129 Type 2 diabetes mellitus with other diabetic kidney complication: Secondary | ICD-10-CM | POA: Diagnosis not present

## 2020-06-08 DIAGNOSIS — R809 Proteinuria, unspecified: Secondary | ICD-10-CM | POA: Diagnosis not present

## 2020-06-08 DIAGNOSIS — I10 Essential (primary) hypertension: Secondary | ICD-10-CM | POA: Diagnosis not present

## 2020-06-08 DIAGNOSIS — J449 Chronic obstructive pulmonary disease, unspecified: Secondary | ICD-10-CM | POA: Diagnosis not present

## 2020-06-08 DIAGNOSIS — E1140 Type 2 diabetes mellitus with diabetic neuropathy, unspecified: Secondary | ICD-10-CM | POA: Diagnosis not present

## 2020-06-08 DIAGNOSIS — I700 Atherosclerosis of aorta: Secondary | ICD-10-CM | POA: Diagnosis not present

## 2020-06-08 DIAGNOSIS — E559 Vitamin D deficiency, unspecified: Secondary | ICD-10-CM | POA: Diagnosis not present

## 2020-06-08 DIAGNOSIS — M47819 Spondylosis without myelopathy or radiculopathy, site unspecified: Secondary | ICD-10-CM | POA: Diagnosis not present

## 2020-06-08 DIAGNOSIS — E782 Mixed hyperlipidemia: Secondary | ICD-10-CM | POA: Diagnosis not present

## 2020-06-08 DIAGNOSIS — M4800 Spinal stenosis, site unspecified: Secondary | ICD-10-CM | POA: Diagnosis not present

## 2020-06-08 DIAGNOSIS — M5136 Other intervertebral disc degeneration, lumbar region: Secondary | ICD-10-CM | POA: Diagnosis not present

## 2020-06-08 DIAGNOSIS — Z9181 History of falling: Secondary | ICD-10-CM | POA: Diagnosis not present

## 2020-06-08 DIAGNOSIS — Z7984 Long term (current) use of oral hypoglycemic drugs: Secondary | ICD-10-CM | POA: Diagnosis not present

## 2020-06-08 DIAGNOSIS — G43909 Migraine, unspecified, not intractable, without status migrainosus: Secondary | ICD-10-CM | POA: Diagnosis not present

## 2020-06-08 DIAGNOSIS — J3489 Other specified disorders of nose and nasal sinuses: Secondary | ICD-10-CM | POA: Diagnosis not present

## 2020-06-08 DIAGNOSIS — E041 Nontoxic single thyroid nodule: Secondary | ICD-10-CM | POA: Diagnosis not present

## 2020-06-08 DIAGNOSIS — K219 Gastro-esophageal reflux disease without esophagitis: Secondary | ICD-10-CM | POA: Diagnosis not present

## 2020-06-08 DIAGNOSIS — M1710 Unilateral primary osteoarthritis, unspecified knee: Secondary | ICD-10-CM | POA: Diagnosis not present

## 2020-06-08 DIAGNOSIS — E871 Hypo-osmolality and hyponatremia: Secondary | ICD-10-CM | POA: Diagnosis not present

## 2020-06-08 DIAGNOSIS — F17210 Nicotine dependence, cigarettes, uncomplicated: Secondary | ICD-10-CM | POA: Diagnosis not present

## 2020-06-08 DIAGNOSIS — Z7982 Long term (current) use of aspirin: Secondary | ICD-10-CM | POA: Diagnosis not present

## 2020-06-09 DIAGNOSIS — M5136 Other intervertebral disc degeneration, lumbar region: Secondary | ICD-10-CM | POA: Diagnosis not present

## 2020-06-09 DIAGNOSIS — E1129 Type 2 diabetes mellitus with other diabetic kidney complication: Secondary | ICD-10-CM | POA: Diagnosis not present

## 2020-06-09 DIAGNOSIS — J3489 Other specified disorders of nose and nasal sinuses: Secondary | ICD-10-CM | POA: Diagnosis not present

## 2020-06-09 DIAGNOSIS — Z9181 History of falling: Secondary | ICD-10-CM | POA: Diagnosis not present

## 2020-06-09 DIAGNOSIS — E1140 Type 2 diabetes mellitus with diabetic neuropathy, unspecified: Secondary | ICD-10-CM | POA: Diagnosis not present

## 2020-06-09 DIAGNOSIS — E559 Vitamin D deficiency, unspecified: Secondary | ICD-10-CM | POA: Diagnosis not present

## 2020-06-09 DIAGNOSIS — M1710 Unilateral primary osteoarthritis, unspecified knee: Secondary | ICD-10-CM | POA: Diagnosis not present

## 2020-06-09 DIAGNOSIS — R809 Proteinuria, unspecified: Secondary | ICD-10-CM | POA: Diagnosis not present

## 2020-06-09 DIAGNOSIS — Z7984 Long term (current) use of oral hypoglycemic drugs: Secondary | ICD-10-CM | POA: Diagnosis not present

## 2020-06-09 DIAGNOSIS — G4700 Insomnia, unspecified: Secondary | ICD-10-CM | POA: Diagnosis not present

## 2020-06-09 DIAGNOSIS — Z7982 Long term (current) use of aspirin: Secondary | ICD-10-CM | POA: Diagnosis not present

## 2020-06-09 DIAGNOSIS — M47819 Spondylosis without myelopathy or radiculopathy, site unspecified: Secondary | ICD-10-CM | POA: Diagnosis not present

## 2020-06-09 DIAGNOSIS — I700 Atherosclerosis of aorta: Secondary | ICD-10-CM | POA: Diagnosis not present

## 2020-06-09 DIAGNOSIS — F17210 Nicotine dependence, cigarettes, uncomplicated: Secondary | ICD-10-CM | POA: Diagnosis not present

## 2020-06-09 DIAGNOSIS — G43909 Migraine, unspecified, not intractable, without status migrainosus: Secondary | ICD-10-CM | POA: Diagnosis not present

## 2020-06-09 DIAGNOSIS — E871 Hypo-osmolality and hyponatremia: Secondary | ICD-10-CM | POA: Diagnosis not present

## 2020-06-09 DIAGNOSIS — M4800 Spinal stenosis, site unspecified: Secondary | ICD-10-CM | POA: Diagnosis not present

## 2020-06-09 DIAGNOSIS — E782 Mixed hyperlipidemia: Secondary | ICD-10-CM | POA: Diagnosis not present

## 2020-06-09 DIAGNOSIS — I10 Essential (primary) hypertension: Secondary | ICD-10-CM | POA: Diagnosis not present

## 2020-06-09 DIAGNOSIS — K219 Gastro-esophageal reflux disease without esophagitis: Secondary | ICD-10-CM | POA: Diagnosis not present

## 2020-06-09 DIAGNOSIS — E041 Nontoxic single thyroid nodule: Secondary | ICD-10-CM | POA: Diagnosis not present

## 2020-06-09 DIAGNOSIS — J449 Chronic obstructive pulmonary disease, unspecified: Secondary | ICD-10-CM | POA: Diagnosis not present

## 2020-06-12 DIAGNOSIS — G43909 Migraine, unspecified, not intractable, without status migrainosus: Secondary | ICD-10-CM | POA: Diagnosis not present

## 2020-06-12 DIAGNOSIS — E041 Nontoxic single thyroid nodule: Secondary | ICD-10-CM | POA: Diagnosis not present

## 2020-06-12 DIAGNOSIS — I700 Atherosclerosis of aorta: Secondary | ICD-10-CM | POA: Diagnosis not present

## 2020-06-12 DIAGNOSIS — R809 Proteinuria, unspecified: Secondary | ICD-10-CM | POA: Diagnosis not present

## 2020-06-12 DIAGNOSIS — E559 Vitamin D deficiency, unspecified: Secondary | ICD-10-CM | POA: Diagnosis not present

## 2020-06-12 DIAGNOSIS — E782 Mixed hyperlipidemia: Secondary | ICD-10-CM | POA: Diagnosis not present

## 2020-06-12 DIAGNOSIS — G4700 Insomnia, unspecified: Secondary | ICD-10-CM | POA: Diagnosis not present

## 2020-06-12 DIAGNOSIS — M47819 Spondylosis without myelopathy or radiculopathy, site unspecified: Secondary | ICD-10-CM | POA: Diagnosis not present

## 2020-06-12 DIAGNOSIS — M5136 Other intervertebral disc degeneration, lumbar region: Secondary | ICD-10-CM | POA: Diagnosis not present

## 2020-06-12 DIAGNOSIS — M1710 Unilateral primary osteoarthritis, unspecified knee: Secondary | ICD-10-CM | POA: Diagnosis not present

## 2020-06-12 DIAGNOSIS — E871 Hypo-osmolality and hyponatremia: Secondary | ICD-10-CM | POA: Diagnosis not present

## 2020-06-12 DIAGNOSIS — K219 Gastro-esophageal reflux disease without esophagitis: Secondary | ICD-10-CM | POA: Diagnosis not present

## 2020-06-12 DIAGNOSIS — Z7984 Long term (current) use of oral hypoglycemic drugs: Secondary | ICD-10-CM | POA: Diagnosis not present

## 2020-06-12 DIAGNOSIS — E1140 Type 2 diabetes mellitus with diabetic neuropathy, unspecified: Secondary | ICD-10-CM | POA: Diagnosis not present

## 2020-06-12 DIAGNOSIS — E1129 Type 2 diabetes mellitus with other diabetic kidney complication: Secondary | ICD-10-CM | POA: Diagnosis not present

## 2020-06-12 DIAGNOSIS — J3489 Other specified disorders of nose and nasal sinuses: Secondary | ICD-10-CM | POA: Diagnosis not present

## 2020-06-12 DIAGNOSIS — Z9181 History of falling: Secondary | ICD-10-CM | POA: Diagnosis not present

## 2020-06-12 DIAGNOSIS — I10 Essential (primary) hypertension: Secondary | ICD-10-CM | POA: Diagnosis not present

## 2020-06-12 DIAGNOSIS — Z7982 Long term (current) use of aspirin: Secondary | ICD-10-CM | POA: Diagnosis not present

## 2020-06-12 DIAGNOSIS — F17210 Nicotine dependence, cigarettes, uncomplicated: Secondary | ICD-10-CM | POA: Diagnosis not present

## 2020-06-12 DIAGNOSIS — J449 Chronic obstructive pulmonary disease, unspecified: Secondary | ICD-10-CM | POA: Diagnosis not present

## 2020-06-12 DIAGNOSIS — M4800 Spinal stenosis, site unspecified: Secondary | ICD-10-CM | POA: Diagnosis not present

## 2020-06-15 DIAGNOSIS — E782 Mixed hyperlipidemia: Secondary | ICD-10-CM | POA: Diagnosis not present

## 2020-06-15 DIAGNOSIS — E1140 Type 2 diabetes mellitus with diabetic neuropathy, unspecified: Secondary | ICD-10-CM | POA: Diagnosis not present

## 2020-06-15 DIAGNOSIS — M1710 Unilateral primary osteoarthritis, unspecified knee: Secondary | ICD-10-CM | POA: Diagnosis not present

## 2020-06-15 DIAGNOSIS — K219 Gastro-esophageal reflux disease without esophagitis: Secondary | ICD-10-CM | POA: Diagnosis not present

## 2020-06-15 DIAGNOSIS — J3489 Other specified disorders of nose and nasal sinuses: Secondary | ICD-10-CM | POA: Diagnosis not present

## 2020-06-15 DIAGNOSIS — G4700 Insomnia, unspecified: Secondary | ICD-10-CM | POA: Diagnosis not present

## 2020-06-15 DIAGNOSIS — E871 Hypo-osmolality and hyponatremia: Secondary | ICD-10-CM | POA: Diagnosis not present

## 2020-06-15 DIAGNOSIS — E041 Nontoxic single thyroid nodule: Secondary | ICD-10-CM | POA: Diagnosis not present

## 2020-06-15 DIAGNOSIS — F17210 Nicotine dependence, cigarettes, uncomplicated: Secondary | ICD-10-CM | POA: Diagnosis not present

## 2020-06-15 DIAGNOSIS — E559 Vitamin D deficiency, unspecified: Secondary | ICD-10-CM | POA: Diagnosis not present

## 2020-06-15 DIAGNOSIS — M4800 Spinal stenosis, site unspecified: Secondary | ICD-10-CM | POA: Diagnosis not present

## 2020-06-15 DIAGNOSIS — I700 Atherosclerosis of aorta: Secondary | ICD-10-CM | POA: Diagnosis not present

## 2020-06-15 DIAGNOSIS — I10 Essential (primary) hypertension: Secondary | ICD-10-CM | POA: Diagnosis not present

## 2020-06-15 DIAGNOSIS — J449 Chronic obstructive pulmonary disease, unspecified: Secondary | ICD-10-CM | POA: Diagnosis not present

## 2020-06-15 DIAGNOSIS — Z7984 Long term (current) use of oral hypoglycemic drugs: Secondary | ICD-10-CM | POA: Diagnosis not present

## 2020-06-15 DIAGNOSIS — M47819 Spondylosis without myelopathy or radiculopathy, site unspecified: Secondary | ICD-10-CM | POA: Diagnosis not present

## 2020-06-15 DIAGNOSIS — M5136 Other intervertebral disc degeneration, lumbar region: Secondary | ICD-10-CM | POA: Diagnosis not present

## 2020-06-15 DIAGNOSIS — Z7982 Long term (current) use of aspirin: Secondary | ICD-10-CM | POA: Diagnosis not present

## 2020-06-15 DIAGNOSIS — R809 Proteinuria, unspecified: Secondary | ICD-10-CM | POA: Diagnosis not present

## 2020-06-15 DIAGNOSIS — E1129 Type 2 diabetes mellitus with other diabetic kidney complication: Secondary | ICD-10-CM | POA: Diagnosis not present

## 2020-06-15 DIAGNOSIS — G43909 Migraine, unspecified, not intractable, without status migrainosus: Secondary | ICD-10-CM | POA: Diagnosis not present

## 2020-06-15 DIAGNOSIS — Z9181 History of falling: Secondary | ICD-10-CM | POA: Diagnosis not present

## 2020-06-17 DIAGNOSIS — M4800 Spinal stenosis, site unspecified: Secondary | ICD-10-CM | POA: Diagnosis not present

## 2020-06-17 DIAGNOSIS — K219 Gastro-esophageal reflux disease without esophagitis: Secondary | ICD-10-CM | POA: Diagnosis not present

## 2020-06-17 DIAGNOSIS — J449 Chronic obstructive pulmonary disease, unspecified: Secondary | ICD-10-CM | POA: Diagnosis not present

## 2020-06-17 DIAGNOSIS — Z7984 Long term (current) use of oral hypoglycemic drugs: Secondary | ICD-10-CM | POA: Diagnosis not present

## 2020-06-17 DIAGNOSIS — E871 Hypo-osmolality and hyponatremia: Secondary | ICD-10-CM | POA: Diagnosis not present

## 2020-06-17 DIAGNOSIS — G43909 Migraine, unspecified, not intractable, without status migrainosus: Secondary | ICD-10-CM | POA: Diagnosis not present

## 2020-06-17 DIAGNOSIS — Z7982 Long term (current) use of aspirin: Secondary | ICD-10-CM | POA: Diagnosis not present

## 2020-06-17 DIAGNOSIS — E782 Mixed hyperlipidemia: Secondary | ICD-10-CM | POA: Diagnosis not present

## 2020-06-17 DIAGNOSIS — E1140 Type 2 diabetes mellitus with diabetic neuropathy, unspecified: Secondary | ICD-10-CM | POA: Diagnosis not present

## 2020-06-17 DIAGNOSIS — E559 Vitamin D deficiency, unspecified: Secondary | ICD-10-CM | POA: Diagnosis not present

## 2020-06-17 DIAGNOSIS — M1710 Unilateral primary osteoarthritis, unspecified knee: Secondary | ICD-10-CM | POA: Diagnosis not present

## 2020-06-17 DIAGNOSIS — Z9181 History of falling: Secondary | ICD-10-CM | POA: Diagnosis not present

## 2020-06-17 DIAGNOSIS — I700 Atherosclerosis of aorta: Secondary | ICD-10-CM | POA: Diagnosis not present

## 2020-06-17 DIAGNOSIS — R809 Proteinuria, unspecified: Secondary | ICD-10-CM | POA: Diagnosis not present

## 2020-06-17 DIAGNOSIS — M5136 Other intervertebral disc degeneration, lumbar region: Secondary | ICD-10-CM | POA: Diagnosis not present

## 2020-06-17 DIAGNOSIS — J3489 Other specified disorders of nose and nasal sinuses: Secondary | ICD-10-CM | POA: Diagnosis not present

## 2020-06-17 DIAGNOSIS — E041 Nontoxic single thyroid nodule: Secondary | ICD-10-CM | POA: Diagnosis not present

## 2020-06-17 DIAGNOSIS — M47819 Spondylosis without myelopathy or radiculopathy, site unspecified: Secondary | ICD-10-CM | POA: Diagnosis not present

## 2020-06-17 DIAGNOSIS — G4700 Insomnia, unspecified: Secondary | ICD-10-CM | POA: Diagnosis not present

## 2020-06-17 DIAGNOSIS — I10 Essential (primary) hypertension: Secondary | ICD-10-CM | POA: Diagnosis not present

## 2020-06-17 DIAGNOSIS — E1129 Type 2 diabetes mellitus with other diabetic kidney complication: Secondary | ICD-10-CM | POA: Diagnosis not present

## 2020-06-17 DIAGNOSIS — F17210 Nicotine dependence, cigarettes, uncomplicated: Secondary | ICD-10-CM | POA: Diagnosis not present

## 2020-06-18 DIAGNOSIS — R609 Edema, unspecified: Secondary | ICD-10-CM | POA: Diagnosis not present

## 2020-06-18 DIAGNOSIS — K219 Gastro-esophageal reflux disease without esophagitis: Secondary | ICD-10-CM | POA: Diagnosis not present

## 2020-06-18 DIAGNOSIS — E871 Hypo-osmolality and hyponatremia: Secondary | ICD-10-CM | POA: Diagnosis not present

## 2020-06-18 DIAGNOSIS — F17200 Nicotine dependence, unspecified, uncomplicated: Secondary | ICD-10-CM | POA: Diagnosis not present

## 2020-06-18 DIAGNOSIS — J449 Chronic obstructive pulmonary disease, unspecified: Secondary | ICD-10-CM | POA: Diagnosis not present

## 2020-06-18 DIAGNOSIS — D72829 Elevated white blood cell count, unspecified: Secondary | ICD-10-CM | POA: Diagnosis not present

## 2020-06-19 DIAGNOSIS — Z7982 Long term (current) use of aspirin: Secondary | ICD-10-CM | POA: Diagnosis not present

## 2020-06-19 DIAGNOSIS — E871 Hypo-osmolality and hyponatremia: Secondary | ICD-10-CM | POA: Diagnosis not present

## 2020-06-19 DIAGNOSIS — I10 Essential (primary) hypertension: Secondary | ICD-10-CM | POA: Diagnosis not present

## 2020-06-19 DIAGNOSIS — F17210 Nicotine dependence, cigarettes, uncomplicated: Secondary | ICD-10-CM | POA: Diagnosis not present

## 2020-06-19 DIAGNOSIS — J449 Chronic obstructive pulmonary disease, unspecified: Secondary | ICD-10-CM | POA: Diagnosis not present

## 2020-06-19 DIAGNOSIS — Z9181 History of falling: Secondary | ICD-10-CM | POA: Diagnosis not present

## 2020-06-19 DIAGNOSIS — E782 Mixed hyperlipidemia: Secondary | ICD-10-CM | POA: Diagnosis not present

## 2020-06-19 DIAGNOSIS — E1140 Type 2 diabetes mellitus with diabetic neuropathy, unspecified: Secondary | ICD-10-CM | POA: Diagnosis not present

## 2020-06-19 DIAGNOSIS — R809 Proteinuria, unspecified: Secondary | ICD-10-CM | POA: Diagnosis not present

## 2020-06-19 DIAGNOSIS — K219 Gastro-esophageal reflux disease without esophagitis: Secondary | ICD-10-CM | POA: Diagnosis not present

## 2020-06-19 DIAGNOSIS — M1710 Unilateral primary osteoarthritis, unspecified knee: Secondary | ICD-10-CM | POA: Diagnosis not present

## 2020-06-19 DIAGNOSIS — M4800 Spinal stenosis, site unspecified: Secondary | ICD-10-CM | POA: Diagnosis not present

## 2020-06-19 DIAGNOSIS — I700 Atherosclerosis of aorta: Secondary | ICD-10-CM | POA: Diagnosis not present

## 2020-06-19 DIAGNOSIS — G4700 Insomnia, unspecified: Secondary | ICD-10-CM | POA: Diagnosis not present

## 2020-06-19 DIAGNOSIS — E1129 Type 2 diabetes mellitus with other diabetic kidney complication: Secondary | ICD-10-CM | POA: Diagnosis not present

## 2020-06-19 DIAGNOSIS — J3489 Other specified disorders of nose and nasal sinuses: Secondary | ICD-10-CM | POA: Diagnosis not present

## 2020-06-19 DIAGNOSIS — M47819 Spondylosis without myelopathy or radiculopathy, site unspecified: Secondary | ICD-10-CM | POA: Diagnosis not present

## 2020-06-19 DIAGNOSIS — E559 Vitamin D deficiency, unspecified: Secondary | ICD-10-CM | POA: Diagnosis not present

## 2020-06-19 DIAGNOSIS — G43909 Migraine, unspecified, not intractable, without status migrainosus: Secondary | ICD-10-CM | POA: Diagnosis not present

## 2020-06-19 DIAGNOSIS — M5136 Other intervertebral disc degeneration, lumbar region: Secondary | ICD-10-CM | POA: Diagnosis not present

## 2020-06-19 DIAGNOSIS — E041 Nontoxic single thyroid nodule: Secondary | ICD-10-CM | POA: Diagnosis not present

## 2020-06-19 DIAGNOSIS — Z7984 Long term (current) use of oral hypoglycemic drugs: Secondary | ICD-10-CM | POA: Diagnosis not present

## 2020-06-23 DIAGNOSIS — E782 Mixed hyperlipidemia: Secondary | ICD-10-CM | POA: Diagnosis not present

## 2020-06-23 DIAGNOSIS — Z7982 Long term (current) use of aspirin: Secondary | ICD-10-CM | POA: Diagnosis not present

## 2020-06-23 DIAGNOSIS — G43909 Migraine, unspecified, not intractable, without status migrainosus: Secondary | ICD-10-CM | POA: Diagnosis not present

## 2020-06-23 DIAGNOSIS — Z9181 History of falling: Secondary | ICD-10-CM | POA: Diagnosis not present

## 2020-06-23 DIAGNOSIS — M4800 Spinal stenosis, site unspecified: Secondary | ICD-10-CM | POA: Diagnosis not present

## 2020-06-23 DIAGNOSIS — E041 Nontoxic single thyroid nodule: Secondary | ICD-10-CM | POA: Diagnosis not present

## 2020-06-23 DIAGNOSIS — M5136 Other intervertebral disc degeneration, lumbar region: Secondary | ICD-10-CM | POA: Diagnosis not present

## 2020-06-23 DIAGNOSIS — K219 Gastro-esophageal reflux disease without esophagitis: Secondary | ICD-10-CM | POA: Diagnosis not present

## 2020-06-23 DIAGNOSIS — Z7984 Long term (current) use of oral hypoglycemic drugs: Secondary | ICD-10-CM | POA: Diagnosis not present

## 2020-06-23 DIAGNOSIS — E1140 Type 2 diabetes mellitus with diabetic neuropathy, unspecified: Secondary | ICD-10-CM | POA: Diagnosis not present

## 2020-06-23 DIAGNOSIS — F17210 Nicotine dependence, cigarettes, uncomplicated: Secondary | ICD-10-CM | POA: Diagnosis not present

## 2020-06-23 DIAGNOSIS — R809 Proteinuria, unspecified: Secondary | ICD-10-CM | POA: Diagnosis not present

## 2020-06-23 DIAGNOSIS — J3489 Other specified disorders of nose and nasal sinuses: Secondary | ICD-10-CM | POA: Diagnosis not present

## 2020-06-23 DIAGNOSIS — E559 Vitamin D deficiency, unspecified: Secondary | ICD-10-CM | POA: Diagnosis not present

## 2020-06-23 DIAGNOSIS — I10 Essential (primary) hypertension: Secondary | ICD-10-CM | POA: Diagnosis not present

## 2020-06-23 DIAGNOSIS — I700 Atherosclerosis of aorta: Secondary | ICD-10-CM | POA: Diagnosis not present

## 2020-06-23 DIAGNOSIS — G4700 Insomnia, unspecified: Secondary | ICD-10-CM | POA: Diagnosis not present

## 2020-06-23 DIAGNOSIS — M1710 Unilateral primary osteoarthritis, unspecified knee: Secondary | ICD-10-CM | POA: Diagnosis not present

## 2020-06-23 DIAGNOSIS — J449 Chronic obstructive pulmonary disease, unspecified: Secondary | ICD-10-CM | POA: Diagnosis not present

## 2020-06-23 DIAGNOSIS — E1129 Type 2 diabetes mellitus with other diabetic kidney complication: Secondary | ICD-10-CM | POA: Diagnosis not present

## 2020-06-23 DIAGNOSIS — E871 Hypo-osmolality and hyponatremia: Secondary | ICD-10-CM | POA: Diagnosis not present

## 2020-06-23 DIAGNOSIS — M47819 Spondylosis without myelopathy or radiculopathy, site unspecified: Secondary | ICD-10-CM | POA: Diagnosis not present

## 2020-06-24 DIAGNOSIS — M4800 Spinal stenosis, site unspecified: Secondary | ICD-10-CM | POA: Diagnosis not present

## 2020-06-24 DIAGNOSIS — G43909 Migraine, unspecified, not intractable, without status migrainosus: Secondary | ICD-10-CM | POA: Diagnosis not present

## 2020-06-24 DIAGNOSIS — M1710 Unilateral primary osteoarthritis, unspecified knee: Secondary | ICD-10-CM | POA: Diagnosis not present

## 2020-06-24 DIAGNOSIS — K219 Gastro-esophageal reflux disease without esophagitis: Secondary | ICD-10-CM | POA: Diagnosis not present

## 2020-06-24 DIAGNOSIS — I700 Atherosclerosis of aorta: Secondary | ICD-10-CM | POA: Diagnosis not present

## 2020-06-24 DIAGNOSIS — Z7984 Long term (current) use of oral hypoglycemic drugs: Secondary | ICD-10-CM | POA: Diagnosis not present

## 2020-06-24 DIAGNOSIS — F17210 Nicotine dependence, cigarettes, uncomplicated: Secondary | ICD-10-CM | POA: Diagnosis not present

## 2020-06-24 DIAGNOSIS — E1129 Type 2 diabetes mellitus with other diabetic kidney complication: Secondary | ICD-10-CM | POA: Diagnosis not present

## 2020-06-24 DIAGNOSIS — Z5181 Encounter for therapeutic drug level monitoring: Secondary | ICD-10-CM | POA: Diagnosis not present

## 2020-06-24 DIAGNOSIS — E782 Mixed hyperlipidemia: Secondary | ICD-10-CM | POA: Diagnosis not present

## 2020-06-24 DIAGNOSIS — R809 Proteinuria, unspecified: Secondary | ICD-10-CM | POA: Diagnosis not present

## 2020-06-24 DIAGNOSIS — Z9181 History of falling: Secondary | ICD-10-CM | POA: Diagnosis not present

## 2020-06-24 DIAGNOSIS — M5136 Other intervertebral disc degeneration, lumbar region: Secondary | ICD-10-CM | POA: Diagnosis not present

## 2020-06-24 DIAGNOSIS — E871 Hypo-osmolality and hyponatremia: Secondary | ICD-10-CM | POA: Diagnosis not present

## 2020-06-24 DIAGNOSIS — J3489 Other specified disorders of nose and nasal sinuses: Secondary | ICD-10-CM | POA: Diagnosis not present

## 2020-06-24 DIAGNOSIS — G4700 Insomnia, unspecified: Secondary | ICD-10-CM | POA: Diagnosis not present

## 2020-06-24 DIAGNOSIS — Z7982 Long term (current) use of aspirin: Secondary | ICD-10-CM | POA: Diagnosis not present

## 2020-06-24 DIAGNOSIS — E1140 Type 2 diabetes mellitus with diabetic neuropathy, unspecified: Secondary | ICD-10-CM | POA: Diagnosis not present

## 2020-06-24 DIAGNOSIS — M47819 Spondylosis without myelopathy or radiculopathy, site unspecified: Secondary | ICD-10-CM | POA: Diagnosis not present

## 2020-06-24 DIAGNOSIS — E041 Nontoxic single thyroid nodule: Secondary | ICD-10-CM | POA: Diagnosis not present

## 2020-06-24 DIAGNOSIS — I10 Essential (primary) hypertension: Secondary | ICD-10-CM | POA: Diagnosis not present

## 2020-06-24 DIAGNOSIS — J449 Chronic obstructive pulmonary disease, unspecified: Secondary | ICD-10-CM | POA: Diagnosis not present

## 2020-06-24 DIAGNOSIS — E559 Vitamin D deficiency, unspecified: Secondary | ICD-10-CM | POA: Diagnosis not present

## 2020-06-25 DIAGNOSIS — E1140 Type 2 diabetes mellitus with diabetic neuropathy, unspecified: Secondary | ICD-10-CM | POA: Diagnosis not present

## 2020-06-25 DIAGNOSIS — I10 Essential (primary) hypertension: Secondary | ICD-10-CM | POA: Diagnosis not present

## 2020-06-25 DIAGNOSIS — E871 Hypo-osmolality and hyponatremia: Secondary | ICD-10-CM | POA: Diagnosis not present

## 2020-06-25 DIAGNOSIS — E559 Vitamin D deficiency, unspecified: Secondary | ICD-10-CM | POA: Diagnosis not present

## 2020-06-25 DIAGNOSIS — Z7982 Long term (current) use of aspirin: Secondary | ICD-10-CM | POA: Diagnosis not present

## 2020-06-25 DIAGNOSIS — J3489 Other specified disorders of nose and nasal sinuses: Secondary | ICD-10-CM | POA: Diagnosis not present

## 2020-06-25 DIAGNOSIS — E1129 Type 2 diabetes mellitus with other diabetic kidney complication: Secondary | ICD-10-CM | POA: Diagnosis not present

## 2020-06-25 DIAGNOSIS — M47819 Spondylosis without myelopathy or radiculopathy, site unspecified: Secondary | ICD-10-CM | POA: Diagnosis not present

## 2020-06-25 DIAGNOSIS — I700 Atherosclerosis of aorta: Secondary | ICD-10-CM | POA: Diagnosis not present

## 2020-06-25 DIAGNOSIS — G43909 Migraine, unspecified, not intractable, without status migrainosus: Secondary | ICD-10-CM | POA: Diagnosis not present

## 2020-06-25 DIAGNOSIS — Z7984 Long term (current) use of oral hypoglycemic drugs: Secondary | ICD-10-CM | POA: Diagnosis not present

## 2020-06-25 DIAGNOSIS — K219 Gastro-esophageal reflux disease without esophagitis: Secondary | ICD-10-CM | POA: Diagnosis not present

## 2020-06-25 DIAGNOSIS — M5136 Other intervertebral disc degeneration, lumbar region: Secondary | ICD-10-CM | POA: Diagnosis not present

## 2020-06-25 DIAGNOSIS — E782 Mixed hyperlipidemia: Secondary | ICD-10-CM | POA: Diagnosis not present

## 2020-06-25 DIAGNOSIS — G4700 Insomnia, unspecified: Secondary | ICD-10-CM | POA: Diagnosis not present

## 2020-06-25 DIAGNOSIS — E041 Nontoxic single thyroid nodule: Secondary | ICD-10-CM | POA: Diagnosis not present

## 2020-06-25 DIAGNOSIS — R809 Proteinuria, unspecified: Secondary | ICD-10-CM | POA: Diagnosis not present

## 2020-06-25 DIAGNOSIS — Z9181 History of falling: Secondary | ICD-10-CM | POA: Diagnosis not present

## 2020-06-25 DIAGNOSIS — M1710 Unilateral primary osteoarthritis, unspecified knee: Secondary | ICD-10-CM | POA: Diagnosis not present

## 2020-06-25 DIAGNOSIS — M4800 Spinal stenosis, site unspecified: Secondary | ICD-10-CM | POA: Diagnosis not present

## 2020-06-25 DIAGNOSIS — F17210 Nicotine dependence, cigarettes, uncomplicated: Secondary | ICD-10-CM | POA: Diagnosis not present

## 2020-06-25 DIAGNOSIS — J449 Chronic obstructive pulmonary disease, unspecified: Secondary | ICD-10-CM | POA: Diagnosis not present

## 2020-06-26 DIAGNOSIS — G4700 Insomnia, unspecified: Secondary | ICD-10-CM | POA: Diagnosis not present

## 2020-06-26 DIAGNOSIS — M1710 Unilateral primary osteoarthritis, unspecified knee: Secondary | ICD-10-CM | POA: Diagnosis not present

## 2020-06-26 DIAGNOSIS — I700 Atherosclerosis of aorta: Secondary | ICD-10-CM | POA: Diagnosis not present

## 2020-06-26 DIAGNOSIS — M47819 Spondylosis without myelopathy or radiculopathy, site unspecified: Secondary | ICD-10-CM | POA: Diagnosis not present

## 2020-06-26 DIAGNOSIS — E1129 Type 2 diabetes mellitus with other diabetic kidney complication: Secondary | ICD-10-CM | POA: Diagnosis not present

## 2020-06-26 DIAGNOSIS — J449 Chronic obstructive pulmonary disease, unspecified: Secondary | ICD-10-CM | POA: Diagnosis not present

## 2020-06-26 DIAGNOSIS — Z7984 Long term (current) use of oral hypoglycemic drugs: Secondary | ICD-10-CM | POA: Diagnosis not present

## 2020-06-26 DIAGNOSIS — Z9181 History of falling: Secondary | ICD-10-CM | POA: Diagnosis not present

## 2020-06-26 DIAGNOSIS — J3489 Other specified disorders of nose and nasal sinuses: Secondary | ICD-10-CM | POA: Diagnosis not present

## 2020-06-26 DIAGNOSIS — K219 Gastro-esophageal reflux disease without esophagitis: Secondary | ICD-10-CM | POA: Diagnosis not present

## 2020-06-26 DIAGNOSIS — Z7982 Long term (current) use of aspirin: Secondary | ICD-10-CM | POA: Diagnosis not present

## 2020-06-26 DIAGNOSIS — E871 Hypo-osmolality and hyponatremia: Secondary | ICD-10-CM | POA: Diagnosis not present

## 2020-06-26 DIAGNOSIS — G43909 Migraine, unspecified, not intractable, without status migrainosus: Secondary | ICD-10-CM | POA: Diagnosis not present

## 2020-06-26 DIAGNOSIS — E041 Nontoxic single thyroid nodule: Secondary | ICD-10-CM | POA: Diagnosis not present

## 2020-06-26 DIAGNOSIS — E782 Mixed hyperlipidemia: Secondary | ICD-10-CM | POA: Diagnosis not present

## 2020-06-26 DIAGNOSIS — I10 Essential (primary) hypertension: Secondary | ICD-10-CM | POA: Diagnosis not present

## 2020-06-26 DIAGNOSIS — M4800 Spinal stenosis, site unspecified: Secondary | ICD-10-CM | POA: Diagnosis not present

## 2020-06-26 DIAGNOSIS — E1140 Type 2 diabetes mellitus with diabetic neuropathy, unspecified: Secondary | ICD-10-CM | POA: Diagnosis not present

## 2020-06-26 DIAGNOSIS — M5136 Other intervertebral disc degeneration, lumbar region: Secondary | ICD-10-CM | POA: Diagnosis not present

## 2020-06-26 DIAGNOSIS — E559 Vitamin D deficiency, unspecified: Secondary | ICD-10-CM | POA: Diagnosis not present

## 2020-06-26 DIAGNOSIS — R809 Proteinuria, unspecified: Secondary | ICD-10-CM | POA: Diagnosis not present

## 2020-06-26 DIAGNOSIS — F17210 Nicotine dependence, cigarettes, uncomplicated: Secondary | ICD-10-CM | POA: Diagnosis not present

## 2020-06-29 DIAGNOSIS — F17210 Nicotine dependence, cigarettes, uncomplicated: Secondary | ICD-10-CM | POA: Diagnosis not present

## 2020-06-29 DIAGNOSIS — I10 Essential (primary) hypertension: Secondary | ICD-10-CM | POA: Diagnosis not present

## 2020-06-29 DIAGNOSIS — M47819 Spondylosis without myelopathy or radiculopathy, site unspecified: Secondary | ICD-10-CM | POA: Diagnosis not present

## 2020-06-29 DIAGNOSIS — E559 Vitamin D deficiency, unspecified: Secondary | ICD-10-CM | POA: Diagnosis not present

## 2020-06-29 DIAGNOSIS — E871 Hypo-osmolality and hyponatremia: Secondary | ICD-10-CM | POA: Diagnosis not present

## 2020-06-29 DIAGNOSIS — G43909 Migraine, unspecified, not intractable, without status migrainosus: Secondary | ICD-10-CM | POA: Diagnosis not present

## 2020-06-29 DIAGNOSIS — G4700 Insomnia, unspecified: Secondary | ICD-10-CM | POA: Diagnosis not present

## 2020-06-29 DIAGNOSIS — Z7984 Long term (current) use of oral hypoglycemic drugs: Secondary | ICD-10-CM | POA: Diagnosis not present

## 2020-06-29 DIAGNOSIS — J449 Chronic obstructive pulmonary disease, unspecified: Secondary | ICD-10-CM | POA: Diagnosis not present

## 2020-06-29 DIAGNOSIS — J3489 Other specified disorders of nose and nasal sinuses: Secondary | ICD-10-CM | POA: Diagnosis not present

## 2020-06-29 DIAGNOSIS — E782 Mixed hyperlipidemia: Secondary | ICD-10-CM | POA: Diagnosis not present

## 2020-06-29 DIAGNOSIS — M1710 Unilateral primary osteoarthritis, unspecified knee: Secondary | ICD-10-CM | POA: Diagnosis not present

## 2020-06-29 DIAGNOSIS — M5136 Other intervertebral disc degeneration, lumbar region: Secondary | ICD-10-CM | POA: Diagnosis not present

## 2020-06-29 DIAGNOSIS — Z9181 History of falling: Secondary | ICD-10-CM | POA: Diagnosis not present

## 2020-06-29 DIAGNOSIS — R809 Proteinuria, unspecified: Secondary | ICD-10-CM | POA: Diagnosis not present

## 2020-06-29 DIAGNOSIS — K219 Gastro-esophageal reflux disease without esophagitis: Secondary | ICD-10-CM | POA: Diagnosis not present

## 2020-06-29 DIAGNOSIS — M4800 Spinal stenosis, site unspecified: Secondary | ICD-10-CM | POA: Diagnosis not present

## 2020-06-29 DIAGNOSIS — E1129 Type 2 diabetes mellitus with other diabetic kidney complication: Secondary | ICD-10-CM | POA: Diagnosis not present

## 2020-06-29 DIAGNOSIS — E1140 Type 2 diabetes mellitus with diabetic neuropathy, unspecified: Secondary | ICD-10-CM | POA: Diagnosis not present

## 2020-06-29 DIAGNOSIS — I700 Atherosclerosis of aorta: Secondary | ICD-10-CM | POA: Diagnosis not present

## 2020-06-29 DIAGNOSIS — Z7982 Long term (current) use of aspirin: Secondary | ICD-10-CM | POA: Diagnosis not present

## 2020-06-29 DIAGNOSIS — E041 Nontoxic single thyroid nodule: Secondary | ICD-10-CM | POA: Diagnosis not present

## 2020-06-30 DIAGNOSIS — E782 Mixed hyperlipidemia: Secondary | ICD-10-CM | POA: Diagnosis not present

## 2020-06-30 DIAGNOSIS — J3489 Other specified disorders of nose and nasal sinuses: Secondary | ICD-10-CM | POA: Diagnosis not present

## 2020-06-30 DIAGNOSIS — M1710 Unilateral primary osteoarthritis, unspecified knee: Secondary | ICD-10-CM | POA: Diagnosis not present

## 2020-06-30 DIAGNOSIS — E1129 Type 2 diabetes mellitus with other diabetic kidney complication: Secondary | ICD-10-CM | POA: Diagnosis not present

## 2020-06-30 DIAGNOSIS — M47819 Spondylosis without myelopathy or radiculopathy, site unspecified: Secondary | ICD-10-CM | POA: Diagnosis not present

## 2020-06-30 DIAGNOSIS — M4800 Spinal stenosis, site unspecified: Secondary | ICD-10-CM | POA: Diagnosis not present

## 2020-06-30 DIAGNOSIS — K219 Gastro-esophageal reflux disease without esophagitis: Secondary | ICD-10-CM | POA: Diagnosis not present

## 2020-06-30 DIAGNOSIS — G43909 Migraine, unspecified, not intractable, without status migrainosus: Secondary | ICD-10-CM | POA: Diagnosis not present

## 2020-06-30 DIAGNOSIS — E1140 Type 2 diabetes mellitus with diabetic neuropathy, unspecified: Secondary | ICD-10-CM | POA: Diagnosis not present

## 2020-06-30 DIAGNOSIS — E559 Vitamin D deficiency, unspecified: Secondary | ICD-10-CM | POA: Diagnosis not present

## 2020-06-30 DIAGNOSIS — G4700 Insomnia, unspecified: Secondary | ICD-10-CM | POA: Diagnosis not present

## 2020-06-30 DIAGNOSIS — Z9181 History of falling: Secondary | ICD-10-CM | POA: Diagnosis not present

## 2020-06-30 DIAGNOSIS — E041 Nontoxic single thyroid nodule: Secondary | ICD-10-CM | POA: Diagnosis not present

## 2020-06-30 DIAGNOSIS — M5136 Other intervertebral disc degeneration, lumbar region: Secondary | ICD-10-CM | POA: Diagnosis not present

## 2020-06-30 DIAGNOSIS — I700 Atherosclerosis of aorta: Secondary | ICD-10-CM | POA: Diagnosis not present

## 2020-06-30 DIAGNOSIS — Z5181 Encounter for therapeutic drug level monitoring: Secondary | ICD-10-CM | POA: Diagnosis not present

## 2020-06-30 DIAGNOSIS — R809 Proteinuria, unspecified: Secondary | ICD-10-CM | POA: Diagnosis not present

## 2020-06-30 DIAGNOSIS — F17210 Nicotine dependence, cigarettes, uncomplicated: Secondary | ICD-10-CM | POA: Diagnosis not present

## 2020-06-30 DIAGNOSIS — E871 Hypo-osmolality and hyponatremia: Secondary | ICD-10-CM | POA: Diagnosis not present

## 2020-06-30 DIAGNOSIS — Z7984 Long term (current) use of oral hypoglycemic drugs: Secondary | ICD-10-CM | POA: Diagnosis not present

## 2020-06-30 DIAGNOSIS — Z7982 Long term (current) use of aspirin: Secondary | ICD-10-CM | POA: Diagnosis not present

## 2020-06-30 DIAGNOSIS — I10 Essential (primary) hypertension: Secondary | ICD-10-CM | POA: Diagnosis not present

## 2020-06-30 DIAGNOSIS — J449 Chronic obstructive pulmonary disease, unspecified: Secondary | ICD-10-CM | POA: Diagnosis not present

## 2020-07-02 DIAGNOSIS — G4700 Insomnia, unspecified: Secondary | ICD-10-CM | POA: Diagnosis not present

## 2020-07-02 DIAGNOSIS — Z79899 Other long term (current) drug therapy: Secondary | ICD-10-CM | POA: Diagnosis not present

## 2020-07-07 DIAGNOSIS — F17210 Nicotine dependence, cigarettes, uncomplicated: Secondary | ICD-10-CM | POA: Diagnosis not present

## 2020-07-07 DIAGNOSIS — K219 Gastro-esophageal reflux disease without esophagitis: Secondary | ICD-10-CM | POA: Diagnosis not present

## 2020-07-07 DIAGNOSIS — E041 Nontoxic single thyroid nodule: Secondary | ICD-10-CM | POA: Diagnosis not present

## 2020-07-07 DIAGNOSIS — J449 Chronic obstructive pulmonary disease, unspecified: Secondary | ICD-10-CM | POA: Diagnosis not present

## 2020-07-07 DIAGNOSIS — E559 Vitamin D deficiency, unspecified: Secondary | ICD-10-CM | POA: Diagnosis not present

## 2020-07-07 DIAGNOSIS — Z7982 Long term (current) use of aspirin: Secondary | ICD-10-CM | POA: Diagnosis not present

## 2020-07-07 DIAGNOSIS — R809 Proteinuria, unspecified: Secondary | ICD-10-CM | POA: Diagnosis not present

## 2020-07-07 DIAGNOSIS — E1140 Type 2 diabetes mellitus with diabetic neuropathy, unspecified: Secondary | ICD-10-CM | POA: Diagnosis not present

## 2020-07-07 DIAGNOSIS — E1129 Type 2 diabetes mellitus with other diabetic kidney complication: Secondary | ICD-10-CM | POA: Diagnosis not present

## 2020-07-07 DIAGNOSIS — Z7984 Long term (current) use of oral hypoglycemic drugs: Secondary | ICD-10-CM | POA: Diagnosis not present

## 2020-07-07 DIAGNOSIS — I10 Essential (primary) hypertension: Secondary | ICD-10-CM | POA: Diagnosis not present

## 2020-07-07 DIAGNOSIS — E871 Hypo-osmolality and hyponatremia: Secondary | ICD-10-CM | POA: Diagnosis not present

## 2020-07-07 DIAGNOSIS — G4700 Insomnia, unspecified: Secondary | ICD-10-CM | POA: Diagnosis not present

## 2020-07-07 DIAGNOSIS — G43909 Migraine, unspecified, not intractable, without status migrainosus: Secondary | ICD-10-CM | POA: Diagnosis not present

## 2020-07-07 DIAGNOSIS — M4800 Spinal stenosis, site unspecified: Secondary | ICD-10-CM | POA: Diagnosis not present

## 2020-07-07 DIAGNOSIS — J3489 Other specified disorders of nose and nasal sinuses: Secondary | ICD-10-CM | POA: Diagnosis not present

## 2020-07-07 DIAGNOSIS — E782 Mixed hyperlipidemia: Secondary | ICD-10-CM | POA: Diagnosis not present

## 2020-07-07 DIAGNOSIS — M5136 Other intervertebral disc degeneration, lumbar region: Secondary | ICD-10-CM | POA: Diagnosis not present

## 2020-07-07 DIAGNOSIS — M47819 Spondylosis without myelopathy or radiculopathy, site unspecified: Secondary | ICD-10-CM | POA: Diagnosis not present

## 2020-07-07 DIAGNOSIS — Z9181 History of falling: Secondary | ICD-10-CM | POA: Diagnosis not present

## 2020-07-07 DIAGNOSIS — M1710 Unilateral primary osteoarthritis, unspecified knee: Secondary | ICD-10-CM | POA: Diagnosis not present

## 2020-07-07 DIAGNOSIS — I700 Atherosclerosis of aorta: Secondary | ICD-10-CM | POA: Diagnosis not present

## 2020-07-09 DIAGNOSIS — K219 Gastro-esophageal reflux disease without esophagitis: Secondary | ICD-10-CM | POA: Diagnosis not present

## 2020-07-09 DIAGNOSIS — Z9181 History of falling: Secondary | ICD-10-CM | POA: Diagnosis not present

## 2020-07-09 DIAGNOSIS — M47819 Spondylosis without myelopathy or radiculopathy, site unspecified: Secondary | ICD-10-CM | POA: Diagnosis not present

## 2020-07-09 DIAGNOSIS — I700 Atherosclerosis of aorta: Secondary | ICD-10-CM | POA: Diagnosis not present

## 2020-07-09 DIAGNOSIS — E041 Nontoxic single thyroid nodule: Secondary | ICD-10-CM | POA: Diagnosis not present

## 2020-07-09 DIAGNOSIS — E782 Mixed hyperlipidemia: Secondary | ICD-10-CM | POA: Diagnosis not present

## 2020-07-09 DIAGNOSIS — M1710 Unilateral primary osteoarthritis, unspecified knee: Secondary | ICD-10-CM | POA: Diagnosis not present

## 2020-07-09 DIAGNOSIS — J3489 Other specified disorders of nose and nasal sinuses: Secondary | ICD-10-CM | POA: Diagnosis not present

## 2020-07-09 DIAGNOSIS — J449 Chronic obstructive pulmonary disease, unspecified: Secondary | ICD-10-CM | POA: Diagnosis not present

## 2020-07-09 DIAGNOSIS — G43909 Migraine, unspecified, not intractable, without status migrainosus: Secondary | ICD-10-CM | POA: Diagnosis not present

## 2020-07-09 DIAGNOSIS — F17210 Nicotine dependence, cigarettes, uncomplicated: Secondary | ICD-10-CM | POA: Diagnosis not present

## 2020-07-09 DIAGNOSIS — R809 Proteinuria, unspecified: Secondary | ICD-10-CM | POA: Diagnosis not present

## 2020-07-09 DIAGNOSIS — M5136 Other intervertebral disc degeneration, lumbar region: Secondary | ICD-10-CM | POA: Diagnosis not present

## 2020-07-09 DIAGNOSIS — E871 Hypo-osmolality and hyponatremia: Secondary | ICD-10-CM | POA: Diagnosis not present

## 2020-07-09 DIAGNOSIS — I10 Essential (primary) hypertension: Secondary | ICD-10-CM | POA: Diagnosis not present

## 2020-07-09 DIAGNOSIS — E1140 Type 2 diabetes mellitus with diabetic neuropathy, unspecified: Secondary | ICD-10-CM | POA: Diagnosis not present

## 2020-07-09 DIAGNOSIS — E1129 Type 2 diabetes mellitus with other diabetic kidney complication: Secondary | ICD-10-CM | POA: Diagnosis not present

## 2020-07-09 DIAGNOSIS — M4800 Spinal stenosis, site unspecified: Secondary | ICD-10-CM | POA: Diagnosis not present

## 2020-07-09 DIAGNOSIS — E559 Vitamin D deficiency, unspecified: Secondary | ICD-10-CM | POA: Diagnosis not present

## 2020-07-09 DIAGNOSIS — Z7982 Long term (current) use of aspirin: Secondary | ICD-10-CM | POA: Diagnosis not present

## 2020-07-09 DIAGNOSIS — G4700 Insomnia, unspecified: Secondary | ICD-10-CM | POA: Diagnosis not present

## 2020-07-09 DIAGNOSIS — Z7984 Long term (current) use of oral hypoglycemic drugs: Secondary | ICD-10-CM | POA: Diagnosis not present

## 2020-07-14 DIAGNOSIS — I10 Essential (primary) hypertension: Secondary | ICD-10-CM | POA: Diagnosis not present

## 2020-07-14 DIAGNOSIS — M1710 Unilateral primary osteoarthritis, unspecified knee: Secondary | ICD-10-CM | POA: Diagnosis not present

## 2020-07-14 DIAGNOSIS — G43909 Migraine, unspecified, not intractable, without status migrainosus: Secondary | ICD-10-CM | POA: Diagnosis not present

## 2020-07-14 DIAGNOSIS — K219 Gastro-esophageal reflux disease without esophagitis: Secondary | ICD-10-CM | POA: Diagnosis not present

## 2020-07-14 DIAGNOSIS — E782 Mixed hyperlipidemia: Secondary | ICD-10-CM | POA: Diagnosis not present

## 2020-07-14 DIAGNOSIS — M5136 Other intervertebral disc degeneration, lumbar region: Secondary | ICD-10-CM | POA: Diagnosis not present

## 2020-07-14 DIAGNOSIS — J3489 Other specified disorders of nose and nasal sinuses: Secondary | ICD-10-CM | POA: Diagnosis not present

## 2020-07-14 DIAGNOSIS — M47819 Spondylosis without myelopathy or radiculopathy, site unspecified: Secondary | ICD-10-CM | POA: Diagnosis not present

## 2020-07-14 DIAGNOSIS — Z9181 History of falling: Secondary | ICD-10-CM | POA: Diagnosis not present

## 2020-07-14 DIAGNOSIS — Z7982 Long term (current) use of aspirin: Secondary | ICD-10-CM | POA: Diagnosis not present

## 2020-07-14 DIAGNOSIS — E041 Nontoxic single thyroid nodule: Secondary | ICD-10-CM | POA: Diagnosis not present

## 2020-07-14 DIAGNOSIS — J449 Chronic obstructive pulmonary disease, unspecified: Secondary | ICD-10-CM | POA: Diagnosis not present

## 2020-07-14 DIAGNOSIS — E871 Hypo-osmolality and hyponatremia: Secondary | ICD-10-CM | POA: Diagnosis not present

## 2020-07-14 DIAGNOSIS — G4700 Insomnia, unspecified: Secondary | ICD-10-CM | POA: Diagnosis not present

## 2020-07-14 DIAGNOSIS — Z7984 Long term (current) use of oral hypoglycemic drugs: Secondary | ICD-10-CM | POA: Diagnosis not present

## 2020-07-14 DIAGNOSIS — E1129 Type 2 diabetes mellitus with other diabetic kidney complication: Secondary | ICD-10-CM | POA: Diagnosis not present

## 2020-07-14 DIAGNOSIS — E1140 Type 2 diabetes mellitus with diabetic neuropathy, unspecified: Secondary | ICD-10-CM | POA: Diagnosis not present

## 2020-07-14 DIAGNOSIS — R809 Proteinuria, unspecified: Secondary | ICD-10-CM | POA: Diagnosis not present

## 2020-07-14 DIAGNOSIS — F17210 Nicotine dependence, cigarettes, uncomplicated: Secondary | ICD-10-CM | POA: Diagnosis not present

## 2020-07-14 DIAGNOSIS — M4800 Spinal stenosis, site unspecified: Secondary | ICD-10-CM | POA: Diagnosis not present

## 2020-07-14 DIAGNOSIS — I700 Atherosclerosis of aorta: Secondary | ICD-10-CM | POA: Diagnosis not present

## 2020-07-14 DIAGNOSIS — E559 Vitamin D deficiency, unspecified: Secondary | ICD-10-CM | POA: Diagnosis not present

## 2020-07-15 DIAGNOSIS — Z9181 History of falling: Secondary | ICD-10-CM | POA: Diagnosis not present

## 2020-07-15 DIAGNOSIS — G4700 Insomnia, unspecified: Secondary | ICD-10-CM | POA: Diagnosis not present

## 2020-07-15 DIAGNOSIS — F17210 Nicotine dependence, cigarettes, uncomplicated: Secondary | ICD-10-CM | POA: Diagnosis not present

## 2020-07-15 DIAGNOSIS — I700 Atherosclerosis of aorta: Secondary | ICD-10-CM | POA: Diagnosis not present

## 2020-07-15 DIAGNOSIS — G43909 Migraine, unspecified, not intractable, without status migrainosus: Secondary | ICD-10-CM | POA: Diagnosis not present

## 2020-07-15 DIAGNOSIS — K219 Gastro-esophageal reflux disease without esophagitis: Secondary | ICD-10-CM | POA: Diagnosis not present

## 2020-07-15 DIAGNOSIS — Z7982 Long term (current) use of aspirin: Secondary | ICD-10-CM | POA: Diagnosis not present

## 2020-07-15 DIAGNOSIS — J3489 Other specified disorders of nose and nasal sinuses: Secondary | ICD-10-CM | POA: Diagnosis not present

## 2020-07-15 DIAGNOSIS — E1129 Type 2 diabetes mellitus with other diabetic kidney complication: Secondary | ICD-10-CM | POA: Diagnosis not present

## 2020-07-15 DIAGNOSIS — E1140 Type 2 diabetes mellitus with diabetic neuropathy, unspecified: Secondary | ICD-10-CM | POA: Diagnosis not present

## 2020-07-15 DIAGNOSIS — Z7984 Long term (current) use of oral hypoglycemic drugs: Secondary | ICD-10-CM | POA: Diagnosis not present

## 2020-07-15 DIAGNOSIS — J449 Chronic obstructive pulmonary disease, unspecified: Secondary | ICD-10-CM | POA: Diagnosis not present

## 2020-07-15 DIAGNOSIS — E041 Nontoxic single thyroid nodule: Secondary | ICD-10-CM | POA: Diagnosis not present

## 2020-07-15 DIAGNOSIS — M5136 Other intervertebral disc degeneration, lumbar region: Secondary | ICD-10-CM | POA: Diagnosis not present

## 2020-07-15 DIAGNOSIS — E559 Vitamin D deficiency, unspecified: Secondary | ICD-10-CM | POA: Diagnosis not present

## 2020-07-15 DIAGNOSIS — M1710 Unilateral primary osteoarthritis, unspecified knee: Secondary | ICD-10-CM | POA: Diagnosis not present

## 2020-07-15 DIAGNOSIS — E782 Mixed hyperlipidemia: Secondary | ICD-10-CM | POA: Diagnosis not present

## 2020-07-15 DIAGNOSIS — M47819 Spondylosis without myelopathy or radiculopathy, site unspecified: Secondary | ICD-10-CM | POA: Diagnosis not present

## 2020-07-15 DIAGNOSIS — I10 Essential (primary) hypertension: Secondary | ICD-10-CM | POA: Diagnosis not present

## 2020-07-15 DIAGNOSIS — M4800 Spinal stenosis, site unspecified: Secondary | ICD-10-CM | POA: Diagnosis not present

## 2020-07-15 DIAGNOSIS — E871 Hypo-osmolality and hyponatremia: Secondary | ICD-10-CM | POA: Diagnosis not present

## 2020-07-15 DIAGNOSIS — R809 Proteinuria, unspecified: Secondary | ICD-10-CM | POA: Diagnosis not present

## 2020-07-21 DIAGNOSIS — E041 Nontoxic single thyroid nodule: Secondary | ICD-10-CM | POA: Diagnosis not present

## 2020-07-21 DIAGNOSIS — M1710 Unilateral primary osteoarthritis, unspecified knee: Secondary | ICD-10-CM | POA: Diagnosis not present

## 2020-07-21 DIAGNOSIS — I10 Essential (primary) hypertension: Secondary | ICD-10-CM | POA: Diagnosis not present

## 2020-07-21 DIAGNOSIS — J3489 Other specified disorders of nose and nasal sinuses: Secondary | ICD-10-CM | POA: Diagnosis not present

## 2020-07-21 DIAGNOSIS — R809 Proteinuria, unspecified: Secondary | ICD-10-CM | POA: Diagnosis not present

## 2020-07-21 DIAGNOSIS — K219 Gastro-esophageal reflux disease without esophagitis: Secondary | ICD-10-CM | POA: Diagnosis not present

## 2020-07-21 DIAGNOSIS — M5136 Other intervertebral disc degeneration, lumbar region: Secondary | ICD-10-CM | POA: Diagnosis not present

## 2020-07-21 DIAGNOSIS — Z7982 Long term (current) use of aspirin: Secondary | ICD-10-CM | POA: Diagnosis not present

## 2020-07-21 DIAGNOSIS — G43909 Migraine, unspecified, not intractable, without status migrainosus: Secondary | ICD-10-CM | POA: Diagnosis not present

## 2020-07-21 DIAGNOSIS — E782 Mixed hyperlipidemia: Secondary | ICD-10-CM | POA: Diagnosis not present

## 2020-07-21 DIAGNOSIS — E559 Vitamin D deficiency, unspecified: Secondary | ICD-10-CM | POA: Diagnosis not present

## 2020-07-21 DIAGNOSIS — F17210 Nicotine dependence, cigarettes, uncomplicated: Secondary | ICD-10-CM | POA: Diagnosis not present

## 2020-07-21 DIAGNOSIS — Z7984 Long term (current) use of oral hypoglycemic drugs: Secondary | ICD-10-CM | POA: Diagnosis not present

## 2020-07-21 DIAGNOSIS — Z9181 History of falling: Secondary | ICD-10-CM | POA: Diagnosis not present

## 2020-07-21 DIAGNOSIS — M4800 Spinal stenosis, site unspecified: Secondary | ICD-10-CM | POA: Diagnosis not present

## 2020-07-21 DIAGNOSIS — E1140 Type 2 diabetes mellitus with diabetic neuropathy, unspecified: Secondary | ICD-10-CM | POA: Diagnosis not present

## 2020-07-21 DIAGNOSIS — M47819 Spondylosis without myelopathy or radiculopathy, site unspecified: Secondary | ICD-10-CM | POA: Diagnosis not present

## 2020-07-21 DIAGNOSIS — I700 Atherosclerosis of aorta: Secondary | ICD-10-CM | POA: Diagnosis not present

## 2020-07-21 DIAGNOSIS — G4700 Insomnia, unspecified: Secondary | ICD-10-CM | POA: Diagnosis not present

## 2020-07-21 DIAGNOSIS — E871 Hypo-osmolality and hyponatremia: Secondary | ICD-10-CM | POA: Diagnosis not present

## 2020-07-21 DIAGNOSIS — J449 Chronic obstructive pulmonary disease, unspecified: Secondary | ICD-10-CM | POA: Diagnosis not present

## 2020-07-21 DIAGNOSIS — E1129 Type 2 diabetes mellitus with other diabetic kidney complication: Secondary | ICD-10-CM | POA: Diagnosis not present

## 2020-07-24 DIAGNOSIS — Z9181 History of falling: Secondary | ICD-10-CM | POA: Diagnosis not present

## 2020-07-24 DIAGNOSIS — J449 Chronic obstructive pulmonary disease, unspecified: Secondary | ICD-10-CM | POA: Diagnosis not present

## 2020-07-24 DIAGNOSIS — E1129 Type 2 diabetes mellitus with other diabetic kidney complication: Secondary | ICD-10-CM | POA: Diagnosis not present

## 2020-07-24 DIAGNOSIS — F17210 Nicotine dependence, cigarettes, uncomplicated: Secondary | ICD-10-CM | POA: Diagnosis not present

## 2020-07-24 DIAGNOSIS — M47819 Spondylosis without myelopathy or radiculopathy, site unspecified: Secondary | ICD-10-CM | POA: Diagnosis not present

## 2020-07-24 DIAGNOSIS — M1710 Unilateral primary osteoarthritis, unspecified knee: Secondary | ICD-10-CM | POA: Diagnosis not present

## 2020-07-24 DIAGNOSIS — E782 Mixed hyperlipidemia: Secondary | ICD-10-CM | POA: Diagnosis not present

## 2020-07-24 DIAGNOSIS — E871 Hypo-osmolality and hyponatremia: Secondary | ICD-10-CM | POA: Diagnosis not present

## 2020-07-24 DIAGNOSIS — E559 Vitamin D deficiency, unspecified: Secondary | ICD-10-CM | POA: Diagnosis not present

## 2020-07-24 DIAGNOSIS — I700 Atherosclerosis of aorta: Secondary | ICD-10-CM | POA: Diagnosis not present

## 2020-07-24 DIAGNOSIS — E041 Nontoxic single thyroid nodule: Secondary | ICD-10-CM | POA: Diagnosis not present

## 2020-07-24 DIAGNOSIS — G4700 Insomnia, unspecified: Secondary | ICD-10-CM | POA: Diagnosis not present

## 2020-07-24 DIAGNOSIS — J3489 Other specified disorders of nose and nasal sinuses: Secondary | ICD-10-CM | POA: Diagnosis not present

## 2020-07-24 DIAGNOSIS — K219 Gastro-esophageal reflux disease without esophagitis: Secondary | ICD-10-CM | POA: Diagnosis not present

## 2020-07-24 DIAGNOSIS — Z7982 Long term (current) use of aspirin: Secondary | ICD-10-CM | POA: Diagnosis not present

## 2020-07-24 DIAGNOSIS — I10 Essential (primary) hypertension: Secondary | ICD-10-CM | POA: Diagnosis not present

## 2020-07-24 DIAGNOSIS — E1140 Type 2 diabetes mellitus with diabetic neuropathy, unspecified: Secondary | ICD-10-CM | POA: Diagnosis not present

## 2020-07-24 DIAGNOSIS — M5136 Other intervertebral disc degeneration, lumbar region: Secondary | ICD-10-CM | POA: Diagnosis not present

## 2020-07-24 DIAGNOSIS — R809 Proteinuria, unspecified: Secondary | ICD-10-CM | POA: Diagnosis not present

## 2020-07-24 DIAGNOSIS — M4800 Spinal stenosis, site unspecified: Secondary | ICD-10-CM | POA: Diagnosis not present

## 2020-07-24 DIAGNOSIS — G43909 Migraine, unspecified, not intractable, without status migrainosus: Secondary | ICD-10-CM | POA: Diagnosis not present

## 2020-07-24 DIAGNOSIS — Z7984 Long term (current) use of oral hypoglycemic drugs: Secondary | ICD-10-CM | POA: Diagnosis not present

## 2020-07-28 DIAGNOSIS — G43909 Migraine, unspecified, not intractable, without status migrainosus: Secondary | ICD-10-CM | POA: Diagnosis not present

## 2020-07-28 DIAGNOSIS — M4800 Spinal stenosis, site unspecified: Secondary | ICD-10-CM | POA: Diagnosis not present

## 2020-07-28 DIAGNOSIS — Z7982 Long term (current) use of aspirin: Secondary | ICD-10-CM | POA: Diagnosis not present

## 2020-07-28 DIAGNOSIS — E1129 Type 2 diabetes mellitus with other diabetic kidney complication: Secondary | ICD-10-CM | POA: Diagnosis not present

## 2020-07-28 DIAGNOSIS — J449 Chronic obstructive pulmonary disease, unspecified: Secondary | ICD-10-CM | POA: Diagnosis not present

## 2020-07-28 DIAGNOSIS — E782 Mixed hyperlipidemia: Secondary | ICD-10-CM | POA: Diagnosis not present

## 2020-07-28 DIAGNOSIS — F17210 Nicotine dependence, cigarettes, uncomplicated: Secondary | ICD-10-CM | POA: Diagnosis not present

## 2020-07-28 DIAGNOSIS — J3489 Other specified disorders of nose and nasal sinuses: Secondary | ICD-10-CM | POA: Diagnosis not present

## 2020-07-28 DIAGNOSIS — Z7984 Long term (current) use of oral hypoglycemic drugs: Secondary | ICD-10-CM | POA: Diagnosis not present

## 2020-07-28 DIAGNOSIS — E559 Vitamin D deficiency, unspecified: Secondary | ICD-10-CM | POA: Diagnosis not present

## 2020-07-28 DIAGNOSIS — M1710 Unilateral primary osteoarthritis, unspecified knee: Secondary | ICD-10-CM | POA: Diagnosis not present

## 2020-07-28 DIAGNOSIS — M47819 Spondylosis without myelopathy or radiculopathy, site unspecified: Secondary | ICD-10-CM | POA: Diagnosis not present

## 2020-07-28 DIAGNOSIS — G4700 Insomnia, unspecified: Secondary | ICD-10-CM | POA: Diagnosis not present

## 2020-07-28 DIAGNOSIS — R809 Proteinuria, unspecified: Secondary | ICD-10-CM | POA: Diagnosis not present

## 2020-07-28 DIAGNOSIS — E1140 Type 2 diabetes mellitus with diabetic neuropathy, unspecified: Secondary | ICD-10-CM | POA: Diagnosis not present

## 2020-07-28 DIAGNOSIS — I10 Essential (primary) hypertension: Secondary | ICD-10-CM | POA: Diagnosis not present

## 2020-07-28 DIAGNOSIS — K219 Gastro-esophageal reflux disease without esophagitis: Secondary | ICD-10-CM | POA: Diagnosis not present

## 2020-07-28 DIAGNOSIS — E041 Nontoxic single thyroid nodule: Secondary | ICD-10-CM | POA: Diagnosis not present

## 2020-07-28 DIAGNOSIS — Z9181 History of falling: Secondary | ICD-10-CM | POA: Diagnosis not present

## 2020-07-28 DIAGNOSIS — I700 Atherosclerosis of aorta: Secondary | ICD-10-CM | POA: Diagnosis not present

## 2020-07-28 DIAGNOSIS — M5136 Other intervertebral disc degeneration, lumbar region: Secondary | ICD-10-CM | POA: Diagnosis not present

## 2020-07-28 DIAGNOSIS — E871 Hypo-osmolality and hyponatremia: Secondary | ICD-10-CM | POA: Diagnosis not present

## 2020-07-29 DIAGNOSIS — G43909 Migraine, unspecified, not intractable, without status migrainosus: Secondary | ICD-10-CM | POA: Diagnosis not present

## 2020-07-29 DIAGNOSIS — R809 Proteinuria, unspecified: Secondary | ICD-10-CM | POA: Diagnosis not present

## 2020-07-29 DIAGNOSIS — J3489 Other specified disorders of nose and nasal sinuses: Secondary | ICD-10-CM | POA: Diagnosis not present

## 2020-07-29 DIAGNOSIS — M47819 Spondylosis without myelopathy or radiculopathy, site unspecified: Secondary | ICD-10-CM | POA: Diagnosis not present

## 2020-07-29 DIAGNOSIS — M5136 Other intervertebral disc degeneration, lumbar region: Secondary | ICD-10-CM | POA: Diagnosis not present

## 2020-07-29 DIAGNOSIS — G4700 Insomnia, unspecified: Secondary | ICD-10-CM | POA: Diagnosis not present

## 2020-07-29 DIAGNOSIS — E871 Hypo-osmolality and hyponatremia: Secondary | ICD-10-CM | POA: Diagnosis not present

## 2020-07-29 DIAGNOSIS — K219 Gastro-esophageal reflux disease without esophagitis: Secondary | ICD-10-CM | POA: Diagnosis not present

## 2020-07-29 DIAGNOSIS — M4800 Spinal stenosis, site unspecified: Secondary | ICD-10-CM | POA: Diagnosis not present

## 2020-07-29 DIAGNOSIS — E559 Vitamin D deficiency, unspecified: Secondary | ICD-10-CM | POA: Diagnosis not present

## 2020-07-29 DIAGNOSIS — E041 Nontoxic single thyroid nodule: Secondary | ICD-10-CM | POA: Diagnosis not present

## 2020-07-29 DIAGNOSIS — Z9181 History of falling: Secondary | ICD-10-CM | POA: Diagnosis not present

## 2020-07-29 DIAGNOSIS — E1140 Type 2 diabetes mellitus with diabetic neuropathy, unspecified: Secondary | ICD-10-CM | POA: Diagnosis not present

## 2020-07-29 DIAGNOSIS — J449 Chronic obstructive pulmonary disease, unspecified: Secondary | ICD-10-CM | POA: Diagnosis not present

## 2020-07-29 DIAGNOSIS — E782 Mixed hyperlipidemia: Secondary | ICD-10-CM | POA: Diagnosis not present

## 2020-07-29 DIAGNOSIS — Z7984 Long term (current) use of oral hypoglycemic drugs: Secondary | ICD-10-CM | POA: Diagnosis not present

## 2020-07-29 DIAGNOSIS — M1710 Unilateral primary osteoarthritis, unspecified knee: Secondary | ICD-10-CM | POA: Diagnosis not present

## 2020-07-29 DIAGNOSIS — F17210 Nicotine dependence, cigarettes, uncomplicated: Secondary | ICD-10-CM | POA: Diagnosis not present

## 2020-07-29 DIAGNOSIS — I700 Atherosclerosis of aorta: Secondary | ICD-10-CM | POA: Diagnosis not present

## 2020-07-29 DIAGNOSIS — Z7982 Long term (current) use of aspirin: Secondary | ICD-10-CM | POA: Diagnosis not present

## 2020-07-29 DIAGNOSIS — I10 Essential (primary) hypertension: Secondary | ICD-10-CM | POA: Diagnosis not present

## 2020-07-29 DIAGNOSIS — E1129 Type 2 diabetes mellitus with other diabetic kidney complication: Secondary | ICD-10-CM | POA: Diagnosis not present

## 2020-07-29 DIAGNOSIS — F17200 Nicotine dependence, unspecified, uncomplicated: Secondary | ICD-10-CM | POA: Diagnosis not present

## 2020-07-30 DIAGNOSIS — F17200 Nicotine dependence, unspecified, uncomplicated: Secondary | ICD-10-CM | POA: Diagnosis not present

## 2020-07-30 DIAGNOSIS — E871 Hypo-osmolality and hyponatremia: Secondary | ICD-10-CM | POA: Diagnosis not present

## 2020-07-30 DIAGNOSIS — R809 Proteinuria, unspecified: Secondary | ICD-10-CM | POA: Diagnosis not present

## 2020-07-30 DIAGNOSIS — E782 Mixed hyperlipidemia: Secondary | ICD-10-CM | POA: Diagnosis not present

## 2020-07-30 DIAGNOSIS — M791 Myalgia, unspecified site: Secondary | ICD-10-CM | POA: Diagnosis not present

## 2020-07-30 DIAGNOSIS — E1129 Type 2 diabetes mellitus with other diabetic kidney complication: Secondary | ICD-10-CM | POA: Diagnosis not present

## 2020-07-30 DIAGNOSIS — I10 Essential (primary) hypertension: Secondary | ICD-10-CM | POA: Diagnosis not present

## 2020-07-30 DIAGNOSIS — K219 Gastro-esophageal reflux disease without esophagitis: Secondary | ICD-10-CM | POA: Diagnosis not present

## 2020-08-04 DIAGNOSIS — E1129 Type 2 diabetes mellitus with other diabetic kidney complication: Secondary | ICD-10-CM | POA: Diagnosis not present

## 2020-08-04 DIAGNOSIS — K219 Gastro-esophageal reflux disease without esophagitis: Secondary | ICD-10-CM | POA: Diagnosis not present

## 2020-08-04 DIAGNOSIS — E782 Mixed hyperlipidemia: Secondary | ICD-10-CM | POA: Diagnosis not present

## 2020-08-04 DIAGNOSIS — M4800 Spinal stenosis, site unspecified: Secondary | ICD-10-CM | POA: Diagnosis not present

## 2020-08-04 DIAGNOSIS — J3489 Other specified disorders of nose and nasal sinuses: Secondary | ICD-10-CM | POA: Diagnosis not present

## 2020-08-04 DIAGNOSIS — F17210 Nicotine dependence, cigarettes, uncomplicated: Secondary | ICD-10-CM | POA: Diagnosis not present

## 2020-08-04 DIAGNOSIS — M47819 Spondylosis without myelopathy or radiculopathy, site unspecified: Secondary | ICD-10-CM | POA: Diagnosis not present

## 2020-08-04 DIAGNOSIS — E559 Vitamin D deficiency, unspecified: Secondary | ICD-10-CM | POA: Diagnosis not present

## 2020-08-04 DIAGNOSIS — E041 Nontoxic single thyroid nodule: Secondary | ICD-10-CM | POA: Diagnosis not present

## 2020-08-04 DIAGNOSIS — Z7984 Long term (current) use of oral hypoglycemic drugs: Secondary | ICD-10-CM | POA: Diagnosis not present

## 2020-08-04 DIAGNOSIS — Z9181 History of falling: Secondary | ICD-10-CM | POA: Diagnosis not present

## 2020-08-04 DIAGNOSIS — R809 Proteinuria, unspecified: Secondary | ICD-10-CM | POA: Diagnosis not present

## 2020-08-04 DIAGNOSIS — G4700 Insomnia, unspecified: Secondary | ICD-10-CM | POA: Diagnosis not present

## 2020-08-04 DIAGNOSIS — E871 Hypo-osmolality and hyponatremia: Secondary | ICD-10-CM | POA: Diagnosis not present

## 2020-08-04 DIAGNOSIS — J449 Chronic obstructive pulmonary disease, unspecified: Secondary | ICD-10-CM | POA: Diagnosis not present

## 2020-08-04 DIAGNOSIS — I700 Atherosclerosis of aorta: Secondary | ICD-10-CM | POA: Diagnosis not present

## 2020-08-04 DIAGNOSIS — M5136 Other intervertebral disc degeneration, lumbar region: Secondary | ICD-10-CM | POA: Diagnosis not present

## 2020-08-04 DIAGNOSIS — Z7982 Long term (current) use of aspirin: Secondary | ICD-10-CM | POA: Diagnosis not present

## 2020-08-04 DIAGNOSIS — G43909 Migraine, unspecified, not intractable, without status migrainosus: Secondary | ICD-10-CM | POA: Diagnosis not present

## 2020-08-04 DIAGNOSIS — I10 Essential (primary) hypertension: Secondary | ICD-10-CM | POA: Diagnosis not present

## 2020-08-04 DIAGNOSIS — M1710 Unilateral primary osteoarthritis, unspecified knee: Secondary | ICD-10-CM | POA: Diagnosis not present

## 2020-08-04 DIAGNOSIS — E1140 Type 2 diabetes mellitus with diabetic neuropathy, unspecified: Secondary | ICD-10-CM | POA: Diagnosis not present

## 2020-08-05 DIAGNOSIS — E871 Hypo-osmolality and hyponatremia: Secondary | ICD-10-CM | POA: Diagnosis not present

## 2020-08-05 DIAGNOSIS — F17210 Nicotine dependence, cigarettes, uncomplicated: Secondary | ICD-10-CM | POA: Diagnosis not present

## 2020-08-05 DIAGNOSIS — M47819 Spondylosis without myelopathy or radiculopathy, site unspecified: Secondary | ICD-10-CM | POA: Diagnosis not present

## 2020-08-05 DIAGNOSIS — J449 Chronic obstructive pulmonary disease, unspecified: Secondary | ICD-10-CM | POA: Diagnosis not present

## 2020-08-05 DIAGNOSIS — M1710 Unilateral primary osteoarthritis, unspecified knee: Secondary | ICD-10-CM | POA: Diagnosis not present

## 2020-08-05 DIAGNOSIS — J3489 Other specified disorders of nose and nasal sinuses: Secondary | ICD-10-CM | POA: Diagnosis not present

## 2020-08-05 DIAGNOSIS — E559 Vitamin D deficiency, unspecified: Secondary | ICD-10-CM | POA: Diagnosis not present

## 2020-08-05 DIAGNOSIS — I10 Essential (primary) hypertension: Secondary | ICD-10-CM | POA: Diagnosis not present

## 2020-08-05 DIAGNOSIS — Z7982 Long term (current) use of aspirin: Secondary | ICD-10-CM | POA: Diagnosis not present

## 2020-08-05 DIAGNOSIS — G43909 Migraine, unspecified, not intractable, without status migrainosus: Secondary | ICD-10-CM | POA: Diagnosis not present

## 2020-08-05 DIAGNOSIS — M4800 Spinal stenosis, site unspecified: Secondary | ICD-10-CM | POA: Diagnosis not present

## 2020-08-05 DIAGNOSIS — E1129 Type 2 diabetes mellitus with other diabetic kidney complication: Secondary | ICD-10-CM | POA: Diagnosis not present

## 2020-08-05 DIAGNOSIS — G4700 Insomnia, unspecified: Secondary | ICD-10-CM | POA: Diagnosis not present

## 2020-08-05 DIAGNOSIS — K219 Gastro-esophageal reflux disease without esophagitis: Secondary | ICD-10-CM | POA: Diagnosis not present

## 2020-08-05 DIAGNOSIS — E1140 Type 2 diabetes mellitus with diabetic neuropathy, unspecified: Secondary | ICD-10-CM | POA: Diagnosis not present

## 2020-08-05 DIAGNOSIS — I700 Atherosclerosis of aorta: Secondary | ICD-10-CM | POA: Diagnosis not present

## 2020-08-05 DIAGNOSIS — Z7984 Long term (current) use of oral hypoglycemic drugs: Secondary | ICD-10-CM | POA: Diagnosis not present

## 2020-08-05 DIAGNOSIS — E041 Nontoxic single thyroid nodule: Secondary | ICD-10-CM | POA: Diagnosis not present

## 2020-08-05 DIAGNOSIS — E782 Mixed hyperlipidemia: Secondary | ICD-10-CM | POA: Diagnosis not present

## 2020-08-05 DIAGNOSIS — R809 Proteinuria, unspecified: Secondary | ICD-10-CM | POA: Diagnosis not present

## 2020-08-05 DIAGNOSIS — M5136 Other intervertebral disc degeneration, lumbar region: Secondary | ICD-10-CM | POA: Diagnosis not present

## 2020-08-05 DIAGNOSIS — Z9181 History of falling: Secondary | ICD-10-CM | POA: Diagnosis not present

## 2020-08-11 DIAGNOSIS — F17210 Nicotine dependence, cigarettes, uncomplicated: Secondary | ICD-10-CM | POA: Diagnosis not present

## 2020-08-11 DIAGNOSIS — E559 Vitamin D deficiency, unspecified: Secondary | ICD-10-CM | POA: Diagnosis not present

## 2020-08-11 DIAGNOSIS — G4700 Insomnia, unspecified: Secondary | ICD-10-CM | POA: Diagnosis not present

## 2020-08-11 DIAGNOSIS — E1129 Type 2 diabetes mellitus with other diabetic kidney complication: Secondary | ICD-10-CM | POA: Diagnosis not present

## 2020-08-11 DIAGNOSIS — Z9181 History of falling: Secondary | ICD-10-CM | POA: Diagnosis not present

## 2020-08-11 DIAGNOSIS — M4800 Spinal stenosis, site unspecified: Secondary | ICD-10-CM | POA: Diagnosis not present

## 2020-08-11 DIAGNOSIS — E871 Hypo-osmolality and hyponatremia: Secondary | ICD-10-CM | POA: Diagnosis not present

## 2020-08-11 DIAGNOSIS — J449 Chronic obstructive pulmonary disease, unspecified: Secondary | ICD-10-CM | POA: Diagnosis not present

## 2020-08-11 DIAGNOSIS — R809 Proteinuria, unspecified: Secondary | ICD-10-CM | POA: Diagnosis not present

## 2020-08-11 DIAGNOSIS — E1140 Type 2 diabetes mellitus with diabetic neuropathy, unspecified: Secondary | ICD-10-CM | POA: Diagnosis not present

## 2020-08-11 DIAGNOSIS — I700 Atherosclerosis of aorta: Secondary | ICD-10-CM | POA: Diagnosis not present

## 2020-08-11 DIAGNOSIS — E041 Nontoxic single thyroid nodule: Secondary | ICD-10-CM | POA: Diagnosis not present

## 2020-08-11 DIAGNOSIS — I10 Essential (primary) hypertension: Secondary | ICD-10-CM | POA: Diagnosis not present

## 2020-08-11 DIAGNOSIS — E782 Mixed hyperlipidemia: Secondary | ICD-10-CM | POA: Diagnosis not present

## 2020-08-11 DIAGNOSIS — M1710 Unilateral primary osteoarthritis, unspecified knee: Secondary | ICD-10-CM | POA: Diagnosis not present

## 2020-08-11 DIAGNOSIS — Z7982 Long term (current) use of aspirin: Secondary | ICD-10-CM | POA: Diagnosis not present

## 2020-08-11 DIAGNOSIS — Z7984 Long term (current) use of oral hypoglycemic drugs: Secondary | ICD-10-CM | POA: Diagnosis not present

## 2020-08-11 DIAGNOSIS — K219 Gastro-esophageal reflux disease without esophagitis: Secondary | ICD-10-CM | POA: Diagnosis not present

## 2020-08-11 DIAGNOSIS — M5136 Other intervertebral disc degeneration, lumbar region: Secondary | ICD-10-CM | POA: Diagnosis not present

## 2020-08-11 DIAGNOSIS — J3489 Other specified disorders of nose and nasal sinuses: Secondary | ICD-10-CM | POA: Diagnosis not present

## 2020-08-11 DIAGNOSIS — M47819 Spondylosis without myelopathy or radiculopathy, site unspecified: Secondary | ICD-10-CM | POA: Diagnosis not present

## 2020-08-11 DIAGNOSIS — G43909 Migraine, unspecified, not intractable, without status migrainosus: Secondary | ICD-10-CM | POA: Diagnosis not present

## 2020-08-12 DIAGNOSIS — Z7984 Long term (current) use of oral hypoglycemic drugs: Secondary | ICD-10-CM | POA: Diagnosis not present

## 2020-08-12 DIAGNOSIS — M1710 Unilateral primary osteoarthritis, unspecified knee: Secondary | ICD-10-CM | POA: Diagnosis not present

## 2020-08-12 DIAGNOSIS — M47819 Spondylosis without myelopathy or radiculopathy, site unspecified: Secondary | ICD-10-CM | POA: Diagnosis not present

## 2020-08-12 DIAGNOSIS — J3489 Other specified disorders of nose and nasal sinuses: Secondary | ICD-10-CM | POA: Diagnosis not present

## 2020-08-12 DIAGNOSIS — G43909 Migraine, unspecified, not intractable, without status migrainosus: Secondary | ICD-10-CM | POA: Diagnosis not present

## 2020-08-12 DIAGNOSIS — I700 Atherosclerosis of aorta: Secondary | ICD-10-CM | POA: Diagnosis not present

## 2020-08-12 DIAGNOSIS — E559 Vitamin D deficiency, unspecified: Secondary | ICD-10-CM | POA: Diagnosis not present

## 2020-08-12 DIAGNOSIS — G4700 Insomnia, unspecified: Secondary | ICD-10-CM | POA: Diagnosis not present

## 2020-08-12 DIAGNOSIS — Z7982 Long term (current) use of aspirin: Secondary | ICD-10-CM | POA: Diagnosis not present

## 2020-08-12 DIAGNOSIS — K219 Gastro-esophageal reflux disease without esophagitis: Secondary | ICD-10-CM | POA: Diagnosis not present

## 2020-08-12 DIAGNOSIS — E871 Hypo-osmolality and hyponatremia: Secondary | ICD-10-CM | POA: Diagnosis not present

## 2020-08-12 DIAGNOSIS — R809 Proteinuria, unspecified: Secondary | ICD-10-CM | POA: Diagnosis not present

## 2020-08-12 DIAGNOSIS — M4800 Spinal stenosis, site unspecified: Secondary | ICD-10-CM | POA: Diagnosis not present

## 2020-08-12 DIAGNOSIS — E041 Nontoxic single thyroid nodule: Secondary | ICD-10-CM | POA: Diagnosis not present

## 2020-08-12 DIAGNOSIS — M5136 Other intervertebral disc degeneration, lumbar region: Secondary | ICD-10-CM | POA: Diagnosis not present

## 2020-08-12 DIAGNOSIS — E1129 Type 2 diabetes mellitus with other diabetic kidney complication: Secondary | ICD-10-CM | POA: Diagnosis not present

## 2020-08-12 DIAGNOSIS — F17210 Nicotine dependence, cigarettes, uncomplicated: Secondary | ICD-10-CM | POA: Diagnosis not present

## 2020-08-12 DIAGNOSIS — E1140 Type 2 diabetes mellitus with diabetic neuropathy, unspecified: Secondary | ICD-10-CM | POA: Diagnosis not present

## 2020-08-12 DIAGNOSIS — I10 Essential (primary) hypertension: Secondary | ICD-10-CM | POA: Diagnosis not present

## 2020-08-12 DIAGNOSIS — E782 Mixed hyperlipidemia: Secondary | ICD-10-CM | POA: Diagnosis not present

## 2020-08-12 DIAGNOSIS — Z9181 History of falling: Secondary | ICD-10-CM | POA: Diagnosis not present

## 2020-08-12 DIAGNOSIS — J449 Chronic obstructive pulmonary disease, unspecified: Secondary | ICD-10-CM | POA: Diagnosis not present

## 2020-08-17 DIAGNOSIS — R600 Localized edema: Secondary | ICD-10-CM | POA: Diagnosis not present

## 2020-08-17 DIAGNOSIS — M79605 Pain in left leg: Secondary | ICD-10-CM | POA: Diagnosis not present

## 2020-08-17 DIAGNOSIS — M79604 Pain in right leg: Secondary | ICD-10-CM | POA: Diagnosis not present

## 2020-08-20 ENCOUNTER — Other Ambulatory Visit: Payer: Self-pay | Admitting: Family Medicine

## 2020-08-20 DIAGNOSIS — K219 Gastro-esophageal reflux disease without esophagitis: Secondary | ICD-10-CM | POA: Diagnosis not present

## 2020-08-20 DIAGNOSIS — E1140 Type 2 diabetes mellitus with diabetic neuropathy, unspecified: Secondary | ICD-10-CM | POA: Diagnosis not present

## 2020-08-20 DIAGNOSIS — M1710 Unilateral primary osteoarthritis, unspecified knee: Secondary | ICD-10-CM | POA: Diagnosis not present

## 2020-08-20 DIAGNOSIS — E1129 Type 2 diabetes mellitus with other diabetic kidney complication: Secondary | ICD-10-CM | POA: Diagnosis not present

## 2020-08-20 DIAGNOSIS — R809 Proteinuria, unspecified: Secondary | ICD-10-CM | POA: Diagnosis not present

## 2020-08-20 DIAGNOSIS — E559 Vitamin D deficiency, unspecified: Secondary | ICD-10-CM | POA: Diagnosis not present

## 2020-08-20 DIAGNOSIS — F17210 Nicotine dependence, cigarettes, uncomplicated: Secondary | ICD-10-CM | POA: Diagnosis not present

## 2020-08-20 DIAGNOSIS — I10 Essential (primary) hypertension: Secondary | ICD-10-CM | POA: Diagnosis not present

## 2020-08-20 DIAGNOSIS — Z1231 Encounter for screening mammogram for malignant neoplasm of breast: Secondary | ICD-10-CM

## 2020-08-20 DIAGNOSIS — Z7982 Long term (current) use of aspirin: Secondary | ICD-10-CM | POA: Diagnosis not present

## 2020-08-20 DIAGNOSIS — M5136 Other intervertebral disc degeneration, lumbar region: Secondary | ICD-10-CM | POA: Diagnosis not present

## 2020-08-20 DIAGNOSIS — M4800 Spinal stenosis, site unspecified: Secondary | ICD-10-CM | POA: Diagnosis not present

## 2020-08-20 DIAGNOSIS — G43909 Migraine, unspecified, not intractable, without status migrainosus: Secondary | ICD-10-CM | POA: Diagnosis not present

## 2020-08-20 DIAGNOSIS — E871 Hypo-osmolality and hyponatremia: Secondary | ICD-10-CM | POA: Diagnosis not present

## 2020-08-20 DIAGNOSIS — G4700 Insomnia, unspecified: Secondary | ICD-10-CM | POA: Diagnosis not present

## 2020-08-20 DIAGNOSIS — Z7984 Long term (current) use of oral hypoglycemic drugs: Secondary | ICD-10-CM | POA: Diagnosis not present

## 2020-08-20 DIAGNOSIS — E041 Nontoxic single thyroid nodule: Secondary | ICD-10-CM | POA: Diagnosis not present

## 2020-08-20 DIAGNOSIS — I700 Atherosclerosis of aorta: Secondary | ICD-10-CM | POA: Diagnosis not present

## 2020-08-20 DIAGNOSIS — J3489 Other specified disorders of nose and nasal sinuses: Secondary | ICD-10-CM | POA: Diagnosis not present

## 2020-08-20 DIAGNOSIS — Z9181 History of falling: Secondary | ICD-10-CM | POA: Diagnosis not present

## 2020-08-20 DIAGNOSIS — M47819 Spondylosis without myelopathy or radiculopathy, site unspecified: Secondary | ICD-10-CM | POA: Diagnosis not present

## 2020-08-20 DIAGNOSIS — J449 Chronic obstructive pulmonary disease, unspecified: Secondary | ICD-10-CM | POA: Diagnosis not present

## 2020-08-20 DIAGNOSIS — E782 Mixed hyperlipidemia: Secondary | ICD-10-CM | POA: Diagnosis not present

## 2020-08-21 DIAGNOSIS — M4800 Spinal stenosis, site unspecified: Secondary | ICD-10-CM | POA: Diagnosis not present

## 2020-08-21 DIAGNOSIS — E782 Mixed hyperlipidemia: Secondary | ICD-10-CM | POA: Diagnosis not present

## 2020-08-21 DIAGNOSIS — I10 Essential (primary) hypertension: Secondary | ICD-10-CM | POA: Diagnosis not present

## 2020-08-21 DIAGNOSIS — K219 Gastro-esophageal reflux disease without esophagitis: Secondary | ICD-10-CM | POA: Diagnosis not present

## 2020-08-21 DIAGNOSIS — E041 Nontoxic single thyroid nodule: Secondary | ICD-10-CM | POA: Diagnosis not present

## 2020-08-21 DIAGNOSIS — E1140 Type 2 diabetes mellitus with diabetic neuropathy, unspecified: Secondary | ICD-10-CM | POA: Diagnosis not present

## 2020-08-21 DIAGNOSIS — J3489 Other specified disorders of nose and nasal sinuses: Secondary | ICD-10-CM | POA: Diagnosis not present

## 2020-08-21 DIAGNOSIS — Z9181 History of falling: Secondary | ICD-10-CM | POA: Diagnosis not present

## 2020-08-21 DIAGNOSIS — Z7982 Long term (current) use of aspirin: Secondary | ICD-10-CM | POA: Diagnosis not present

## 2020-08-21 DIAGNOSIS — J449 Chronic obstructive pulmonary disease, unspecified: Secondary | ICD-10-CM | POA: Diagnosis not present

## 2020-08-21 DIAGNOSIS — G43909 Migraine, unspecified, not intractable, without status migrainosus: Secondary | ICD-10-CM | POA: Diagnosis not present

## 2020-08-21 DIAGNOSIS — E559 Vitamin D deficiency, unspecified: Secondary | ICD-10-CM | POA: Diagnosis not present

## 2020-08-21 DIAGNOSIS — F17210 Nicotine dependence, cigarettes, uncomplicated: Secondary | ICD-10-CM | POA: Diagnosis not present

## 2020-08-21 DIAGNOSIS — M5136 Other intervertebral disc degeneration, lumbar region: Secondary | ICD-10-CM | POA: Diagnosis not present

## 2020-08-21 DIAGNOSIS — R809 Proteinuria, unspecified: Secondary | ICD-10-CM | POA: Diagnosis not present

## 2020-08-21 DIAGNOSIS — E871 Hypo-osmolality and hyponatremia: Secondary | ICD-10-CM | POA: Diagnosis not present

## 2020-08-21 DIAGNOSIS — E1129 Type 2 diabetes mellitus with other diabetic kidney complication: Secondary | ICD-10-CM | POA: Diagnosis not present

## 2020-08-21 DIAGNOSIS — Z7984 Long term (current) use of oral hypoglycemic drugs: Secondary | ICD-10-CM | POA: Diagnosis not present

## 2020-08-21 DIAGNOSIS — G4700 Insomnia, unspecified: Secondary | ICD-10-CM | POA: Diagnosis not present

## 2020-08-21 DIAGNOSIS — I700 Atherosclerosis of aorta: Secondary | ICD-10-CM | POA: Diagnosis not present

## 2020-08-21 DIAGNOSIS — M47819 Spondylosis without myelopathy or radiculopathy, site unspecified: Secondary | ICD-10-CM | POA: Diagnosis not present

## 2020-08-21 DIAGNOSIS — M1710 Unilateral primary osteoarthritis, unspecified knee: Secondary | ICD-10-CM | POA: Diagnosis not present

## 2020-08-25 ENCOUNTER — Ambulatory Visit
Admission: RE | Admit: 2020-08-25 | Discharge: 2020-08-25 | Disposition: A | Discharge status: Home / Self Care | Payer: Medicare Other | Source: Ambulatory Visit | Attending: Family Medicine | Admitting: Family Medicine

## 2020-08-25 ENCOUNTER — Other Ambulatory Visit: Payer: Self-pay

## 2020-08-25 DIAGNOSIS — M47819 Spondylosis without myelopathy or radiculopathy, site unspecified: Secondary | ICD-10-CM | POA: Diagnosis not present

## 2020-08-25 DIAGNOSIS — M1710 Unilateral primary osteoarthritis, unspecified knee: Secondary | ICD-10-CM | POA: Diagnosis not present

## 2020-08-25 DIAGNOSIS — E041 Nontoxic single thyroid nodule: Secondary | ICD-10-CM | POA: Diagnosis not present

## 2020-08-25 DIAGNOSIS — I700 Atherosclerosis of aorta: Secondary | ICD-10-CM | POA: Diagnosis not present

## 2020-08-25 DIAGNOSIS — E1140 Type 2 diabetes mellitus with diabetic neuropathy, unspecified: Secondary | ICD-10-CM | POA: Diagnosis not present

## 2020-08-25 DIAGNOSIS — K219 Gastro-esophageal reflux disease without esophagitis: Secondary | ICD-10-CM | POA: Diagnosis not present

## 2020-08-25 DIAGNOSIS — E559 Vitamin D deficiency, unspecified: Secondary | ICD-10-CM | POA: Diagnosis not present

## 2020-08-25 DIAGNOSIS — I10 Essential (primary) hypertension: Secondary | ICD-10-CM | POA: Diagnosis not present

## 2020-08-25 DIAGNOSIS — F17210 Nicotine dependence, cigarettes, uncomplicated: Secondary | ICD-10-CM | POA: Diagnosis not present

## 2020-08-25 DIAGNOSIS — Z7982 Long term (current) use of aspirin: Secondary | ICD-10-CM | POA: Diagnosis not present

## 2020-08-25 DIAGNOSIS — M5136 Other intervertebral disc degeneration, lumbar region: Secondary | ICD-10-CM | POA: Diagnosis not present

## 2020-08-25 DIAGNOSIS — J3489 Other specified disorders of nose and nasal sinuses: Secondary | ICD-10-CM | POA: Diagnosis not present

## 2020-08-25 DIAGNOSIS — J449 Chronic obstructive pulmonary disease, unspecified: Secondary | ICD-10-CM | POA: Diagnosis not present

## 2020-08-25 DIAGNOSIS — E871 Hypo-osmolality and hyponatremia: Secondary | ICD-10-CM | POA: Diagnosis not present

## 2020-08-25 DIAGNOSIS — G4700 Insomnia, unspecified: Secondary | ICD-10-CM | POA: Diagnosis not present

## 2020-08-25 DIAGNOSIS — M4800 Spinal stenosis, site unspecified: Secondary | ICD-10-CM | POA: Diagnosis not present

## 2020-08-25 DIAGNOSIS — Z1231 Encounter for screening mammogram for malignant neoplasm of breast: Secondary | ICD-10-CM

## 2020-08-25 DIAGNOSIS — E1129 Type 2 diabetes mellitus with other diabetic kidney complication: Secondary | ICD-10-CM | POA: Diagnosis not present

## 2020-08-25 DIAGNOSIS — E782 Mixed hyperlipidemia: Secondary | ICD-10-CM | POA: Diagnosis not present

## 2020-08-25 DIAGNOSIS — Z9181 History of falling: Secondary | ICD-10-CM | POA: Diagnosis not present

## 2020-08-25 DIAGNOSIS — Z7984 Long term (current) use of oral hypoglycemic drugs: Secondary | ICD-10-CM | POA: Diagnosis not present

## 2020-08-25 DIAGNOSIS — G43909 Migraine, unspecified, not intractable, without status migrainosus: Secondary | ICD-10-CM | POA: Diagnosis not present

## 2020-08-25 DIAGNOSIS — R809 Proteinuria, unspecified: Secondary | ICD-10-CM | POA: Diagnosis not present

## 2020-08-27 DIAGNOSIS — I700 Atherosclerosis of aorta: Secondary | ICD-10-CM | POA: Diagnosis not present

## 2020-08-27 DIAGNOSIS — R809 Proteinuria, unspecified: Secondary | ICD-10-CM | POA: Diagnosis not present

## 2020-08-27 DIAGNOSIS — M5136 Other intervertebral disc degeneration, lumbar region: Secondary | ICD-10-CM | POA: Diagnosis not present

## 2020-08-27 DIAGNOSIS — E1129 Type 2 diabetes mellitus with other diabetic kidney complication: Secondary | ICD-10-CM | POA: Diagnosis not present

## 2020-08-27 DIAGNOSIS — G4700 Insomnia, unspecified: Secondary | ICD-10-CM | POA: Diagnosis not present

## 2020-08-27 DIAGNOSIS — E1140 Type 2 diabetes mellitus with diabetic neuropathy, unspecified: Secondary | ICD-10-CM | POA: Diagnosis not present

## 2020-08-27 DIAGNOSIS — Z9181 History of falling: Secondary | ICD-10-CM | POA: Diagnosis not present

## 2020-08-27 DIAGNOSIS — F17210 Nicotine dependence, cigarettes, uncomplicated: Secondary | ICD-10-CM | POA: Diagnosis not present

## 2020-08-27 DIAGNOSIS — M1710 Unilateral primary osteoarthritis, unspecified knee: Secondary | ICD-10-CM | POA: Diagnosis not present

## 2020-08-27 DIAGNOSIS — E041 Nontoxic single thyroid nodule: Secondary | ICD-10-CM | POA: Diagnosis not present

## 2020-08-27 DIAGNOSIS — Z7984 Long term (current) use of oral hypoglycemic drugs: Secondary | ICD-10-CM | POA: Diagnosis not present

## 2020-08-27 DIAGNOSIS — Z7982 Long term (current) use of aspirin: Secondary | ICD-10-CM | POA: Diagnosis not present

## 2020-08-27 DIAGNOSIS — I10 Essential (primary) hypertension: Secondary | ICD-10-CM | POA: Diagnosis not present

## 2020-08-27 DIAGNOSIS — E871 Hypo-osmolality and hyponatremia: Secondary | ICD-10-CM | POA: Diagnosis not present

## 2020-08-27 DIAGNOSIS — J3489 Other specified disorders of nose and nasal sinuses: Secondary | ICD-10-CM | POA: Diagnosis not present

## 2020-08-27 DIAGNOSIS — G43909 Migraine, unspecified, not intractable, without status migrainosus: Secondary | ICD-10-CM | POA: Diagnosis not present

## 2020-08-27 DIAGNOSIS — E782 Mixed hyperlipidemia: Secondary | ICD-10-CM | POA: Diagnosis not present

## 2020-08-27 DIAGNOSIS — M4800 Spinal stenosis, site unspecified: Secondary | ICD-10-CM | POA: Diagnosis not present

## 2020-08-27 DIAGNOSIS — K219 Gastro-esophageal reflux disease without esophagitis: Secondary | ICD-10-CM | POA: Diagnosis not present

## 2020-08-27 DIAGNOSIS — J449 Chronic obstructive pulmonary disease, unspecified: Secondary | ICD-10-CM | POA: Diagnosis not present

## 2020-08-27 DIAGNOSIS — M47819 Spondylosis without myelopathy or radiculopathy, site unspecified: Secondary | ICD-10-CM | POA: Diagnosis not present

## 2020-08-27 DIAGNOSIS — E559 Vitamin D deficiency, unspecified: Secondary | ICD-10-CM | POA: Diagnosis not present

## 2020-09-07 DIAGNOSIS — I10 Essential (primary) hypertension: Secondary | ICD-10-CM | POA: Diagnosis not present

## 2020-09-07 DIAGNOSIS — J449 Chronic obstructive pulmonary disease, unspecified: Secondary | ICD-10-CM | POA: Diagnosis not present

## 2020-09-07 DIAGNOSIS — E1129 Type 2 diabetes mellitus with other diabetic kidney complication: Secondary | ICD-10-CM | POA: Diagnosis not present

## 2020-09-07 DIAGNOSIS — R809 Proteinuria, unspecified: Secondary | ICD-10-CM | POA: Diagnosis not present

## 2020-09-30 DIAGNOSIS — G4700 Insomnia, unspecified: Secondary | ICD-10-CM | POA: Diagnosis not present

## 2020-09-30 DIAGNOSIS — F17210 Nicotine dependence, cigarettes, uncomplicated: Secondary | ICD-10-CM | POA: Diagnosis not present

## 2020-09-30 DIAGNOSIS — F17200 Nicotine dependence, unspecified, uncomplicated: Secondary | ICD-10-CM | POA: Diagnosis not present

## 2020-10-09 ENCOUNTER — Other Ambulatory Visit: Payer: Self-pay

## 2020-10-09 ENCOUNTER — Ambulatory Visit (INDEPENDENT_AMBULATORY_CARE_PROVIDER_SITE_OTHER): Payer: Medicare Other | Admitting: Nurse Practitioner

## 2020-10-09 VITALS — BP 157/81 | HR 73 | Ht 62.0 in | Wt 177.0 lb

## 2020-10-09 DIAGNOSIS — F17200 Nicotine dependence, unspecified, uncomplicated: Secondary | ICD-10-CM

## 2020-10-09 DIAGNOSIS — I739 Peripheral vascular disease, unspecified: Secondary | ICD-10-CM

## 2020-10-09 DIAGNOSIS — R600 Localized edema: Secondary | ICD-10-CM | POA: Diagnosis not present

## 2020-10-09 DIAGNOSIS — E782 Mixed hyperlipidemia: Secondary | ICD-10-CM

## 2020-10-09 DIAGNOSIS — I10 Essential (primary) hypertension: Secondary | ICD-10-CM | POA: Diagnosis not present

## 2020-10-09 DIAGNOSIS — M7989 Other specified soft tissue disorders: Secondary | ICD-10-CM

## 2020-10-12 ENCOUNTER — Encounter (INDEPENDENT_AMBULATORY_CARE_PROVIDER_SITE_OTHER): Payer: Self-pay | Admitting: Nurse Practitioner

## 2020-10-12 NOTE — Progress Notes (Signed)
Subjective:    Patient ID: Alexandra Richardson, female    DOB: 09-Apr-1950, 70 y.o.   MRN: [id number] Chief Complaint  Patient presents with  . New Patient (Initial Visit)    NP consult BIL LE pain referred by [facility]    Alexandra Richardson is a 70 year old female that presents today for evaluation regarding lower extremity pain.  The patient also notes that she has issues with swelling as well.  The patient notes that her legs hurt all the time and she has cramping when she walks that is consistent with claudication.  This pain has been going on for over 1 year.  She also has burning and tingling in her legs as well.  She does note significant lower back issues.  She also has a history of diabetes, high blood pressure, hyperlipidemia as well as being a smoker.  She notes a family history of coronary artery disease as well as peripheral arterial disease.  She denies any wounds or ulcerations.  Review of Systems  Cardiovascular:        Claudication  Musculoskeletal:  Positive for back pain.  Neurological:  Positive for numbness.  All other systems reviewed and are negative.     Objective:   Physical Exam Vitals reviewed.  HENT:     Head: Normocephalic.  Cardiovascular:     Rate and Rhythm: Normal rate.     Comments: Nonpalpable pedal pulses Pulmonary:     Effort: Pulmonary effort is normal.  Neurological:     Mental Status: She is alert and oriented to person, place, and time.  Psychiatric:        Mood and Affect: Mood normal.        Behavior: Behavior normal.        Thought Content: Thought content normal.        Judgment: Judgment normal.    BP (!) 157/81   Pulse 73   Ht 5\' 2"  (1.575 m)   Wt 177 lb (80.3 kg)   BMI 32.37 kg/m   Past Medical History:  Diagnosis Date  . Anxiety   . Anxiety   . Arthritis   . Depression   . Grief 07/03/2011  . Hyperlipidemia   . Hypertension     Social History   Socioeconomic History  . Marital status: Legally Separated     Spouse name: Not on file  . Number of children: Not on file  . Years of education: Not on file  . Highest education level: Not on file  Occupational History  . Occupation: retired  Tobacco Use  . Smoking status: Every Day    Packs/day: 1.00    Years: 40.00    Pack years: 40.00    Types: Cigarettes  . Smokeless tobacco: Never  Vaping Use  . Vaping Use: Never used  Substance and Sexual Activity  . Alcohol use: Yes    Comment: occasionally  . Drug use: No  . Sexual activity: Not on file  Other Topics Concern  . Not on file  Social History Narrative  . Not on file   Social Determinants of Health   Financial Resource Strain: Not on file  Food Insecurity: Not on file  Transportation Needs: Not on file  Physical Activity: Not on file  Stress: Not on file  Social Connections: Not on file  Intimate Partner Violence: Not on file    Past Surgical History:  Procedure Laterality Date  . CHOLECYSTECTOMY      Family History  Problem  Relation Age of Onset  . Coronary artery disease Father   . Diabetes type II Sister   . Hypertension Mother   . Breast cancer Neg Hx     Allergies  Allergen Reactions  . Bee Venom Swelling  . Codeine Nausea And Vomiting    CBC Latest Ref Rng & Units 06/01/2020 05/31/2020 05/30/2020  WBC 4.0 - 10.5 K/uL 8.3 8.1 8.6  Hemoglobin 12.0 - 15.0 g/dL 11.4(L) 11.9(L) 12.6  Hematocrit 36.0 - 46.0 % 33.6(L) 34.7(L) 35.4(L)  Platelets 150 - 400 K/uL 230 241 264      CMP     Component Value Date/Time   NA 130 (L) 06/02/2020 0744   K 3.8 06/02/2020 0744   CL 92 (L) 06/02/2020 0744   CO2 27 06/02/2020 0744   GLUCOSE 145 (H) 06/02/2020 0744   BUN 6 (L) 06/02/2020 0744   CREATININE 0.62 06/02/2020 0744   CALCIUM 9.8 06/02/2020 0744   PROT 6.3 (L) 06/01/2020 0256   ALBUMIN 3.1 (L) 06/01/2020 0256   AST 18 06/01/2020 0256   ALT 14 06/01/2020 0256   ALKPHOS 16 (L) 06/01/2020 0256   BILITOT 0.6 06/01/2020 0256   GFRNONAA >60 06/02/2020 0744    GFRAA >60 07/19/2018 1105     No results found.     Assessment & Plan:   1. Claudication [facility]) The patient does have concerning symptoms for possible peripheral arterial disease.  She also has several risk factors including diabetes, high blood pressure and smoking.  There is also a significant family history of PAD as well as coronary artery disease.  It is also possible however that the patient's significant back pain may also be a cause of her pain.  We will have the patient return at her convenience for noninvasive studies to evaluate for possible peripheral arterial disease.  2. Tobacco dependence Smoking cessation was discussed, 3-10 minutes spent on this topic specifically   3. Primary hypertension Continue antihypertensive medications as already ordered, these medications have been reviewed and there are no changes at this time.   4. Mixed hyperlipidemia Continue statin as ordered and reviewed, no changes at this time   5. Leg swelling I have had a long discussion with the patient regarding swelling and why it  causes symptoms.  Patient will begin wearing graduated compression stockings class 1 (20-30 mmHg) on a daily basis a prescription was given. The patient will  beginning wearing the stockings first thing in the morning and removing them in the evening. The patient is instructed specifically not to sleep in the stockings.   In addition, behavioral modification will be initiated.  This will include frequent elevation, use of over the counter pain medications and exercise such as walking.  I have reviewed systemic causes for chronic edema such as liver, kidney and cardiac etiologies.  The patient denies problems with these organ systems.    Consideration for a lymph pump will also be made based upon the effectiveness of conservative therapy.  This would help to improve the edema control and prevent sequela such as ulcers and infections   Patient should undergo duplex  ultrasound of the venous system to ensure that DVT or reflux is not present.  The patient will follow-up with me after the ultrasound.     Current Outpatient Medications on File Prior to Visit  Medication Sig Dispense Refill  . albuterol (VENTOLIN HFA) 108 (90 Base) MCG/ACT inhaler Inhale 2 puffs into the lungs every 4 (four) hours as needed.    Marland Kitchen  aspirin EC 81 MG tablet Take 81 mg by mouth daily.    Marland Kitchen atorvastatin (LIPITOR) 80 MG tablet Take 80 mg by mouth daily.     . busPIRone (BUSPAR) 15 MG tablet Take 5 mg by mouth 2 (two) times daily.     . celecoxib (CELEBREX) 200 MG capsule Take by mouth 2 (two) times daily.    . clonazePAM (KLONOPIN) 0.5 MG tablet Take 0.5 mg by mouth daily as needed.    . cyclobenzaprine (FLEXERIL) 5 MG tablet Take 5 mg by mouth at bedtime.    . famotidine (PEPCID) 40 MG tablet Take 40 mg by mouth daily.    . fluticasone (FLONASE) 50 MCG/ACT nasal spray Place 2 sprays into both nostrils daily. 16 g 2  . gabapentin (NEURONTIN) 600 MG tablet Take 1 tablet (600 mg total) by mouth at bedtime. (Patient taking differently: Take 600 mg by mouth 3 (three) times daily.) 30 tablet 0  . metFORMIN (GLUCOPHAGE) 850 MG tablet Take 850 mg by mouth 2 (two) times daily.    . metoprolol succinate (TOPROL-XL) 50 MG 24 hr tablet Take 75 mg by mouth daily.    . mirtazapine (REMERON) 15 MG tablet Take 15 mg by mouth at bedtime.    . Multiple Vitamins-Minerals (CENTRUM SILVER PO) Take 1 capsule by mouth daily.    Marland Kitchen omeprazole (PRILOSEC) 20 MG capsule Take 20 mg by mouth daily.    . pregabalin (LYRICA) 150 MG capsule Take 150 mg by mouth daily.    . ramipril (ALTACE) 10 MG capsule Take 10 mg by mouth daily.     . risperiDONE (RISPERDAL) 2 MG tablet Take 2 mg by mouth 2 (two) times daily.    . sertraline (ZOLOFT) 25 MG tablet Take 25 mg by mouth daily.    . SUMAtriptan (IMITREX) 100 MG tablet Take 100 mg by mouth every 12 (twelve) hours as needed.    . tiotropium (SPIRIVA) 18 MCG  inhalation capsule Place 1 capsule (18 mcg total) into inhaler and inhale daily. 30 capsule 12   No current facility-administered medications on file prior to visit.    There are no Patient Instructions on file for this visit. No follow-ups on file.   Georgiana Spinner, NP

## 2020-10-20 ENCOUNTER — Other Ambulatory Visit (INDEPENDENT_AMBULATORY_CARE_PROVIDER_SITE_OTHER): Payer: Self-pay | Admitting: Nurse Practitioner

## 2020-10-20 DIAGNOSIS — I739 Peripheral vascular disease, unspecified: Secondary | ICD-10-CM

## 2020-10-20 DIAGNOSIS — M7989 Other specified soft tissue disorders: Secondary | ICD-10-CM

## 2020-10-23 ENCOUNTER — Encounter (INDEPENDENT_AMBULATORY_CARE_PROVIDER_SITE_OTHER): Payer: Medicare Other

## 2020-10-23 ENCOUNTER — Ambulatory Visit (INDEPENDENT_AMBULATORY_CARE_PROVIDER_SITE_OTHER): Payer: Medicare Other | Admitting: Nurse Practitioner

## 2020-10-27 DIAGNOSIS — E1129 Type 2 diabetes mellitus with other diabetic kidney complication: Secondary | ICD-10-CM | POA: Diagnosis not present

## 2020-10-27 DIAGNOSIS — K219 Gastro-esophageal reflux disease without esophagitis: Secondary | ICD-10-CM | POA: Diagnosis not present

## 2020-10-27 DIAGNOSIS — J449 Chronic obstructive pulmonary disease, unspecified: Secondary | ICD-10-CM | POA: Diagnosis not present

## 2020-10-27 DIAGNOSIS — Z23 Encounter for immunization: Secondary | ICD-10-CM | POA: Diagnosis not present

## 2020-10-27 DIAGNOSIS — E782 Mixed hyperlipidemia: Secondary | ICD-10-CM | POA: Diagnosis not present

## 2020-10-27 DIAGNOSIS — R809 Proteinuria, unspecified: Secondary | ICD-10-CM | POA: Diagnosis not present

## 2020-10-27 DIAGNOSIS — Z87891 Personal history of nicotine dependence: Secondary | ICD-10-CM | POA: Diagnosis not present

## 2020-10-27 DIAGNOSIS — I10 Essential (primary) hypertension: Secondary | ICD-10-CM | POA: Diagnosis not present

## 2020-10-29 DIAGNOSIS — F17210 Nicotine dependence, cigarettes, uncomplicated: Secondary | ICD-10-CM | POA: Diagnosis not present

## 2020-10-29 DIAGNOSIS — Z720 Tobacco use: Secondary | ICD-10-CM | POA: Diagnosis not present

## 2020-10-29 DIAGNOSIS — G4700 Insomnia, unspecified: Secondary | ICD-10-CM | POA: Diagnosis not present

## 2020-11-11 ENCOUNTER — Ambulatory Visit (INDEPENDENT_AMBULATORY_CARE_PROVIDER_SITE_OTHER): Payer: Medicare Other | Admitting: Nurse Practitioner

## 2020-11-11 ENCOUNTER — Other Ambulatory Visit: Payer: Self-pay

## 2020-11-11 ENCOUNTER — Telehealth (INDEPENDENT_AMBULATORY_CARE_PROVIDER_SITE_OTHER): Payer: Self-pay | Admitting: Nurse Practitioner

## 2020-11-11 ENCOUNTER — Ambulatory Visit (INDEPENDENT_AMBULATORY_CARE_PROVIDER_SITE_OTHER): Payer: Medicare Other

## 2020-11-11 VITALS — BP 165/77 | HR 88 | Ht 62.0 in | Wt 177.0 lb

## 2020-11-11 DIAGNOSIS — M79604 Pain in right leg: Secondary | ICD-10-CM

## 2020-11-11 DIAGNOSIS — M7989 Other specified soft tissue disorders: Secondary | ICD-10-CM

## 2020-11-11 DIAGNOSIS — I739 Peripheral vascular disease, unspecified: Secondary | ICD-10-CM

## 2020-11-11 MED ORDER — TRAMADOL HCL 50 MG PO TABS: 50.0000 mg | ORAL_TABLET | Freq: Four times a day (QID) | ORAL | 0 refills | Status: AC | PRN

## 2020-11-11 NOTE — Telephone Encounter (Signed)
Pharmacy is calling in stating that there is a drug interaction with the medication that was called in today for pt 11/11/2020 and clonazePAM (KLONOPIN) 0.5 MG tablet  Judeth Cornfield would like a call back at [phone]

## 2020-11-21 ENCOUNTER — Encounter (INDEPENDENT_AMBULATORY_CARE_PROVIDER_SITE_OTHER): Payer: Self-pay | Admitting: Nurse Practitioner

## 2020-11-21 NOTE — Progress Notes (Signed)
Subjective:    Patient ID: Alexandra Richardson, female    DOB: 1951/01/23, 70 y.o.   MRN: [id number] Chief Complaint  Patient presents with   Follow-up    Pt conv  FU U/S     Alexandra Richardson is a 70 year old female that presents today for evaluation regarding lower extremity pain.  The patient also notes that she has issues with swelling as well.  The patient notes that her legs hurt all the time and she has cramping when she walks that is consistent with claudication.  This pain has been going on for over 1 year.  She also has burning and tingling in her legs as well.  She does note significant lower back issues.  She also has a history of diabetes, high blood pressure, hyperlipidemia as well as being a smoker.  She notes a family history of coronary artery disease as well as peripheral arterial disease.  She denies any wounds or ulcerations.  Today noninvasive studies show an ABI of 1.36 on the right and 1.07 on the left.  She has a TBI of 1.05 on the right and 1.07 on the left.  The right lower extremity has biphasic/triphasic waveforms with triphasic waveforms in the left lower extremity.  She has good toe waveforms bilaterally.  No evidence of DVT or superficial thrombophlebitis seen bilaterally.  No evidence of deep venous insufficiency seen bilaterally.  No evidence of superficial venous reflux seen bilaterally   Review of Systems  Cardiovascular:        Claudication  Musculoskeletal:  Positive for back pain.  Neurological:  Positive for numbness.  All other systems reviewed and are negative.     Objective:   Physical Exam Vitals reviewed.  HENT:     Head: Normocephalic.  Cardiovascular:     Rate and Rhythm: Normal rate.     Comments: Nonpalpable pedal pulses Pulmonary:     Effort: Pulmonary effort is normal.  Skin:    General: Skin is warm and dry.  Neurological:     Mental Status: She is alert and oriented to person, place, and time.  Psychiatric:        Mood and  Affect: Mood normal.        Behavior: Behavior normal.        Thought Content: Thought content normal.        Judgment: Judgment normal.    BP (!) 165/77   Pulse 88   Ht 5\' 2"  (1.575 m)   Wt 177 lb (80.3 kg)   BMI 32.37 kg/m   Past Medical History:  Diagnosis Date   Anxiety    Anxiety    Arthritis    Depression    Grief 07/03/2011   Hyperlipidemia    Hypertension     Social History   Socioeconomic History   Marital status: Legally Separated    Spouse name: Not on file   Number of children: Not on file   Years of education: Not on file   Highest education level: Not on file  Occupational History   Occupation: retired  Tobacco Use   Smoking status: Every Day    Packs/day: 1.00    Years: 40.00    Pack years: 40.00    Types: Cigarettes   Smokeless tobacco: Never  Vaping Use   Vaping Use: Never used  Substance and Sexual Activity   Alcohol use: Yes    Comment: occasionally   Drug use: No   Sexual activity: Not on file  Other Topics Concern   Not on file  Social History Narrative   Not on file   Social Determinants of Health   Financial Resource Strain: Not on file  Food Insecurity: Not on file  Transportation Needs: Not on file  Physical Activity: Not on file  Stress: Not on file  Social Connections: Not on file  Intimate Partner Violence: Not on file    Past Surgical History:  Procedure Laterality Date   CHOLECYSTECTOMY      Family History  Problem Relation Age of Onset   Coronary artery disease Father    Diabetes type II Sister    Hypertension Mother    Breast cancer Neg Hx     Allergies  Allergen Reactions   Bee Venom Swelling   Codeine Nausea And Vomiting    CBC Latest Ref Rng & Units 06/01/2020 05/31/2020 05/30/2020  WBC 4.0 - 10.5 K/uL 8.3 8.1 8.6  Hemoglobin 12.0 - 15.0 g/dL 11.4(L) 11.9(L) 12.6  Hematocrit 36.0 - 46.0 % 33.6(L) 34.7(L) 35.4(L)  Platelets 150 - 400 K/uL 230 241 264      CMP     Component Value Date/Time   NA  130 (L) 06/02/2020 0744   K 3.8 06/02/2020 0744   CL 92 (L) 06/02/2020 0744   CO2 27 06/02/2020 0744   GLUCOSE 145 (H) 06/02/2020 0744   BUN 6 (L) 06/02/2020 0744   CREATININE 0.62 06/02/2020 0744   CALCIUM 9.8 06/02/2020 0744   PROT 6.3 (L) 06/01/2020 0256   ALBUMIN 3.1 (L) 06/01/2020 0256   AST 18 06/01/2020 0256   ALT 14 06/01/2020 0256   ALKPHOS 16 (L) 06/01/2020 0256   BILITOT 0.6 06/01/2020 0256   GFRNONAA >60 06/02/2020 0744   GFRAA >60 07/19/2018 1105     VAS [place] ABI WITH/WO TBI  Result Date: 11/11/2020  LOWER EXTREMITY DOPPLER STUDY Patient Name:  Alexandra Richardson  Date of Exam:   11/11/2020 Medical Rec #: [id number]          Accession #:    [id number] Date of Birth: 1950-09-19          Patient Gender: F Patient Age:   31 years Exam Location:  [facility] Procedure:      VAS [place] ABI WITH/WO TBI Referring Phys: Sheppard Plumber --------------------------------------------------------------------------------  Indications: Rest pain.  Performing Technologist: Debbe Bales RVS  Examination Guidelines: A complete evaluation includes at minimum, Doppler waveform signals and systolic blood pressure reading at the level of bilateral brachial, anterior tibial, and posterior tibial arteries, when vessel segments are accessible. Bilateral testing is considered an integral part of a complete examination. Photoelectric Plethysmograph (PPG) waveforms and toe systolic pressure readings are included as required and additional duplex testing as needed. Limited examinations for reoccurring indications may be performed as noted.  ABI Findings: +---------+------------------+-----+---------+--------+ Right    Rt Pressure (mmHg)IndexWaveform Comment  +---------+------------------+-----+---------+--------+ Brachial 164                                      +---------+------------------+-----+---------+--------+ ATA      211                    biphasic 1.25      +---------+------------------+-----+---------+--------+ PTA      230               1.36 triphasic         +---------+------------------+-----+---------+--------+  Great Toe178               1.05 Normal            +---------+------------------+-----+---------+--------+ +---------+------------------+-----+---------+-------+ Left     Lt Pressure (mmHg)IndexWaveform Comment +---------+------------------+-----+---------+-------+ Brachial 169                                     +---------+------------------+-----+---------+-------+ ATA      209                    triphasic1.24    +---------+------------------+-----+---------+-------+ PTA      214               1.27 triphasic        +---------+------------------+-----+---------+-------+ Great Toe181               1.07 Normal           +---------+------------------+-----+---------+-------+ +-------+-----------+-----------+------------+------------+ ABI/TBIToday's ABIToday's TBIPrevious ABIPrevious TBI +-------+-----------+-----------+------------+------------+ Right  1.36       1.05                                +-------+-----------+-----------+------------+------------+ Left   1.27       1.07                                +-------+-----------+-----------+------------+------------+  Summary: Right: Resting right ankle-brachial index is within normal range. No evidence of significant right lower extremity arterial disease. The right toe-brachial index is normal. Left: Resting left ankle-brachial index is within normal range. No evidence of significant left lower extremity arterial disease. The left toe-brachial index is normal.  *See table(s) above for measurements and observations.     Preliminary        Assessment & Plan:   1. Claudication [facility]) Recommend:  I do not find evidence of Vascular pathology that would explain the patient's symptoms  The patient has atypical pain symptoms for vascular  disease  I do not find evidence of Vascular pathology that would explain the patient's symptoms and I suspect the patient is c/o pseudoclaudication.  Patient should have an evaluation of his LS spine which I defer to the primary service.  Per the patient's request we will also refer to neurosurgery  Noninvasive studies including venous ultrasound of the legs do not identify vascular problems  The patient should continue walking and begin a more formal exercise program. The patient should continue his antiplatelet therapy and aggressive treatment of the lipid abnormalities. The patient should begin wearing graduated compression socks 15-20 mmHg strength to control her mild edema.  Patient will follow-up with me on a PRN basis      - Ambulatory referral to Neurosurgery   2. PAD (peripheral artery disease) (HCC) Multiple patient does not have decreased ABIs elevated ABIs 's indication arterial hardening.  Given the patient's smoking history we will continue to follow.  We will have the patient repeat studies in 1 year - VAS [place] ABI WITH/WO TBI; Future   Current Outpatient Medications on File Prior to Visit  Medication Sig Dispense Refill   albuterol (VENTOLIN HFA) 108 (90 Base) MCG/ACT inhaler Inhale 2 puffs into the lungs every 4 (four) hours as needed.     aspirin EC 81 MG tablet Take 81 mg by mouth daily.  atorvastatin (LIPITOR) 80 MG tablet Take 80 mg by mouth daily.      busPIRone (BUSPAR) 15 MG tablet Take 5 mg by mouth 2 (two) times daily.      celecoxib (CELEBREX) 200 MG capsule Take by mouth 2 (two) times daily.     clonazePAM (KLONOPIN) 0.5 MG tablet Take 0.5 mg by mouth daily as needed.     cyclobenzaprine (FLEXERIL) 5 MG tablet Take 5 mg by mouth at bedtime.     famotidine (PEPCID) 40 MG tablet Take 40 mg by mouth daily.     fluticasone (FLONASE) 50 MCG/ACT nasal spray Place 2 sprays into both nostrils daily. 16 g 2   gabapentin (NEURONTIN) 600 MG tablet Take 1 tablet  (600 mg total) by mouth at bedtime. (Patient taking differently: Take 600 mg by mouth 3 (three) times daily.) 30 tablet 0   metFORMIN (GLUCOPHAGE) 850 MG tablet Take 850 mg by mouth 2 (two) times daily.     metoprolol succinate (TOPROL-XL) 50 MG 24 hr tablet Take 75 mg by mouth daily.     mirtazapine (REMERON) 15 MG tablet Take 15 mg by mouth at bedtime.     Multiple Vitamins-Minerals (CENTRUM SILVER PO) Take 1 capsule by mouth daily.     omeprazole (PRILOSEC) 20 MG capsule Take 20 mg by mouth daily.     pregabalin (LYRICA) 150 MG capsule Take 150 mg by mouth daily.     ramipril (ALTACE) 10 MG capsule Take 10 mg by mouth daily.      risperiDONE (RISPERDAL) 2 MG tablet Take 2 mg by mouth 2 (two) times daily.     sertraline (ZOLOFT) 25 MG tablet Take 25 mg by mouth daily.     SUMAtriptan (IMITREX) 100 MG tablet Take 100 mg by mouth every 12 (twelve) hours as needed.     SYMBICORT 160-4.5 MCG/ACT inhaler Inhale 2 puffs into the lungs 2 (two) times daily.     tiotropium (SPIRIVA) 18 MCG inhalation capsule Place 1 capsule (18 mcg total) into inhaler and inhale daily. 30 capsule 12   No current facility-administered medications on file prior to visit.    There are no Patient Instructions on file for this visit. Return in about 3 months (around 02/11/2021), or follow up in 3 months for Lymphedema (no studies) and in 1 year for PAD (ABIs), for Lymphedema.   Georgiana Spinner, NP

## 2020-12-24 DIAGNOSIS — Z9181 History of falling: Secondary | ICD-10-CM | POA: Diagnosis not present

## 2020-12-24 DIAGNOSIS — E785 Hyperlipidemia, unspecified: Secondary | ICD-10-CM | POA: Diagnosis not present

## 2020-12-24 DIAGNOSIS — Z0000 Encounter for general adult medical examination without abnormal findings: Secondary | ICD-10-CM | POA: Diagnosis not present

## 2020-12-30 DIAGNOSIS — Z720 Tobacco use: Secondary | ICD-10-CM | POA: Diagnosis not present

## 2020-12-30 DIAGNOSIS — G4700 Insomnia, unspecified: Secondary | ICD-10-CM | POA: Diagnosis not present

## 2020-12-30 DIAGNOSIS — Z79899 Other long term (current) drug therapy: Secondary | ICD-10-CM | POA: Diagnosis not present

## 2020-12-30 DIAGNOSIS — F17210 Nicotine dependence, cigarettes, uncomplicated: Secondary | ICD-10-CM | POA: Diagnosis not present

## 2021-01-19 ENCOUNTER — Other Ambulatory Visit: Payer: Self-pay | Admitting: *Deleted

## 2021-01-19 DIAGNOSIS — Z87891 Personal history of nicotine dependence: Secondary | ICD-10-CM

## 2021-01-19 DIAGNOSIS — F17210 Nicotine dependence, cigarettes, uncomplicated: Secondary | ICD-10-CM

## 2021-02-07 DEATH — deceased

## 2021-02-09 ENCOUNTER — Ambulatory Visit (INDEPENDENT_AMBULATORY_CARE_PROVIDER_SITE_OTHER): Payer: Medicare Other | Admitting: Vascular Surgery

## 2021-02-15 ENCOUNTER — Other Ambulatory Visit: Payer: Self-pay

## 2021-02-15 ENCOUNTER — Encounter: Payer: Medicare Other | Admitting: Primary Care

## 2021-02-15 ENCOUNTER — Telehealth: Payer: Self-pay | Admitting: Primary Care

## 2021-02-15 ENCOUNTER — Encounter: Payer: Medicare Other | Admitting: Acute Care

## 2021-02-15 NOTE — Patient Instructions (Signed)
Thank you for participating in the [facility] Scre[facility] Program. °It was our pleasure to meet you today. °We will call you with the results of your scan within the next few days. °Your scan will be assigned a Lung RADS category score by the physicians reading the scans.  °This Lung RADS score determines follow up scanning.  °See below for description of categories, and follow up screening recommendations. °We will be in touch to schedule your follow up screening annually or based on recommendations of our providers. °We will fax a copy of your scan results to your Primary Care Physician, or the physician who referred you to the program, to ensure they have the results. °Please call the office if you have any questions or concerns regarding your scanning experience or results.  °Our office number is [phone]. °Please speak with Denise Phelps, RN. She is our Lung Cancer Screening RN. °If she is unavailable when you call, please have the office staff send her a message. She will return your call at her earliest convenience. °Remember, if your scan is normal, we will scan you annually as long as you continue to meet the criteria for the program. (Age 55-77, Current smoker or smoker who has quit within the last 15 years). °If you are a smoker, remember, quitting is the single most powerful action that you can take to decrease your risk of lung cancer and other pulmonary, breathing related problems. °We know quitting is hard, and we are here to help.  °Please let us know if there is anything we can do to help you meet your goal of quitting. °If you are a former smoker, congratulations. We are proud of you! Remain smoke free! °Remember you can refer friends or family members through the number above.  °We will screen them to make sure they meet criteria for the program. °Thank you for helping us take better care of you by participating in Lung Screening. ° °You can receive free nicotine replacement therapy  ( patches, gum or mints) by calling 1-800-QUIT NOW. Please call so we can get you on the path to becoming  a non-smoker. I know it is hard, but you can do this! ° °Lung RADS Categories: ° °Lung RADS 1: no nodules or definitely non-concerning nodules.  °Recommendation is for a repeat annual scan in 12 months. ° °Lung RADS 2:  nodules that are non-concerning in appearance and behavior with a very low likelihood of becoming an active cancer. °Recommendation is for a repeat annual scan in 12 months. ° °Lung RADS 3: nodules that are probably non-concerning , includes nodules with a low likelihood of becoming an active cancer.  Recommendation is for a 6-month repeat screening scan. Often noted after an upper respiratory illness. We will be in touch to make sure you have no questions, and to schedule your 6-month scan. ° °Lung RADS 4 A: nodules with concerning findings, recommendation is most often for a follow up scan in 3 months or additional testing based on our provider's assessment of the scan. We will be in touch to make sure you have no questions and to schedule the recommended 3 month follow up scan. ° °Lung RADS 4 B:  indicates findings that are concerning. We will be in touch with you to schedule additional diagnostic testing based on our provider's  assessment of the scan. ° °Hypnosis for smoking cessation  °[employer] °[phone] ° °Acupuncture for smoking cessation  °[facility] °[phone]  °

## 2021-02-15 NOTE — Telephone Encounter (Signed)
Called patient for virtual shared decision making visit. There was no answer/went straight to voice mail and unable to leave message. We will need to reschedule visit and LDCT

## 2021-02-15 NOTE — Progress Notes (Deleted)
Virtual Visit via Telephone Note  I connected with Alexandra Richardson on 02/15/21 at  4:00 PM EST by telephone and verified that I am speaking with the correct person using two identifiers.  Location: Patient: *** Provider: ***   Shared Decision Making Visit Lung Cancer Screening Program [phone])   Eligibility: Age 71 y.o. Pack Years Smoking History Calculation *** (# packs/per year x # years smoked) Recent History of coughing up blood  {YES NO:22349} Unexplained weight loss? {YES NO:22349} ( >Than 15 pounds within the last 6 months ) Prior History Lung / other cancer {YES NO:22349} (Diagnosis within the last 5 years already requiring surveillance chest CT Scans). Smoking Status {Smoking Status:21012044} Former Smokers: Years since quit: {Smoking numbers:21012046}  Quit Date: ***  Visit Components: Discussion included one or more decision making aids. {YES NO:22349} Discussion included risk/benefits of screening. {YES NO:22349} Discussion included potential follow up diagnostic testing for abnormal scans. {YES NO:22349} Discussion included meaning and risk of over diagnosis. {YES NO:22349} Discussion included meaning and risk of False Positives. {YES NO:22349} Discussion included meaning of total radiation exposure. {YES [id number]  Counseling Included: Importance of adherence to annual lung cancer LDCT screening. {YES NO:22349} Impact of comorbidities on ability to participate in the program. {YES NO:22349} Ability and willingness to under diagnostic treatment. {YES NO:22349}  Smoking Cessation Counseling: Current Smokers:  Discussed importance of smoking cessation. {YES [id number] Information about tobacco cessation classes and interventions provided to patient. {YES [id number] Patient provided with "ticket" for LDCT Scan. {YES NO:22349} Symptomatic Patient. {YES NO:22349}  Counseling{Symptomatic Patient:21012041} Diagnosis Code: Tobacco Use Z72.0 Asymptomatic Patient {YES  NO:22349}  Counseling {Asymptomatic patient:21012042} Former Smokers:  Discussed the importance of maintaining cigarette abstinence. {YES NO:22349} Diagnosis Code: Personal History of Nicotine Dependence. S06.301 Information about tobacco cessation classes and interventions provided to patient. {Responses; yes/no/refused:32142} Patient provided with "ticket" for LDCT Scan. {YES [id number] Written Order for Lung Cancer Screening with LDCT placed in Epic. {Smoking cessesion custom:21012043} (CT Chest Lung Cancer Screening Low Dose W/O CM) [id number] Z12.2-Screening of respiratory organs Z87.891-Personal history of nicotine dependence   Glenford Bayley, NP

## 2021-02-16 ENCOUNTER — Ambulatory Visit: Admission: RE | Admit: 2021-02-16 | Payer: Self-pay | Source: Ambulatory Visit

## 2021-02-16 NOTE — Telephone Encounter (Signed)
Will contact pt to reschedule shared decision visit and CT. Refer to referral notes.

## 2021-11-11 ENCOUNTER — Ambulatory Visit (INDEPENDENT_AMBULATORY_CARE_PROVIDER_SITE_OTHER): Payer: Medicare Other | Admitting: Nurse Practitioner

## 2021-11-11 ENCOUNTER — Encounter (INDEPENDENT_AMBULATORY_CARE_PROVIDER_SITE_OTHER): Payer: Medicare Other

## 2021-11-17 ENCOUNTER — Other Ambulatory Visit (INDEPENDENT_AMBULATORY_CARE_PROVIDER_SITE_OTHER): Payer: Self-pay | Admitting: Nurse Practitioner

## 2021-11-17 DIAGNOSIS — I739 Peripheral vascular disease, unspecified: Secondary | ICD-10-CM

## 2021-11-18 ENCOUNTER — Ambulatory Visit (INDEPENDENT_AMBULATORY_CARE_PROVIDER_SITE_OTHER): Payer: Medicare Other | Admitting: Nurse Practitioner

## 2021-11-18 ENCOUNTER — Encounter (INDEPENDENT_AMBULATORY_CARE_PROVIDER_SITE_OTHER): Payer: Medicare Other

## 2022-02-25 ENCOUNTER — Encounter: Payer: Self-pay | Admitting: *Deleted

## 2023-09-12 NOTE — Progress Notes (Signed)
 This encounter was created in error - please disregard.
